# Patient Record
Sex: Female | Born: 1985
Health system: Southern US, Community
[De-identification: ages and names within clinical notes are randomized; demographics above are authoritative.]

## PROBLEM LIST (undated history)

## (undated) DIAGNOSIS — M2550 Pain in unspecified joint: Secondary | ICD-10-CM

## (undated) DIAGNOSIS — E669 Obesity, unspecified: Secondary | ICD-10-CM

## (undated) DIAGNOSIS — F329 Major depressive disorder, single episode, unspecified: Secondary | ICD-10-CM

## (undated) DIAGNOSIS — M7989 Other specified soft tissue disorders: Secondary | ICD-10-CM

## (undated) DIAGNOSIS — J302 Other seasonal allergic rhinitis: Secondary | ICD-10-CM

## (undated) DIAGNOSIS — J45909 Unspecified asthma, uncomplicated: Secondary | ICD-10-CM

## (undated) DIAGNOSIS — G4730 Sleep apnea, unspecified: Secondary | ICD-10-CM

## (undated) DIAGNOSIS — F419 Anxiety disorder, unspecified: Secondary | ICD-10-CM

## (undated) DIAGNOSIS — G43909 Migraine, unspecified, not intractable, without status migrainosus: Secondary | ICD-10-CM

## (undated) DIAGNOSIS — M549 Dorsalgia, unspecified: Secondary | ICD-10-CM

## (undated) DIAGNOSIS — F32A Depression, unspecified: Secondary | ICD-10-CM

## (undated) DIAGNOSIS — Z9289 Personal history of other medical treatment: Secondary | ICD-10-CM

## (undated) DIAGNOSIS — E559 Vitamin D deficiency, unspecified: Secondary | ICD-10-CM

## (undated) DIAGNOSIS — O021 Missed abortion: Secondary | ICD-10-CM

## (undated) DIAGNOSIS — D649 Anemia, unspecified: Secondary | ICD-10-CM

## (undated) HISTORY — DX: Pain in unspecified joint: M25.50

## (undated) HISTORY — DX: Vitamin D deficiency, unspecified: E55.9

## (undated) HISTORY — DX: Anxiety disorder, unspecified: F41.9

## (undated) HISTORY — DX: Other specified soft tissue disorders: M79.89

## (undated) HISTORY — DX: Obesity, unspecified: E66.9

## (undated) HISTORY — PX: WISDOM TOOTH EXTRACTION: SHX21

## (undated) HISTORY — DX: Dorsalgia, unspecified: M54.9

---

## 2011-04-13 ENCOUNTER — Inpatient Hospital Stay (HOSPITAL_COMMUNITY): Admission: AD | Admit: 2011-04-13 | Payer: Self-pay | Admitting: Obstetrics and Gynecology

## 2011-05-14 DIAGNOSIS — Z9289 Personal history of other medical treatment: Secondary | ICD-10-CM

## 2011-05-14 HISTORY — DX: Personal history of other medical treatment: Z92.89

## 2011-05-28 ENCOUNTER — Inpatient Hospital Stay (HOSPITAL_COMMUNITY)
Admission: AD | Admit: 2011-05-28 | Discharge: 2011-06-01 | DRG: 775 | Disposition: A | Discharge status: Home / Self Care | Payer: PRIVATE HEALTH INSURANCE | Source: Ambulatory Visit | Attending: Obstetrics and Gynecology | Admitting: Obstetrics and Gynecology

## 2011-05-28 ENCOUNTER — Encounter (HOSPITAL_COMMUNITY): Payer: Self-pay | Admitting: *Deleted

## 2011-05-28 DIAGNOSIS — O3660X0 Maternal care for excessive fetal growth, unspecified trimester, not applicable or unspecified: Secondary | ICD-10-CM | POA: Diagnosis present

## 2011-05-28 DIAGNOSIS — O9903 Anemia complicating the puerperium: Secondary | ICD-10-CM | POA: Diagnosis not present

## 2011-05-28 DIAGNOSIS — D62 Acute posthemorrhagic anemia: Secondary | ICD-10-CM | POA: Diagnosis not present

## 2011-05-28 DIAGNOSIS — O409XX0 Polyhydramnios, unspecified trimester, not applicable or unspecified: Secondary | ICD-10-CM | POA: Diagnosis present

## 2011-05-28 DIAGNOSIS — O139 Gestational [pregnancy-induced] hypertension without significant proteinuria, unspecified trimester: Principal | ICD-10-CM | POA: Diagnosis present

## 2011-05-28 LAB — CBC
Hemoglobin: 13.7 g/dL (ref 12.0–15.0)
MCH: 30 pg (ref 26.0–34.0)
MCHC: 33.8 g/dL (ref 30.0–36.0)
Platelets: 178 10*3/uL (ref 150–400)

## 2011-05-29 LAB — COMPREHENSIVE METABOLIC PANEL
ALT: 12 U/L (ref 0–35)
Calcium: 9.7 mg/dL (ref 8.4–10.5)
Creatinine, Ser: <0.47 mg/dL — ABNORMAL LOW (ref 0.50–1.10)
Glucose, Bld: 74 mg/dL (ref 70–99)
Sodium: 135 mEq/L (ref 135–145)
Total Protein: 6.1 g/dL (ref 6.0–8.3)

## 2011-05-29 LAB — CBC
MCV: 89.1 fL (ref 78.0–100.0)
Platelets: 169 10*3/uL (ref 150–400)
RBC: 4.41 MIL/uL (ref 3.87–5.11)
WBC: 14.1 10*3/uL — ABNORMAL HIGH (ref 4.0–10.5)

## 2011-05-29 LAB — RPR: RPR Ser Ql: NONREACTIVE

## 2011-05-30 LAB — CBC
HCT: 29.9 % — ABNORMAL LOW (ref 36.0–46.0)
Hemoglobin: 10 g/dL — ABNORMAL LOW (ref 12.0–15.0)
Hemoglobin: 8.7 g/dL — ABNORMAL LOW (ref 12.0–15.0)
MCH: 30.3 pg (ref 26.0–34.0)
MCV: 88.9 fL (ref 78.0–100.0)
Platelets: 149 10*3/uL — ABNORMAL LOW (ref 150–400)
RBC: 3.34 MIL/uL — ABNORMAL LOW (ref 3.87–5.11)
RBC: 2.87 MIL/uL — ABNORMAL LOW (ref 3.87–5.11)
WBC: 18 10*3/uL — ABNORMAL HIGH (ref 4.0–10.5)
WBC: 19 10*3/uL — ABNORMAL HIGH (ref 4.0–10.5)

## 2011-05-31 LAB — CBC
HCT: 19.9 % — ABNORMAL LOW (ref 36.0–46.0)
Hemoglobin: 6.8 g/dL — CL (ref 12.0–15.0)
Hemoglobin: 9 g/dL — ABNORMAL LOW (ref 12.0–15.0)
MCH: 30.7 pg (ref 26.0–34.0)
MCHC: 34.2 g/dL (ref 30.0–36.0)
MCHC: 34.2 g/dL (ref 30.0–36.0)
MCV: 89.8 fL (ref 78.0–100.0)
MCV: 90 fL (ref 78.0–100.0)
RBC: 2.93 MIL/uL — ABNORMAL LOW (ref 3.87–5.11)
RDW: 14 % (ref 11.5–15.5)
WBC: 14.2 10*3/uL — ABNORMAL HIGH (ref 4.0–10.5)

## 2011-06-01 LAB — CBC
HCT: 24.8 % — ABNORMAL LOW (ref 36.0–46.0)
MCHC: 33.5 g/dL (ref 30.0–36.0)
MCV: 90.8 fL (ref 78.0–100.0)
Platelets: 143 10*3/uL — ABNORMAL LOW (ref 150–400)
RDW: 14.4 % (ref 11.5–15.5)
WBC: 10.8 10*3/uL — ABNORMAL HIGH (ref 4.0–10.5)

## 2011-06-01 LAB — TYPE AND SCREEN
ABO/RH(D): O POS
Antibody Screen: NEGATIVE
Unit division: 0
Unit division: 0

## 2011-06-08 ENCOUNTER — Inpatient Hospital Stay (HOSPITAL_COMMUNITY): Admission: AD | Admit: 2011-06-08 | Payer: Self-pay | Source: Ambulatory Visit | Admitting: Obstetrics and Gynecology

## 2012-05-03 ENCOUNTER — Other Ambulatory Visit: Payer: Self-pay | Admitting: Physician Assistant

## 2012-12-13 DIAGNOSIS — O021 Missed abortion: Secondary | ICD-10-CM

## 2012-12-13 HISTORY — DX: Missed abortion: O02.1

## 2014-02-18 ENCOUNTER — Ambulatory Visit: Payer: Self-pay | Admitting: Family Medicine

## 2014-02-27 LAB — OB RESULTS CONSOLE HEPATITIS B SURFACE ANTIGEN
Hepatitis B Surface Ag: NEGATIVE
Hepatitis B Surface Ag: NEGATIVE

## 2014-02-27 LAB — OB RESULTS CONSOLE HIV ANTIBODY (ROUTINE TESTING)
HIV: NONREACTIVE
HIV: NONREACTIVE
HIV: NONREACTIVE

## 2014-02-27 LAB — OB RESULTS CONSOLE ANTIBODY SCREEN: ANTIBODY SCREEN: NEGATIVE

## 2014-02-27 LAB — OB RESULTS CONSOLE GC/CHLAMYDIA
CHLAMYDIA, DNA PROBE: NEGATIVE
GC PROBE AMP, GENITAL: NEGATIVE

## 2014-02-27 LAB — OB RESULTS CONSOLE ABO/RH: RH Type: POSITIVE

## 2014-02-27 LAB — OB RESULTS CONSOLE RPR
RPR: NONREACTIVE
RPR: NONREACTIVE

## 2014-02-27 LAB — OB RESULTS CONSOLE RUBELLA ANTIBODY, IGM: RUBELLA: IMMUNE

## 2014-09-09 ENCOUNTER — Other Ambulatory Visit: Payer: Self-pay | Admitting: Obstetrics and Gynecology

## 2014-09-23 ENCOUNTER — Encounter (HOSPITAL_COMMUNITY): Payer: Self-pay | Admitting: Pharmacist

## 2014-09-24 NOTE — Patient Instructions (Addendum)
   Your procedure is scheduled on:  Friday, Oct 16  [facility] at:  1130 AM Pick up the phone at the desk and dial [phone] and inform us of your arrival.  Please call this number if you have any problems the morning of surgery: [phone]  Remember: Do not eat food after midnight: tonight Do not drink clear liquids after: 9 AM Friday, day of surgery Take these medicines the morning of surgery with a SIP OF WATER:  Wellbutrin.  Bring albuterol inhaler with you on day of surgery.  Do not wear jewelry, make-up, or FINGER nail polish No metal in your hair or on your body. Do not wear lotions, powders, perfumes.  You may wear deodorant.  Do not bring valuables to the hospital. Contacts, dentures or bridgework may not be worn into surgery.  Leave suitcase in the car. After Surgery it may be brought to your room. For patients being admitted to the hospital, checkout time is 11:00am the day of discharge.  Home with husband Mardelle Mattendy cell [phone]

## 2014-09-26 ENCOUNTER — Encounter (HOSPITAL_COMMUNITY)
Admission: RE | Admit: 2014-09-26 | Discharge: 2014-09-26 | Disposition: A | Discharge status: Home / Self Care | Payer: 59 | Source: Ambulatory Visit | Attending: Obstetrics and Gynecology | Admitting: Obstetrics and Gynecology

## 2014-09-26 ENCOUNTER — Encounter (HOSPITAL_COMMUNITY): Payer: Self-pay

## 2014-09-26 HISTORY — DX: Personal history of other medical treatment: Z92.89

## 2014-09-26 HISTORY — DX: Missed abortion: O02.1

## 2014-09-26 HISTORY — DX: Depression, unspecified: F32.A

## 2014-09-26 HISTORY — DX: Anemia, unspecified: D64.9

## 2014-09-26 HISTORY — DX: Major depressive disorder, single episode, unspecified: F32.9

## 2014-09-26 HISTORY — DX: Unspecified asthma, uncomplicated: J45.909

## 2014-09-26 HISTORY — DX: Other seasonal allergic rhinitis: J30.2

## 2014-09-26 LAB — CBC
HCT: 33.9 % — ABNORMAL LOW (ref 36.0–46.0)
Hemoglobin: 10.4 g/dL — ABNORMAL LOW (ref 12.0–15.0)
MCH: 24.7 pg — ABNORMAL LOW (ref 26.0–34.0)
MCHC: 30.7 g/dL (ref 30.0–36.0)
MCV: 80.5 fL (ref 78.0–100.0)
PLATELETS: 221 10*3/uL (ref 150–400)
RBC: 4.21 MIL/uL (ref 3.87–5.11)
RDW: 15.8 % — ABNORMAL HIGH (ref 11.5–15.5)
WBC: 13 10*3/uL — ABNORMAL HIGH (ref 4.0–10.5)

## 2014-09-26 NOTE — Pre-Procedure Instructions (Signed)
SDS BB History Log given to Lab for patient's previous blood transfusion in 2012 per patient.

## 2014-09-27 ENCOUNTER — Encounter (HOSPITAL_COMMUNITY)
Admission: AD | Disposition: A | Discharge status: Home / Self Care | Payer: Self-pay | Source: Ambulatory Visit | Attending: Obstetrics and Gynecology

## 2014-09-27 ENCOUNTER — Encounter (HOSPITAL_COMMUNITY): Payer: 59 | Admitting: Anesthesiology

## 2014-09-27 ENCOUNTER — Inpatient Hospital Stay (HOSPITAL_COMMUNITY)
Admission: AD | Admit: 2014-09-27 | Discharge: 2014-09-29 | DRG: 765 | Disposition: A | Discharge status: Home / Self Care | Payer: 59 | Source: Ambulatory Visit | Attending: Obstetrics and Gynecology | Admitting: Obstetrics and Gynecology

## 2014-09-27 ENCOUNTER — Inpatient Hospital Stay (HOSPITAL_COMMUNITY): Payer: 59 | Admitting: Anesthesiology

## 2014-09-27 ENCOUNTER — Encounter (HOSPITAL_COMMUNITY): Payer: Self-pay | Admitting: Anesthesiology

## 2014-09-27 DIAGNOSIS — Z6841 Body Mass Index (BMI) 40.0 and over, adult: Secondary | ICD-10-CM

## 2014-09-27 DIAGNOSIS — O9952 Diseases of the respiratory system complicating childbirth: Secondary | ICD-10-CM | POA: Diagnosis present

## 2014-09-27 DIAGNOSIS — F329 Major depressive disorder, single episode, unspecified: Secondary | ICD-10-CM | POA: Diagnosis present

## 2014-09-27 DIAGNOSIS — J45909 Unspecified asthma, uncomplicated: Secondary | ICD-10-CM | POA: Diagnosis present

## 2014-09-27 DIAGNOSIS — O99214 Obesity complicating childbirth: Secondary | ICD-10-CM | POA: Diagnosis present

## 2014-09-27 DIAGNOSIS — Z3A39 39 weeks gestation of pregnancy: Secondary | ICD-10-CM | POA: Diagnosis present

## 2014-09-27 DIAGNOSIS — O99344 Other mental disorders complicating childbirth: Secondary | ICD-10-CM | POA: Diagnosis present

## 2014-09-27 DIAGNOSIS — O3663X0 Maternal care for excessive fetal growth, third trimester, not applicable or unspecified: Principal | ICD-10-CM | POA: Diagnosis present

## 2014-09-27 DIAGNOSIS — O9081 Anemia of the puerperium: Secondary | ICD-10-CM | POA: Diagnosis not present

## 2014-09-27 DIAGNOSIS — D62 Acute posthemorrhagic anemia: Secondary | ICD-10-CM | POA: Diagnosis not present

## 2014-09-27 DIAGNOSIS — O09893 Supervision of other high risk pregnancies, third trimester: Secondary | ICD-10-CM | POA: Diagnosis not present

## 2014-09-27 LAB — PREPARE RBC (CROSSMATCH)

## 2014-09-27 LAB — RPR

## 2014-09-27 SURGERY — Surgical Case
Anesthesia: Spinal | Site: Abdomen

## 2014-09-27 MED ORDER — OXYTOCIN 10 UNIT/ML IJ SOLN
INTRAMUSCULAR | Status: AC
  Filled 2014-09-27: qty 4

## 2014-09-27 MED ORDER — NALBUPHINE HCL 10 MG/ML IJ SOLN: 5.0000 mg | Freq: Once | INTRAMUSCULAR | Status: AC | PRN

## 2014-09-27 MED ORDER — LACTATED RINGERS IV SOLN
Freq: Once | INTRAVENOUS | Status: AC
  Administered 2014-09-27: 12:00:00 via INTRAVENOUS

## 2014-09-27 MED ORDER — ONDANSETRON HCL 4 MG/2ML IJ SOLN
INTRAMUSCULAR | Status: DC | PRN
  Administered 2014-09-27: 4 mg via INTRAVENOUS

## 2014-09-27 MED ORDER — KETOROLAC TROMETHAMINE 30 MG/ML IJ SOLN
15.0000 mg | Freq: Once | INTRAMUSCULAR | Status: AC | PRN
  Administered 2014-09-27: 30 mg via INTRAVENOUS

## 2014-09-27 MED ORDER — NALBUPHINE HCL 10 MG/ML IJ SOLN: 5.0000 mg | INTRAMUSCULAR | Status: DC | PRN

## 2014-09-27 MED ORDER — DIPHENHYDRAMINE HCL 25 MG PO CAPS
25.0000 mg | ORAL_CAPSULE | ORAL | Status: DC | PRN
  Administered 2014-09-28: 25 mg via ORAL
  Filled 2014-09-27: qty 1

## 2014-09-27 MED ORDER — NALOXONE HCL 1 MG/ML IJ SOLN
1.0000 ug/kg/h | INTRAVENOUS | Status: DC | PRN
  Filled 2014-09-27: qty 2

## 2014-09-27 MED ORDER — DIBUCAINE 1 % RE OINT: 1.0000 "application " | TOPICAL_OINTMENT | RECTAL | Status: DC | PRN

## 2014-09-27 MED ORDER — SIMETHICONE 80 MG PO CHEW
80.0000 mg | CHEWABLE_TABLET | Freq: Three times a day (TID) | ORAL | Status: DC
  Administered 2014-09-28 – 2014-09-29 (×5): 80 mg via ORAL
  Filled 2014-09-27 (×5): qty 1

## 2014-09-27 MED ORDER — ONDANSETRON HCL 4 MG/2ML IJ SOLN: 4.0000 mg | INTRAMUSCULAR | Status: DC | PRN

## 2014-09-27 MED ORDER — IBUPROFEN 600 MG PO TABS
600.0000 mg | ORAL_TABLET | Freq: Four times a day (QID) | ORAL | Status: DC
  Administered 2014-09-28 – 2014-09-29 (×6): 600 mg via ORAL
  Filled 2014-09-27 (×7): qty 1

## 2014-09-27 MED ORDER — SODIUM CHLORIDE 0.9 % IJ SOLN: 3.0000 mL | Freq: Two times a day (BID) | INTRAMUSCULAR | Status: DC

## 2014-09-27 MED ORDER — SODIUM CHLORIDE 0.9 % IJ SOLN: 3.0000 mL | INTRAMUSCULAR | Status: DC | PRN

## 2014-09-27 MED ORDER — DIPHENHYDRAMINE HCL 25 MG PO CAPS: 25.0000 mg | ORAL_CAPSULE | Freq: Four times a day (QID) | ORAL | Status: DC | PRN

## 2014-09-27 MED ORDER — LANOLIN HYDROUS EX OINT: 1.0000 "application " | TOPICAL_OINTMENT | CUTANEOUS | Status: DC | PRN

## 2014-09-27 MED ORDER — SIMETHICONE 80 MG PO CHEW: 80.0000 mg | CHEWABLE_TABLET | ORAL | Status: DC | PRN

## 2014-09-27 MED ORDER — ONDANSETRON HCL 4 MG/2ML IJ SOLN
INTRAMUSCULAR | Status: AC
  Filled 2014-09-27: qty 2

## 2014-09-27 MED ORDER — ONDANSETRON HCL 4 MG/2ML IJ SOLN: 4.0000 mg | Freq: Three times a day (TID) | INTRAMUSCULAR | Status: DC | PRN

## 2014-09-27 MED ORDER — NALBUPHINE HCL 10 MG/ML IJ SOLN
5.0000 mg | INTRAMUSCULAR | Status: DC | PRN
Start: 2014-09-27 — End: 2014-09-28
  Administered 2014-09-27: 5 mg via INTRAVENOUS
  Filled 2014-09-27: qty 1

## 2014-09-27 MED ORDER — BISACODYL 10 MG RE SUPP: 10.0000 mg | Freq: Every day | RECTAL | Status: DC | PRN

## 2014-09-27 MED ORDER — METHYLERGONOVINE MALEATE 0.2 MG/ML IJ SOLN: 0.2000 mg | INTRAMUSCULAR | Status: DC | PRN

## 2014-09-27 MED ORDER — PHENYLEPHRINE 8 MG IN D5W 100 ML (0.08MG/ML) PREMIX OPTIME
INJECTION | INTRAVENOUS | Status: DC | PRN
  Administered 2014-09-27: 60 ug/min via INTRAVENOUS

## 2014-09-27 MED ORDER — 0.9 % SODIUM CHLORIDE (POUR BTL) OPTIME
TOPICAL | Status: DC | PRN
  Administered 2014-09-27: 1000 mL

## 2014-09-27 MED ORDER — HYDROMORPHONE HCL 1 MG/ML IJ SOLN
INTRAMUSCULAR | Status: AC
  Administered 2014-09-27: 0.5 mg via INTRAVENOUS
  Filled 2014-09-27: qty 1

## 2014-09-27 MED ORDER — HYDROMORPHONE HCL 1 MG/ML IJ SOLN
0.2500 mg | INTRAMUSCULAR | Status: DC | PRN
  Administered 2014-09-27: 0.5 mg via INTRAVENOUS

## 2014-09-27 MED ORDER — KETOROLAC TROMETHAMINE 30 MG/ML IJ SOLN
INTRAMUSCULAR | Status: AC
  Administered 2014-09-27: 30 mg via INTRAVENOUS
  Filled 2014-09-27: qty 1

## 2014-09-27 MED ORDER — OXYCODONE-ACETAMINOPHEN 5-325 MG PO TABS: 2.0000 | ORAL_TABLET | ORAL | Status: DC | PRN

## 2014-09-27 MED ORDER — PRENATAL MULTIVITAMIN CH
1.0000 | ORAL_TABLET | Freq: Every day | ORAL | Status: DC
  Administered 2014-09-28 – 2014-09-29 (×2): 1 via ORAL
  Filled 2014-09-27 (×2): qty 1

## 2014-09-27 MED ORDER — LACTATED RINGERS IV SOLN
INTRAVENOUS | Status: DC | PRN
  Administered 2014-09-27 (×3): via INTRAVENOUS

## 2014-09-27 MED ORDER — DEXTROSE 5 % IV SOLN
3.0000 g | INTRAVENOUS | Status: DC | PRN
  Administered 2014-09-27: 3 g via INTRAVENOUS

## 2014-09-27 MED ORDER — FENTANYL CITRATE 0.05 MG/ML IJ SOLN
INTRAMUSCULAR | Status: AC
  Filled 2014-09-27: qty 2

## 2014-09-27 MED ORDER — SIMETHICONE 80 MG PO CHEW
80.0000 mg | CHEWABLE_TABLET | ORAL | Status: DC
  Administered 2014-09-28 (×2): 80 mg via ORAL
  Filled 2014-09-27 (×2): qty 1

## 2014-09-27 MED ORDER — SCOPOLAMINE 1 MG/3DAYS TD PT72
1.0000 | MEDICATED_PATCH | Freq: Once | TRANSDERMAL | Status: DC
  Administered 2014-09-27: 1.5 mg via TRANSDERMAL

## 2014-09-27 MED ORDER — METHYLERGONOVINE MALEATE 0.2 MG PO TABS: 0.2000 mg | ORAL_TABLET | ORAL | Status: DC | PRN

## 2014-09-27 MED ORDER — OXYTOCIN 40 UNITS IN LACTATED RINGERS INFUSION - SIMPLE MED: 62.5000 mL/h | INTRAVENOUS | Status: AC

## 2014-09-27 MED ORDER — KETOROLAC TROMETHAMINE 30 MG/ML IJ SOLN: 30.0000 mg | Freq: Four times a day (QID) | INTRAMUSCULAR | Status: DC | PRN

## 2014-09-27 MED ORDER — BUPIVACAINE HCL (PF) 0.25 % IJ SOLN
INTRAMUSCULAR | Status: AC
  Filled 2014-09-27: qty 30

## 2014-09-27 MED ORDER — BUPIVACAINE HCL (PF) 0.25 % IJ SOLN
INTRAMUSCULAR | Status: DC | PRN
  Administered 2014-09-27: 10 mL

## 2014-09-27 MED ORDER — SODIUM CHLORIDE 0.9 % IV SOLN: 250.0000 mL | INTRAVENOUS | Status: DC

## 2014-09-27 MED ORDER — NALOXONE HCL 0.4 MG/ML IJ SOLN: 0.4000 mg | INTRAMUSCULAR | Status: DC | PRN

## 2014-09-27 MED ORDER — OXYCODONE-ACETAMINOPHEN 5-325 MG PO TABS
1.0000 | ORAL_TABLET | ORAL | Status: DC | PRN
  Administered 2014-09-28 – 2014-09-29 (×2): 1 via ORAL
  Filled 2014-09-27 (×2): qty 1

## 2014-09-27 MED ORDER — FERROUS SULFATE 325 (65 FE) MG PO TABS
325.0000 mg | ORAL_TABLET | Freq: Two times a day (BID) | ORAL | Status: DC
  Administered 2014-09-28 – 2014-09-29 (×3): 325 mg via ORAL
  Filled 2014-09-27 (×3): qty 1

## 2014-09-27 MED ORDER — LACTATED RINGERS IV SOLN
INTRAVENOUS | Status: DC | PRN
  Administered 2014-09-27: 14:00:00 via INTRAVENOUS

## 2014-09-27 MED ORDER — MEPERIDINE HCL 25 MG/ML IJ SOLN: 6.2500 mg | INTRAMUSCULAR | Status: DC | PRN

## 2014-09-27 MED ORDER — ALBUTEROL SULFATE (2.5 MG/3ML) 0.083% IN NEBU: 3.0000 mL | INHALATION_SOLUTION | Freq: Four times a day (QID) | RESPIRATORY_TRACT | Status: DC | PRN

## 2014-09-27 MED ORDER — FLEET ENEMA 7-19 GM/118ML RE ENEM: 1.0000 | ENEMA | Freq: Every day | RECTAL | Status: DC | PRN

## 2014-09-27 MED ORDER — ZOLPIDEM TARTRATE 5 MG PO TABS: 5.0000 mg | ORAL_TABLET | Freq: Every evening | ORAL | Status: DC | PRN

## 2014-09-27 MED ORDER — WITCH HAZEL-GLYCERIN EX PADS
1.0000 | MEDICATED_PAD | CUTANEOUS | Status: DC | PRN
Start: 2014-09-27 — End: 2014-09-29

## 2014-09-27 MED ORDER — MORPHINE SULFATE (PF) 0.5 MG/ML IJ SOLN
INTRAMUSCULAR | Status: DC | PRN
  Administered 2014-09-27: .15 mg via INTRATHECAL

## 2014-09-27 MED ORDER — PHENYLEPHRINE HCL 10 MG/ML IJ SOLN
INTRAMUSCULAR | Status: AC
  Filled 2014-09-27: qty 1

## 2014-09-27 MED ORDER — PROMETHAZINE HCL 25 MG/ML IJ SOLN
INTRAMUSCULAR | Status: AC
  Filled 2014-09-27: qty 1

## 2014-09-27 MED ORDER — MENTHOL 3 MG MT LOZG: 1.0000 | LOZENGE | OROMUCOSAL | Status: DC | PRN

## 2014-09-27 MED ORDER — IBUPROFEN 600 MG PO TABS: 600.0000 mg | ORAL_TABLET | Freq: Four times a day (QID) | ORAL | Status: DC | PRN

## 2014-09-27 MED ORDER — ONDANSETRON HCL 4 MG PO TABS: 4.0000 mg | ORAL_TABLET | ORAL | Status: DC | PRN

## 2014-09-27 MED ORDER — FENTANYL CITRATE 0.05 MG/ML IJ SOLN
INTRAMUSCULAR | Status: DC | PRN
  Administered 2014-09-27: 25 ug via INTRATHECAL

## 2014-09-27 MED ORDER — DIPHENHYDRAMINE HCL 50 MG/ML IJ SOLN: 12.5000 mg | INTRAMUSCULAR | Status: DC | PRN

## 2014-09-27 MED ORDER — DEXTROSE 5 % IV SOLN
3.0000 g | INTRAVENOUS | Status: DC
  Filled 2014-09-27: qty 3000

## 2014-09-27 MED ORDER — OXYTOCIN 40 UNITS IN LACTATED RINGERS INFUSION - SIMPLE MED
INTRAVENOUS | Status: DC | PRN
  Administered 2014-09-27: 40 [IU] via INTRAVENOUS

## 2014-09-27 MED ORDER — SENNOSIDES-DOCUSATE SODIUM 8.6-50 MG PO TABS
2.0000 | ORAL_TABLET | ORAL | Status: DC
  Administered 2014-09-28: 2 via ORAL
  Filled 2014-09-27 (×2): qty 2

## 2014-09-27 MED ORDER — DEXTROSE IN LACTATED RINGERS 5 % IV SOLN
INTRAVENOUS | Status: DC
  Administered 2014-09-27: 18:00:00 via INTRAVENOUS
  Administered 2014-09-28: 1000 mL via INTRAVENOUS

## 2014-09-27 MED ORDER — BUPROPION HCL ER (XL) 300 MG PO TB24
300.0000 mg | ORAL_TABLET | Freq: Every day | ORAL | Status: DC
  Administered 2014-09-28 – 2014-09-29 (×2): 300 mg via ORAL
  Filled 2014-09-27 (×3): qty 1

## 2014-09-27 MED ORDER — MORPHINE SULFATE 0.5 MG/ML IJ SOLN
INTRAMUSCULAR | Status: AC
  Filled 2014-09-27: qty 10

## 2014-09-27 MED ORDER — SCOPOLAMINE 1 MG/3DAYS TD PT72
MEDICATED_PATCH | TRANSDERMAL | Status: AC
  Administered 2014-09-27: 1.5 mg via TRANSDERMAL
  Filled 2014-09-27: qty 1

## 2014-09-27 MED ORDER — KETOROLAC TROMETHAMINE 30 MG/ML IJ SOLN
30.0000 mg | Freq: Four times a day (QID) | INTRAMUSCULAR | Status: DC | PRN
Start: 2014-09-27 — End: 2014-09-27

## 2014-09-27 MED ORDER — PROMETHAZINE HCL 25 MG/ML IJ SOLN
6.2500 mg | INTRAMUSCULAR | Status: DC | PRN
  Administered 2014-09-27: 12.5 mg via INTRAVENOUS

## 2014-09-27 MED ORDER — BUPIVACAINE IN DEXTROSE 0.75-8.25 % IT SOLN
INTRATHECAL | Status: DC | PRN
  Administered 2014-09-27: 1.5 mL via INTRATHECAL

## 2014-09-27 SURGICAL SUPPLY — 42 items
BARRIER ADHS 3X4 INTERCEED (GAUZE/BANDAGES/DRESSINGS) ×3 IMPLANT
BENZOIN TINCTURE PRP APPL 2/3 (GAUZE/BANDAGES/DRESSINGS) IMPLANT
CLAMP CORD UMBIL (MISCELLANEOUS) ×3 IMPLANT
CLOSURE WOUND 1/2 X4 (GAUZE/BANDAGES/DRESSINGS)
CLOTH BEACON ORANGE TIMEOUT ST (SAFETY) ×3 IMPLANT
CONTAINER PREFILL 10% NBF 15ML (MISCELLANEOUS) IMPLANT
DRAPE SHEET LG 3/4 BI-LAMINATE (DRAPES) ×3 IMPLANT
DRSG OPSITE POSTOP 4X10 (GAUZE/BANDAGES/DRESSINGS) ×3 IMPLANT
DURAPREP 26ML APPLICATOR (WOUND CARE) ×3 IMPLANT
ELECT REM PT RETURN 9FT ADLT (ELECTROSURGICAL) ×3
ELECTRODE REM PT RTRN 9FT ADLT (ELECTROSURGICAL) ×1 IMPLANT
EXTRACTOR VACUUM M CUP 4 TUBE (SUCTIONS) IMPLANT
EXTRACTOR VACUUM M CUP 4' TUBE (SUCTIONS)
GLOVE BIOGEL PI IND STRL 7.0 (GLOVE) ×1 IMPLANT
GLOVE BIOGEL PI INDICATOR 7.0 (GLOVE) ×2
GLOVE ECLIPSE 6.5 STRL STRAW (GLOVE) ×3 IMPLANT
GOWN STRL REUS W/TWL LRG LVL3 (GOWN DISPOSABLE) ×6 IMPLANT
KIT ABG SYR 3ML LUER SLIP (SYRINGE) IMPLANT
NEEDLE HYPO 25X1 1.5 SAFETY (NEEDLE) ×3 IMPLANT
NEEDLE HYPO 25X5/8 SAFETYGLIDE (NEEDLE) IMPLANT
NS IRRIG 1000ML POUR BTL (IV SOLUTION) ×3 IMPLANT
PACK C SECTION WH (CUSTOM PROCEDURE TRAY) ×3 IMPLANT
PAD OB MATERNITY 4.3X12.25 (PERSONAL CARE ITEMS) ×3 IMPLANT
RTRCTR C-SECT PINK 25CM LRG (MISCELLANEOUS) ×3 IMPLANT
SPONGE LAP 4X18 X RAY DECT (DISPOSABLE) ×3 IMPLANT
STAPLER VISISTAT 35W (STAPLE) ×3 IMPLANT
STRIP CLOSURE SKIN 1/2X4 (GAUZE/BANDAGES/DRESSINGS) IMPLANT
SUT CHROMIC GUT AB #0 18 (SUTURE) IMPLANT
SUT MNCRL 0 VIOLET CTX 36 (SUTURE) ×5 IMPLANT
SUT MON AB 4-0 PS1 27 (SUTURE) IMPLANT
SUT MONOCRYL 0 CTX 36 (SUTURE) ×10
SUT PLAIN 2 0 (SUTURE)
SUT PLAIN 2 0 XLH (SUTURE) ×3 IMPLANT
SUT PLAIN ABS 2-0 CT1 27XMFL (SUTURE) IMPLANT
SUT VIC AB 0 CT1 36 (SUTURE) ×6 IMPLANT
SUT VIC AB 2-0 CT1 27 (SUTURE) ×2
SUT VIC AB 2-0 CT1 TAPERPNT 27 (SUTURE) ×1 IMPLANT
SUT VIC AB 4-0 PS2 27 (SUTURE) IMPLANT
SYR CONTROL 10ML LL (SYRINGE) ×3 IMPLANT
TOWEL OR 17X24 6PK STRL BLUE (TOWEL DISPOSABLE) ×3 IMPLANT
TRAY FOLEY CATH 14FR (SET/KITS/TRAYS/PACK) ×3 IMPLANT
WATER STERILE IRR 1000ML POUR (IV SOLUTION) IMPLANT

## 2014-09-27 NOTE — Transfer of Care (Signed)
Immediate Anesthesia Transfer of Care Note  Patient: Kim Garcia  Procedure(s) Performed: Procedure(s) with comments: Primary CESAREAN SECTION (N/A) - EDD: 10/04/14  Patient Location: PACU  Anesthesia Type:Spinal  Level of Consciousness: awake, alert  and oriented  Airway & Oxygen Therapy: Patient Spontanous Breathing  Post-op Assessment: Report given to PACU RN and Post -op Vital signs reviewed and stable  Post vital signs: Reviewed and stable  Complications: No apparent anesthesia complications

## 2014-09-27 NOTE — Anesthesia Procedure Notes (Signed)
Spinal  Patient location during procedure: OR Start time: 09/27/2014 1:25 PM Staffing Anesthesiologist: Brayton CavesJACKSON, Reisa Coppola Performed by: anesthesiologist  Preanesthetic Checklist Completed: patient identified, site marked, surgical consent, pre-op evaluation, timeout performed, IV checked, risks and benefits discussed and monitors and equipment checked Spinal Block Patient position: sitting Prep: DuraPrep Patient monitoring: heart rate, cardiac monitor, continuous pulse ox and blood pressure Approach: midline Location: L3-4 Injection technique: single-shot Needle Needle type: Sprotte  Needle gauge: 24 G Needle length: 9 cm Assessment Sensory level: T4 Additional Notes Patient identified.  Risk benefits discussed including failed block, incomplete pain control, headache, nerve damage, paralysis, blood pressure changes, nausea, vomiting, reactions to medication both toxic or allergic, and postpartum back pain.  Patient expressed understanding and wished to proceed.  All questions were answered.  Sterile technique used throughout procedure.  CSF was clear.  No parasthesia or other complications.  Please see nursing notes for vital signs.

## 2014-09-27 NOTE — Lactation Note (Signed)
This note was copied from the chart of Kim Garcia Schou. Lactation Consultation Note Initial visit at 9 hours of age. Mom reports having difficulty with latching on right breast that is flat.  Hand pump used to evert nipple with colostrum visible.  Baby STS after bath and sleepy.  Baby did not wake for latch attempt.  [facility] given and discussed.  Encouraged to feed with early cues on demand.  Early newborn behavior discussed.  Mom to call for assist as needed.    Patient Name: Kim Garcia Somers ZOXWR'UToday's Date: 09/27/2014 Reason for consult: Initial assessment   Maternal Data Has patient been taught Hand Expression?: Yes Does the patient have breastfeeding experience prior to this delivery?: Yes  Feeding Feeding Type: Breast Fed Length of feed: 20 min  LATCH Score/Interventions Latch: Grasps breast easily, tongue down, lips flanged, rhythmical sucking.  Audible Swallowing: A few with stimulation Intervention(s): Skin to skin  Type of Nipple: Everted at rest and after stimulation  Comfort (Breast/Nipple): Soft / non-tender     Hold (Positioning): No assistance needed to correctly position infant at breast.  LATCH Score: 9  Lactation Tools Discussed/Used     Consult Status Consult Status: Follow-up Date: 09/28/14 Follow-up type: In-patient    Jannifer RodneyShoptaw, Chivon Lepage Lynn 09/27/2014, 11:03 PM

## 2014-09-27 NOTE — Anesthesia Postprocedure Evaluation (Signed)
  Anesthesia Post Note  Patient: Kim Garcia  Procedure(s) Performed: Procedure(s) (LRB): Primary CESAREAN SECTION (N/A)  Anesthesia type: Spinal  Patient location: PACU  Post pain: Pain level controlled  Post assessment: Post-op Vital signs reviewed  Last Vitals:  Filed Vitals:   09/27/14 1515  BP: 105/45  Pulse: 87  Temp:   Resp: 20    Post vital signs: Reviewed  Level of consciousness: awake  Complications: No apparent anesthesia complications

## 2014-09-27 NOTE — Brief Op Note (Signed)
09/27/2014  3:02 PM  PATIENT:  Kim Garcia  28 y.o. female  PRE-OPERATIVE DIAGNOSIS:  Previous 4th degree laceration, Large for Gestational Age  POST-OPERATIVE DIAGNOSIS:  Previous 4th degree laceration, fetal macrosomia, term gestation  PROCEDURE:  Primary Cesarean section, kerr hysterotomy  SURGEON:  Surgeon(s) and Role:    * Whitni Pasquini Cathie BeamsA Miklos Bidinger, MD - Primary  PHYSICIAN ASSISTANT:   ASSISTANTS: Donette LarryMelanie Bhambri, CNM   ANESTHESIA:   spinal FINDINGS: Live female 9lb 2 oz, nl tubes, polycystic ovaries EBL:  Total I/O In: 3200 [I.V.:3200] Out: 1200 [Urine:400; Blood:800]  BLOOD ADMINISTERED:none  DRAINS: none   LOCAL MEDICATIONS USED:  MARCAINE     SPECIMEN:  Source of Specimen:  placenta  DISPOSITION OF SPECIMEN:  N/A  COUNTS:  YES  TOURNIQUET:  * No tourniquets in log *  DICTATION: .Other Dictation: Dictation Number [id number]  PLAN OF CARE: Admit to inpatient   PATIENT DISPOSITION:  PACU - hemodynamically stable.   Delay start of Pharmacological VTE agent (>24hrs) due to surgical blood loss or risk of bleeding: no

## 2014-09-27 NOTE — Anesthesia Preprocedure Evaluation (Signed)
Anesthesia Evaluation  Patient identified by MRN, date of birth, ID band Patient awake    Reviewed: Allergy & Precautions, H&P , NPO status , Patient's Chart, lab work & pertinent test results  Airway Mallampati: III TM Distance: >3 FB Neck ROM: full    Dental no notable dental hx.    Pulmonary    Pulmonary exam normal       Cardiovascular negative cardio ROS      Neuro/Psych Depression negative neurological ROS     GI/Hepatic negative GI ROS, Neg liver ROS,   Endo/Other  negative endocrine ROSMorbid obesity  Renal/GU negative Renal ROS     Musculoskeletal   Abdominal (+) + obese,   Peds  Hematology   Anesthesia Other Findings   Reproductive/Obstetrics (+) Pregnancy                           Anesthesia Physical Anesthesia Plan  ASA: III  Anesthesia Plan: Spinal   Post-op Pain Management:    Induction:   Airway Management Planned:   Additional Equipment:   Intra-op Plan:   Post-operative Plan:   Informed Consent: I have reviewed the patients History and Physical, chart, labs and discussed the procedure including the risks, benefits and alternatives for the proposed anesthesia with the patient or authorized representative who has indicated his/her understanding and acceptance.     Plan Discussed with: CRNA, Anesthesiologist and Surgeon  Anesthesia Plan Comments:         Anesthesia Quick Evaluation

## 2014-09-28 ENCOUNTER — Encounter (HOSPITAL_COMMUNITY): Payer: Self-pay | Admitting: *Deleted

## 2014-09-28 LAB — CBC
HEMATOCRIT: 27.9 % — AB (ref 36.0–46.0)
HEMOGLOBIN: 8.5 g/dL — AB (ref 12.0–15.0)
MCH: 25.4 pg — AB (ref 26.0–34.0)
MCHC: 30.8 g/dL (ref 30.0–36.0)
MCV: 82.3 fL (ref 78.0–100.0)
Platelets: 171 10*3/uL (ref 150–400)
RBC: 3.39 MIL/uL — ABNORMAL LOW (ref 3.87–5.11)
RDW: 16 % — ABNORMAL HIGH (ref 11.5–15.5)
WBC: 11.2 10*3/uL — ABNORMAL HIGH (ref 4.0–10.5)

## 2014-09-28 LAB — BIRTH TISSUE RECOVERY COLLECTION (PLACENTA DONATION)

## 2014-09-28 LAB — CCBB MATERNAL DONOR DRAW

## 2014-09-28 MED ORDER — MAGNESIUM OXIDE 400 (241.3 MG) MG PO TABS
200.0000 mg | ORAL_TABLET | Freq: Every day | ORAL | Status: DC
  Administered 2014-09-28 – 2014-09-29 (×2): 200 mg via ORAL
  Filled 2014-09-28 (×3): qty 0.5

## 2014-09-28 NOTE — Addendum Note (Signed)
Addendum created 09/28/14 0814 by Renford DillsJanet L Austina Constantin, CRNA   Modules edited: Notes Section   Notes Section:  File: [id number]

## 2014-09-28 NOTE — Progress Notes (Signed)
S/p primary C/S POD #1   S: Feeling okay, but tired      Pain controlled with Motrin      No nausea, vomiting      Occasional dizziness when first getting out of bed       Up ad lib/ambulating in room/voiding adequately      Bleeding is light      No flatus yet, but feels gas pains      Breastfeeding  O:   Blood pressure 103/52, pulse 82, temperature 97.9 F (36.6 C), temperature source Oral, resp. rate 18, weight 116.121 kg (256 lb), last menstrual period 12/18/2013, SpO2 95.00%, unknown if currently breastfeeding.  Intake/Output: Net: +390  Rubella - Immune Flu - received Tdap- Received   Physical Exam: General: alert, oriented x 4 Heart: regular rate, rhythm Lungs: clear, equal bilaterally Abdomen: distended, tender to palpation, hypoactive bowel sounds Incision: honeycomb in place, dry, intact, small brown dried blood / nurse noted on dressing Fundus: firm, U-1 difficulty palpated due to habitus Perineum: intact Lochia: scant, bright red Extremities: +2 edema in BLE; no calf pain or tenderness or swelling  A: 1. S/p primary C/S - macrosomia - baby girl  2. ABL Anemia - on Ferrous Sulfate BID 3. Depression - Wellbutrin 300mg  daily 4. Obesity   P: Continue routine postpartum orders Begin Magnesium Oxide 200mg  daily Encouraged ambulating and IS  Discussed breastfeeding, hand massage and hand expression before latching  Milinda CaveMeredith Causey, SNM Marlinda Mikeanya Morry Veiga CNM / Asa LenteFACNM

## 2014-09-28 NOTE — Lactation Note (Signed)
This note was copied from the chart of Girl Jake SharkSara Mano. Lactation Consultation Note  Patient Name: Girl Jake SharkSara Hallett RUEAV'WToday's Date: 09/28/2014 Reason for consult: Follow-up assessment;Difficult latch (difficult latch on (R) breast)  Mom had been having latch difficulty on (R) and was provided with a #24 NS by her nurse, as well as a DEBP to use for additional stimulation and to help evert this nipple more prior to latch.  Mom has obtained about 10 ml's on (R) after breastfeeding and FOB will finger-feed ebm with curved-tip syringe.  Both parents verbalize understanding of plan and use of tools.  Mom will continue cue feedings on both breasts, wear shells between feedings, pre and post-pump as needed until baby latching well on both sides and no longer needing NS or supplement.  Maternal Data    Feeding Length of feed: 5 min  LATCH Score/Interventions Latch: Repeated attempts needed to sustain latch, nipple held in mouth throughout feeding, stimulation needed to elicit sucking reflex.  Audible Swallowing: A few with stimulation  Type of Nipple: Flat Intervention(s): Shells (NS)  Comfort (Breast/Nipple): Soft / non-tender     Hold (Positioning): Assistance needed to correctly position infant at breast and maintain latch.  LATCH Score: 6 (most recent LATCH score on difficult (R) breast, per RN assessment)  Lactation Tools Discussed/Used Tools: Nipple Shields Nipple shield size: 24 DEBP Milk storage (page 25 in Baby and Me) Cue feedings  Consult Status Consult Status: Follow-up Date: 09/29/14 Follow-up type: In-patient    Warrick ParisianBryant, Casie Sturgeon [facility] 09/28/2014, 8:47 PM

## 2014-09-28 NOTE — Op Note (Signed)
NAMJake Shark:  Garcia, Kim Garcia                   ACCOUNT NO.:  [id number]  MEDICAL RECORD NO.:  [id number]  LOCATION:  9141                          FACILITY:  [facility]  PHYSICIAN:  Kim Garcia, M.D.DATE OF BIRTH:  08-29-1986  DATE OF PROCEDURE:  09/27/2014 DATE OF DISCHARGE:                              OPERATIVE REPORT   PREOPERATIVE DIAGNOSIS:  Previous 4th degree laceration.  Large for gestational age baby.  Term gestation.  POSTOPERATIVE DIAGNOSIS:  Previous 4th degree laceration.  Fetal macrosomia.  Term gestation.  PROCEDURES:  Primary cesarean section, Kerr hysterotomy.  ANESTHESIA:  Spinal.  SURGEON:  Kim Garcia, M.D.  ASSISTANT:  Ricki MillerMaloney Bhambri, CNM.  DESCRIPTION OF PROCEDURE:  Under adequate spinal anesthesia, the patient was placed in the supine position with a left lateral tilt.  She was sterilely prepped and draped in usual fashion.  An indwelling Foley catheter was sterilely placed.  A 0.25% Marcaine was injected along the planned Pfannenstiel skin incision site.  Pfannenstiel skin incision was then carried down to the rectus fascia.  Rectus fascia was then bluntly and sharply dissected off the rectus muscle in superior and inferior fashion.  The rectus muscle was split in the midline.  The parietal peritoneum was ultimately entered bluntly.  This incision was extended superiorly and inferiorly.  An Alexis retractor was then subsequently placed.  The vesicouterine peritoneum was then opened transversely.  The bladder was sharply dissected off the lower uterine segment and displaced inferiorly with a bladder retractor.  It was then noted that there was bevy of the vessels were noted in the lower uterine segment. Nonetheless, the low transverse uterine incision was then made and extended with bandage scissors.  Artificial rupture of membranes, clear copious amniotic fluid.  Floating vertex was encountered.  Subsequent, loop of cord and the rupture of membranes came  down.  The cord was pushed back into the uterine cavity and subsequent delivery of a live female was accomplished.  Cord was around the neck as well, which was reduced.  Cord was clamped and cut.  The baby was bulb suctioned in the abdomen.  The  baby was transferred to the awaiting pediatrician with Apgars 8 and 9 at 1 and 5 minutes.  The placenta which was posterior was manually removed.  Uterine cavity was cleaned of debris.  Uterine cavity had no extension, it was closed in 2 layers, the first layer with 0 Monocryl in a running locked stitch, second layer was imbricated using 0 Monocryl suture.  Bleeding from those prior lower uterine segment vessels were noted prior to the hysterotomy required figure-of-eight 0 Vicryl sutures were placed  in order to facilitate hemostasis.  Once hemostasis was achieved, the abdomen was irrigated and suctioned.  Interceed was placed over the lower uterine segment.  The Jon Gillslexis was removed.  The parietal peritoneum was closed with 2-0 Vicryl. The rectus fascia with 0 Vicryl x2.  The subcutaneous area which was deep was irrigated, suction, and small bleeders cauterized.   Interrupted 2-0 plain sutures were then placed. The skin was approximated with Ethicon staples.  SPECIMEN:  Placenta not sent to Pathology.  ESTIMATED BLOOD LOSS:  Was 800.  URINE  OUTPUT:  400 mL clear yellow urine.  INTRAOPERATIVE FLUID:  3200 mL.  COUNTS:  Sponge and instrument counts x2 was correct.  COMPLICATIONS:  None.  The weight of the baby was 9 pounds 2 ounces.  The baby was transferred to the recovery room in stable condition along with mom who was able to do skin to skin.     Kim Garcia, M.D.     [place]/MEDQ  D:  09/27/2014  T:  09/28/2014  Job:  [id number]

## 2014-09-28 NOTE — Anesthesia Postprocedure Evaluation (Signed)
  Anesthesia Post-op Note  Patient: Kim Garcia  Procedure(s) Performed: Procedure(s) with comments: Primary CESAREAN SECTION (N/A) - EDD: 10/04/14  Patient Location: PACU and Mother/Baby  Anesthesia Type:Spinal  Level of Consciousness: awake  Airway and Oxygen Therapy: Patient Spontanous Breathing  Post-op Pain: mild  Post-op Assessment: Patient's Cardiovascular Status Stable and Respiratory Function Stable  Post-op Vital Signs: stable  Last Vitals:  Filed Vitals:   09/28/14 0600  BP: 103/52  Pulse: 82  Temp: 36.6 C  Resp: 18    Complications: No apparent anesthesia complications

## 2014-09-29 MED ORDER — FERROUS SULFATE 325 (65 FE) MG PO TABS: 325.0000 mg | ORAL_TABLET | Freq: Two times a day (BID) | ORAL | Status: DC

## 2014-09-29 MED ORDER — OXYCODONE-ACETAMINOPHEN 5-325 MG PO TABS: 1.0000 | ORAL_TABLET | ORAL | Status: DC | PRN

## 2014-09-29 MED ORDER — HYDROCHLOROTHIAZIDE 12.5 MG PO CAPS: 12.5000 mg | ORAL_CAPSULE | Freq: Every day | ORAL | Status: DC | PRN

## 2014-09-29 MED ORDER — MAGNESIUM OXIDE 400 (241.3 MG) MG PO TABS: 200.0000 mg | ORAL_TABLET | Freq: Every day | ORAL | Status: DC

## 2014-09-29 MED ORDER — BREAST PUMP MISC: Status: DC

## 2014-09-29 MED ORDER — IBUPROFEN 600 MG PO TABS: 600.0000 mg | ORAL_TABLET | Freq: Four times a day (QID) | ORAL | Status: DC | PRN

## 2014-09-29 NOTE — Progress Notes (Signed)
POSTOPERATIVE DAY # 2 S/P C/S - baby girl   S:         Reports feeling good             Tolerating po intake / no nausea / no vomiting / + flatus / no BM             Bleeding is light             Pain controlled withprescription NSAID's including ibuprofen (Motrin) and Percocet             Up ad lib / ambulatory/ voiding QS  Newborn breast feeding- saw lactation yesterday, using nipple shields and is having better latches             Desires discharge today   O:  VS: BP 102/37  Pulse 79  Temp(Src) 97.9 F (36.6 C) (Oral)  Resp 19  Ht 5\' 2"  (1.575 m)  Wt 116.121 kg (256 lb)  BMI 46.81 kg/m2  SpO2 96%  LMP 12/18/2013  Breastfeeding? Unknown   LABS:               Recent Labs  09/26/14 1525 09/28/14 0610  WBC 13.0* 11.2*  HGB 10.4* 8.5*  PLT 221 171               Bloodtype: --/--/O POS (10/15 1525)  Rubella: Immune (03/18 0000)                                Physical Exam:             Alert and Oriented X3  Lungs: Clear and unlabored  Heart: regular rate and rhythm / no mumurs  Abdomen: soft, non-tender, non-distended              Fundus: firm, non-tender, U-2             Dressing: honey comb dressing with small brown blood; marked by nurse              Incision:  approximated with staples / no erythema / no ecchymosis / small brown drainage  Perineum: intact  Lochia: small, bright red bleeding; no clots  Extremities: +2 dependent edema, no calf pain or tenderness  A:        POD # 2 S/P C/S            ABL Anemia            Obesity            Hx. Of Depression - on Wellbutrin            Doing well - stable  P:        Routine postoperative care              Discharge home today - [facility] discharge book given and reviewed               Follow-up in 1 week with Wiliam Ke. Danylle Ouk for staple removal   Milinda CaveMeredith Causey, SNM  Marlinda MikeBAILEY, Sharie Amorin CNM, MSN, [facility] Center[facility]AC[facility] 09/29/2014, 11:18 AM

## 2014-09-29 NOTE — Discharge Summary (Signed)
POSTOPERATIVE DISCHARGE SUMMARY:  Patient ID: Glennon MacSara P Mara MRN: [id number] DOB/AGE: [birth year] 28 y.o.  Admit date: 09/27/2014 Admission Diagnoses: 39.1 weeks / elective primary cesarean section for LGA / hx fourth degree laceration  Discharge date:  09/29/2014 Discharge Diagnoses: POD 2 s/p primary cesarean section / IDA of pregnancy compounded by ABL  Prenatal history: G3P2011   EDC : 10/03/2014, by Other Basis  [facility]  Primary provider : Jasmine Maceachern Prenatal course complicated by hx fourth degree laceration / depression  Prenatal Labs: ABO, Rh: --/--/O POS (10/15 1525)  Antibody: NEG (10/15 1525) Rubella: Immune (03/18 0000)   RPR: NON REAC (10/15 1525)  HBsAg: Negative, Negative (03/18 0000)  HIV: Non-reactive, Non-reactive, Non-reactive (03/18 0000)  GTT : nl  Medical / Surgical History :  Past medical history:  Past Medical History  Diagnosis Date  . SVD (spontaneous vaginal delivery) 05/2011    x 1  . Missed ab 2014    no surgery required  . Depression   . Seasonal allergies   . Asthma     as child, rarely uses inhaler  . Anemia   . History of blood transfusion 05/2011    [facility]    Past surgical history:  Past Surgical History  Procedure Laterality Date  . Wisdom tooth extraction      Family History: History reviewed. No pertinent family history.  Social History:  reports that she has never smoked. She has never used smokeless tobacco. She reports that she does not drink alcohol or use illicit drugs.  Allergies: Review of patient's allergies indicates no known allergies.   Current Medications at time of admission:  Prior to Admission medications   Medication Sig Start Date End Date Taking? Authorizing Provider  albuterol (PROVENTIL HFA;VENTOLIN HFA) 108 (90 BASE) MCG/ACT inhaler Inhale 1 puff into the lungs every 6 (six) hours as needed for wheezing or shortness of breath.   Yes Historical Provider,  MD  buPROPion (WELLBUTRIN XL) 300 MG 24 hr tablet Take 300 mg by mouth daily.   Yes Historical Provider, MD  cetirizine (ZYRTEC) 10 MG tablet Take 10 mg by mouth daily as needed for allergies.   Yes Historical Provider, MD  Prenatal Vit-Fe Fumarate-FA (PRENATAL MULTIVITAMIN) TABS tablet Take 1 tablet by mouth daily at 12 noon.   Yes Historical Provider, MD    Procedures: Cesarean section delivery on 09/27/2014 with delivery of female newborn by Dr Cherly Hensenousins   See operative report for further details APGAR (1 MIN): 8   APGAR (5 MINS): 9    Postoperative / postpartum course:  Uncomplicated with discharge on POD 2  Discharge Instructions:  Discharged Condition: stable  Activity: pelvic rest and postoperative restrictions x 2   Diet: routine  Medications:    Medication List         albuterol 108 (90 BASE) MCG/ACT inhaler  Commonly known as:  PROVENTIL HFA;VENTOLIN HFA  Inhale 1 puff into the lungs every 6 (six) hours as needed for wheezing or shortness of breath.     Breast Pump Misc  as directed     buPROPion 300 MG 24 hr tablet  Commonly known as:  WELLBUTRIN XL  Take 300 mg by mouth daily.     cetirizine 10 MG tablet  Commonly known as:  ZYRTEC  Take 10 mg by mouth daily as needed for allergies.     ferrous sulfate 325 (65 FE) MG tablet  Take 1 tablet (325 mg  total) by mouth 2 (two) times daily with a meal.     hydrochlorothiazide 12.5 MG capsule  Commonly known as:  MICROZIDE  Take 1 capsule (12.5 mg total) by mouth daily as needed. For worsening edema     ibuprofen 600 MG tablet  Commonly known as:  ADVIL,MOTRIN  Take 1 tablet (600 mg total) by mouth every 6 (six) hours as needed for mild pain.     magnesium oxide 400 (241.3 MG) MG tablet  Commonly known as:  MAG-OX  Take 0.5-1 tablets (200-400 mg total) by mouth daily.     oxyCODONE-acetaminophen 5-325 MG per tablet  Commonly known as:  PERCOCET/ROXICET  Take 1 tablet by mouth every 4 (four) hours as  needed (for pain scale less than 7).     prenatal multivitamin Tabs tablet  Take 1 tablet by mouth daily at 12 noon.        Wound Care: keep clean and dry / [facility]ples Postpartum Instructions: [facility] discharge booklet - instructions reviewed  Discharge to: Home  Follow up :  [facility] in 4-5 days for interval visit with CNM or nurse for staple removal [facility] in 6 weeks for routine postpartum visit with Dr Cherly Hensenousins                Signed: Marlinda MikeBAILEY, TANYA CNM, MSN, [facility]NM 09/29/2014, 11:34 AM

## 2014-09-30 ENCOUNTER — Encounter (HOSPITAL_COMMUNITY): Payer: Self-pay | Admitting: Obstetrics and Gynecology

## 2014-09-30 LAB — TYPE AND SCREEN
ABO/RH(D): O POS
Antibody Screen: NEGATIVE
UNIT DIVISION: 0
Unit division: 0

## 2014-10-14 ENCOUNTER — Encounter (HOSPITAL_COMMUNITY): Payer: Self-pay | Admitting: Obstetrics and Gynecology

## 2015-01-29 IMAGING — CR DG ANKLE COMPLETE 3+V*L*
1 series · 3 of 3 positions shown · non-contrast
Comparison: None.

CLINICAL DATA: Pain the lateral malleolus. Injury today. The
patient is 7 weeks pregnant. Patient was double shielded.

EXAM:
LEFT ANKLE COMPLETE - 3+ VIEW

[Series 1: ap · 0.17mm/px · 3 of 3 slices shown]
[im 1/3]
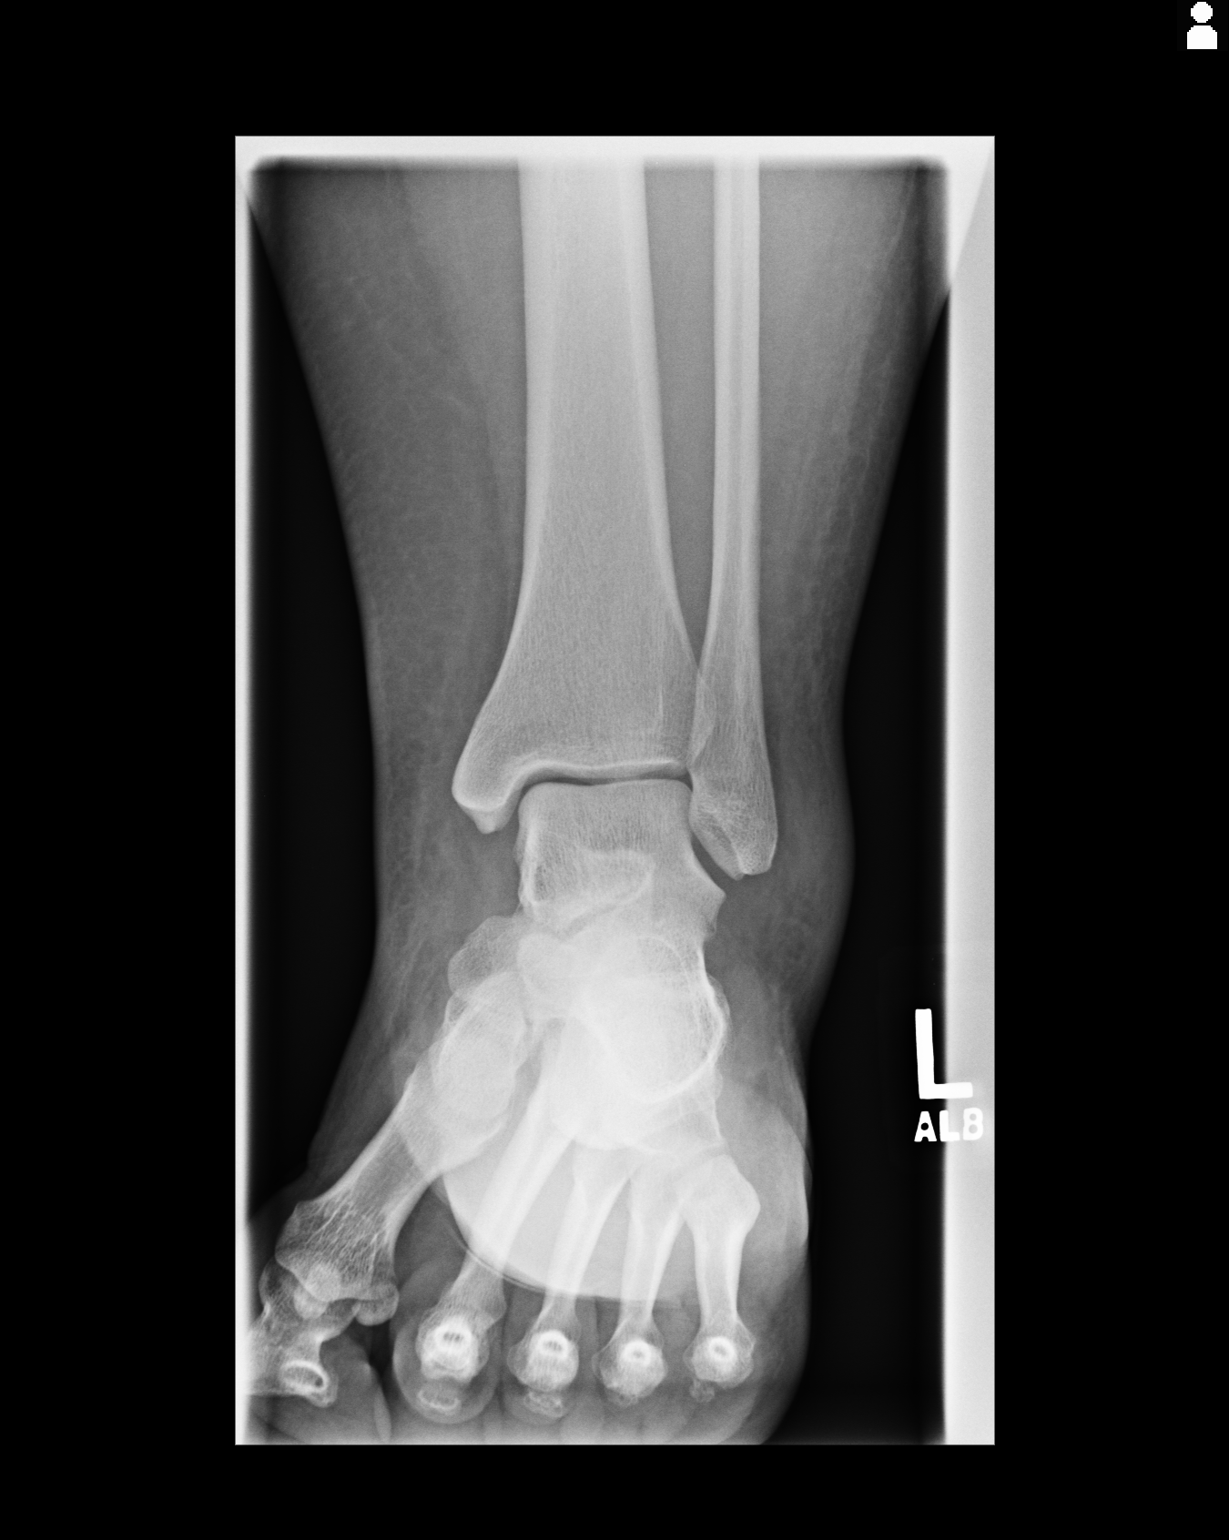
[im 2/3]
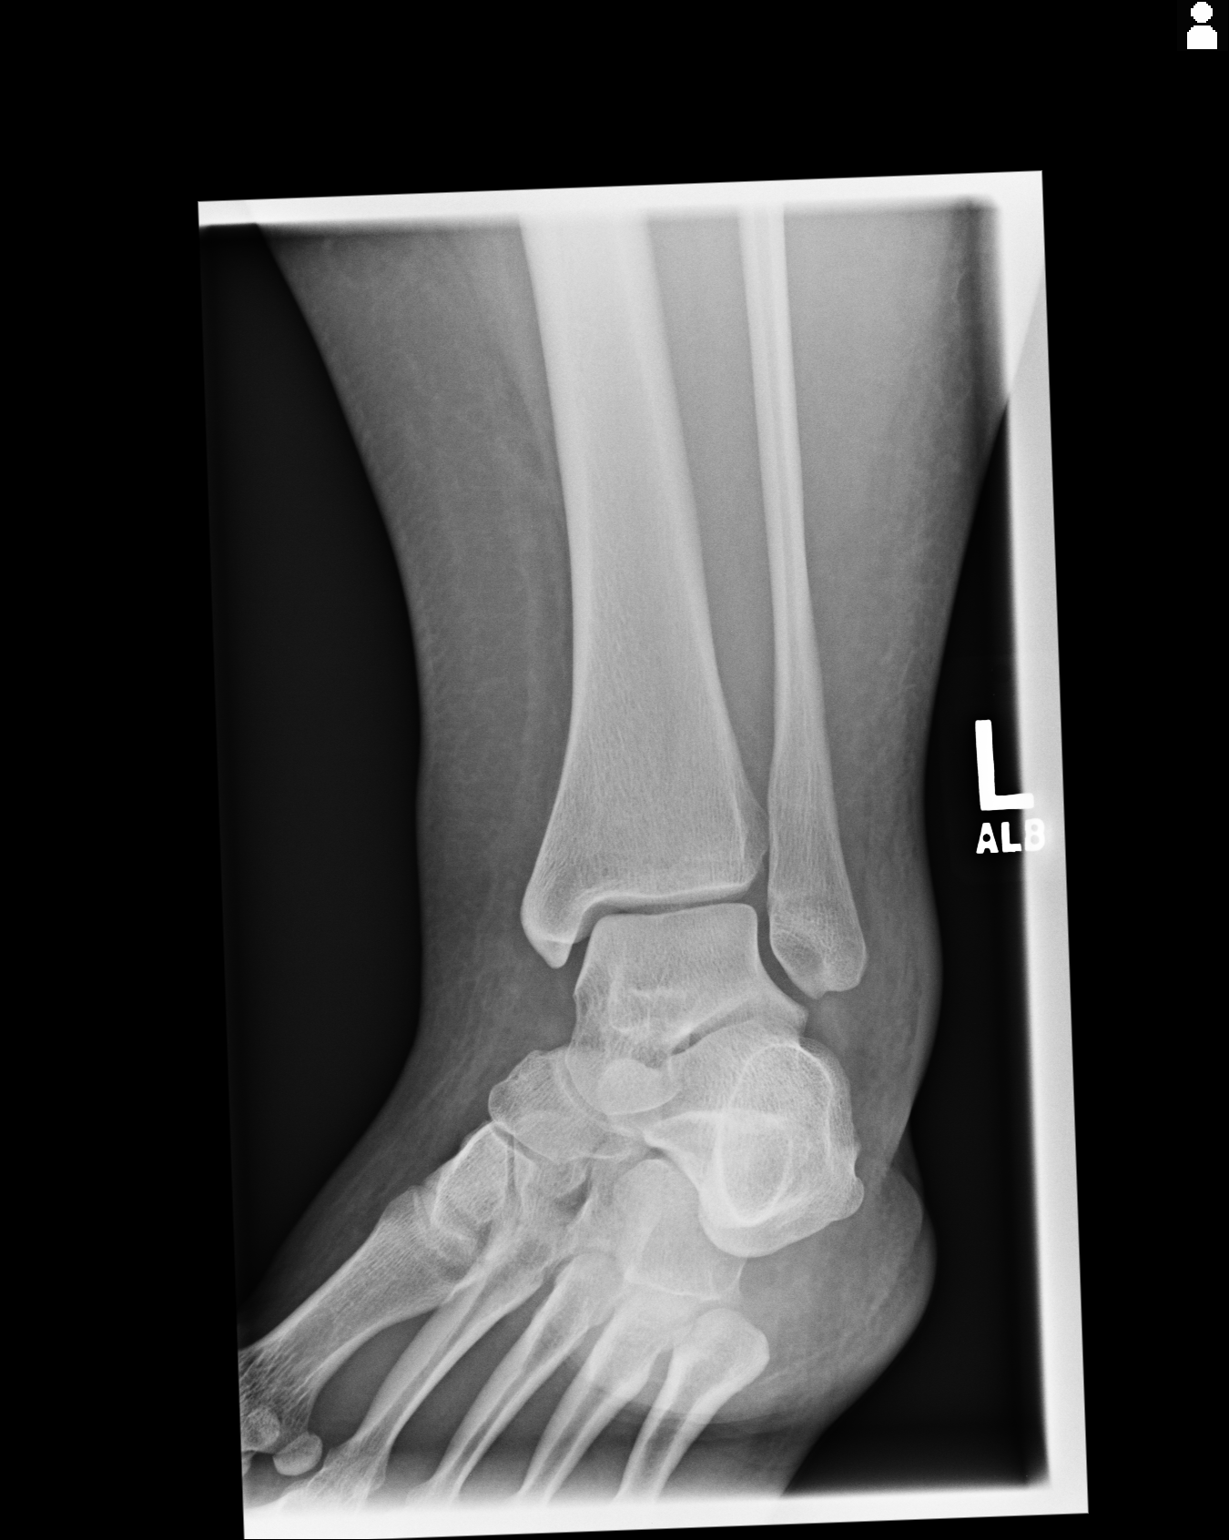
[im 3/3]
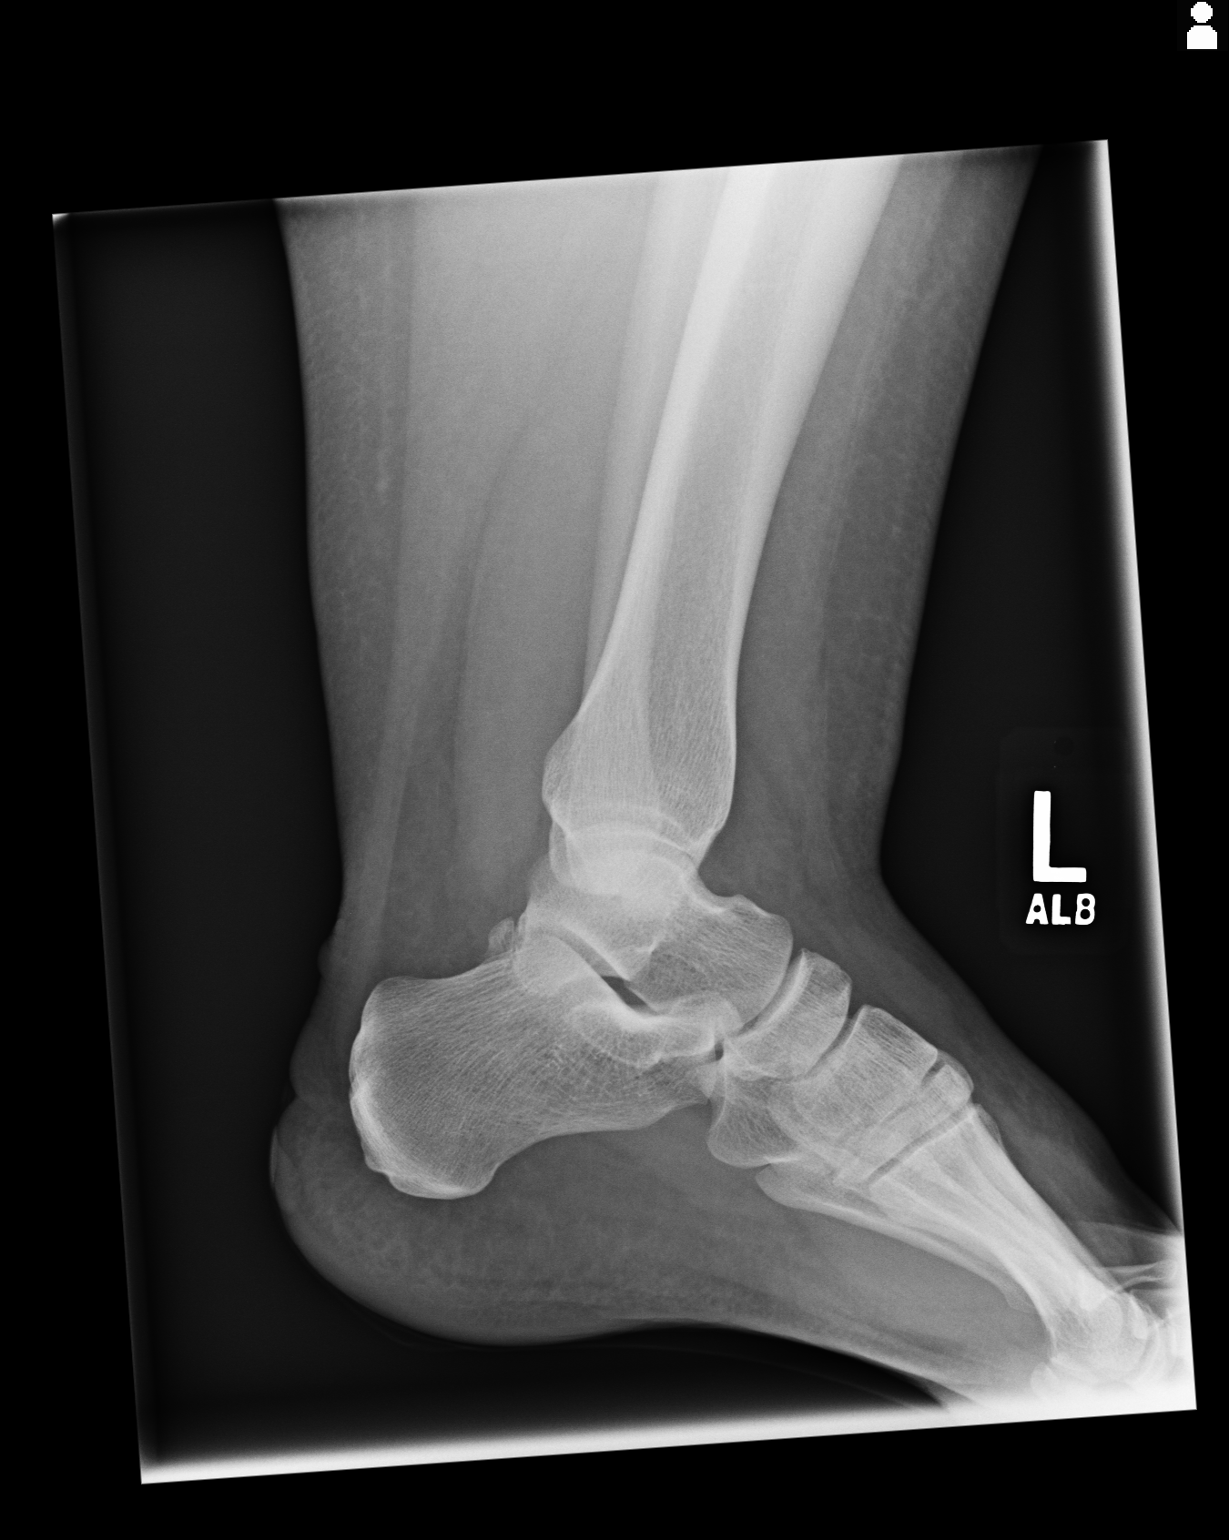

[3 of 3 positions shown; findings below may reference images not displayed]

FINDINGS: There is diffuse soft tissue swelling. No acute fracture or
subluxation. No radiopaque foreign body or soft tissue gas.
IMPRESSION: 1. Significant soft tissue swelling.
2.  No evidence for acute osseous abnormality.

## 2015-07-19 ENCOUNTER — Ambulatory Visit
Admission: EM | Admit: 2015-07-19 | Discharge: 2015-07-19 | Disposition: A | Discharge status: Home / Self Care | Payer: 59 | Attending: Internal Medicine | Admitting: Internal Medicine

## 2015-07-19 ENCOUNTER — Encounter: Payer: Self-pay | Admitting: Gynecology

## 2015-07-19 DIAGNOSIS — Z79899 Other long term (current) drug therapy: Secondary | ICD-10-CM | POA: Diagnosis not present

## 2015-07-19 DIAGNOSIS — F329 Major depressive disorder, single episode, unspecified: Secondary | ICD-10-CM | POA: Diagnosis not present

## 2015-07-19 DIAGNOSIS — R300 Dysuria: Secondary | ICD-10-CM | POA: Diagnosis not present

## 2015-07-19 LAB — URINALYSIS COMPLETE WITH MICROSCOPIC (ARMC ONLY)
Bilirubin Urine: NEGATIVE
Glucose, UA: NEGATIVE mg/dL
KETONES UR: NEGATIVE mg/dL
NITRITE: NEGATIVE
PROTEIN: NEGATIVE mg/dL
Specific Gravity, Urine: 1.015 (ref 1.005–1.030)
pH: 6.5 (ref 5.0–8.0)

## 2015-07-19 MED ORDER — FLUCONAZOLE 150 MG PO TABS: 150.0000 mg | ORAL_TABLET | Freq: Every day | ORAL | Status: DC

## 2015-07-19 MED ORDER — NITROFURANTOIN MONOHYD MACRO 100 MG PO CAPS: 100.0000 mg | ORAL_CAPSULE | Freq: Two times a day (BID) | ORAL | Status: DC

## 2015-07-19 NOTE — ED Notes (Signed)
Patient c/o frequent urination / painful urination x 3 days ago.

## 2015-07-21 LAB — URINE CULTURE

## 2015-07-22 ENCOUNTER — Encounter: Payer: Self-pay | Admitting: Physician Assistant

## 2015-07-22 NOTE — ED Provider Notes (Signed)
CSN: [id number]     Arrival date & time 07/19/15  0829 History   First MD Initiated Contact with Patient 07/19/15 1008     Chief Complaint  Patient presents with  . Urinary Tract Infection   (Consider location/radiation/quality/duration/timing/severity/associated sxs/prior Treatment) HPI  29 yo F reporting 2-3 days of dysuria, no back pain, fever, nausea or vomiting. Has had an occasional UTI in the past and recognizes symptoms   LMP 2 weeks ago. Denies itching or irritation. Denies any possibility of STD exposure  Past Medical History  Diagnosis Date  . SVD (spontaneous vaginal delivery) 05/2011    x 1  . Missed ab 2014    no surgery required  . Depression   . Seasonal allergies   . Asthma     as child, rarely uses inhaler  . Anemia   . History of blood transfusion 05/2011    1 unit transfused at [facility]   Past Surgical History  Procedure Laterality Date  . Wisdom tooth extraction    . Cesarean section N/A 09/27/2014    Procedure: Primary CESAREAN SECTION;  Surgeon: Serita Kyle, MD;  Location: [facility];  Service: Obstetrics;  Laterality: N/A;  EDD: 10/04/14   History reviewed. No pertinent family history. History  Substance Use Topics  . Smoking status: Never Smoker   . Smokeless tobacco: Never Used  . Alcohol Use: No   OB History    Gravida Para Term Preterm AB TAB SAB Ectopic Multiple Living   Review of Systems Review of 10 systems negative for acute change except as referenced in HPI  Allergies  Review of patient's allergies indicates no known allergies.  Home Medications   Prior to Admission medications   Medication Sig Start Date End Date Taking? Authorizing Provider  albuterol (PROVENTIL HFA;VENTOLIN HFA) 108 (90 BASE) MCG/ACT inhaler Inhale 1 puff into the lungs every 6 (six) hours as needed for wheezing or shortness of breath.   Yes Historical Provider, MD  buPROPion (WELLBUTRIN XL) 300 MG 24 hr tablet Take 300 mg by mouth daily.   Yes  Historical Provider, MD  cetirizine (ZYRTEC) 10 MG tablet Take 10 mg by mouth daily as needed for allergies.   Yes Historical Provider, MD  ibuprofen (ADVIL,MOTRIN) 600 MG tablet Take 1 tablet (600 mg total) by mouth every 6 (six) hours as needed for mild pain. 09/29/14  Yes Marlinda Mike, CNM  ferrous sulfate 325 (65 FE) MG tablet Take 1 tablet (325 mg total) by mouth 2 (two) times daily with a meal. 09/29/14   Marlinda Mike, CNM  fluconazole (DIFLUCAN) 150 MG tablet Take 1 tablet (150 mg total) by mouth daily. Take one tablet if symptoms develop- repeat in one week if needed 07/19/15   Rae Halsted, PA-C  hydrochlorothiazide (MICROZIDE) 12.5 MG capsule Take 1 capsule (12.5 mg total) by mouth daily as needed. For worsening edema 09/29/14   Marlinda Mike, CNM  magnesium oxide (MAG-OX) 400 (241.3 MG) MG tablet Take 0.5-1 tablets (200-400 mg total) by mouth daily. 09/29/14   Marlinda Mike, CNM  Misc. Devices (BREAST PUMP) MISC as directed 09/29/14   Marlinda Mike, CNM  nitrofurantoin, macrocrystal-monohydrate, (MACROBID) 100 MG capsule Take 1 capsule (100 mg total) by mouth 2 (two) times daily. 07/19/15   Rae Halsted, PA-C  oxyCODONE-acetaminophen (PERCOCET/ROXICET) 5-325 MG per tablet Take 1 tablet by mouth every 4 (four) hours as needed (for pain scale less  than 7). 09/29/14   Marlinda Mike, CNM  Prenatal Vit-Fe Fumarate-FA (PRENATAL MULTIVITAMIN) TABS tablet Take 1 tablet by mouth daily at 12 noon.    Historical Provider, MD   BP 131/91 mmHg  Pulse 89  Temp(Src) 98.1 F (36.7 C) (Oral)  Resp 18  Ht 5\' 1"  (1.549 m)  Wt 256 lb (116.121 kg)  BMI 48.40 kg/m2  SpO2 98%  LMP 07/05/2015 (LMP Unknown)  Breastfeeding? No Physical Exam Constitutional -alert and oriented,well appearing and in no acute distress Head-atraumatic Eyes-,conjugate gaze Nose- no congestion or rhinorrhea Mouth/throat- mucous membranes moist , Neck- supple CV- regular rate, grossly normal heart sounds, Resp-no distress, normal  respiratory effort,clear to auscultation bilaterally Back - no CVAT GI-no distention GU-  not examined MSK- no lower extremity tenderness nor edema,no joint effusion, ambulatory Neuro- normal speech and language,  Skin-warm,dry ,intact; no rash noted Psych-mood and affect grossly normal; speech and behavior grossly normal  ED Course  Procedures (including critical care time) Labs Review Labs Reviewed  URINALYSIS COMPLETEWITH MICROSCOPIC ([facility] ONLY) - Abnormal; Notable for the following:    Color, Urine STRAW (*)    APPearance HAZY (*)    Hgb urine dipstick 1+ (*)    Leukocytes, UA 3+ (*)    Bacteria, UA FEW (*)    Squamous Epithelial / LPF 0-5 (*)    All other components within normal limits  URINE CULTURE    Imaging Review No results found.   MDM   1. Dysuria     Budding yeast and many WBCs noted in specimen. She defers STD testing.Will submit for culture. Treat with Macrobid x 5 days pending ( refill available). Given Diflucan 150 mg  #2 to be used if symptomatic after atb course. Encourage PO fluids. Call in 3 days for culture results. Diagnosis and treatment discussed. . Questions fielded, expectations and recommendations reviewed. Patient expresses understanding. Will return to [facility] with questions, concern or exacerbation.   Rae Halsted, PA-C 07/22/15 1506

## 2015-11-19 ENCOUNTER — Ambulatory Visit: Payer: Self-pay | Admitting: Physician Assistant

## 2015-11-19 ENCOUNTER — Encounter: Payer: Self-pay | Admitting: Physician Assistant

## 2015-11-19 VITALS — BP 120/80 | HR 76 | Temp 98.4°F

## 2015-11-19 DIAGNOSIS — N390 Urinary tract infection, site not specified: Secondary | ICD-10-CM

## 2015-11-19 DIAGNOSIS — R319 Hematuria, unspecified: Secondary | ICD-10-CM

## 2015-11-19 DIAGNOSIS — R300 Dysuria: Secondary | ICD-10-CM

## 2015-11-19 LAB — POCT URINALYSIS DIPSTICK
BILIRUBIN UA: NEGATIVE
GLUCOSE UA: NEGATIVE
KETONES UA: NEGATIVE
Nitrite, UA: NEGATIVE
PH UA: 6.5
Spec Grav, UA: 1.025
Urobilinogen, UA: 0.2

## 2015-11-19 MED ORDER — CIPROFLOXACIN HCL 250 MG PO TABS: 250.0000 mg | ORAL_TABLET | Freq: Two times a day (BID) | ORAL | Status: DC

## 2015-11-19 NOTE — Progress Notes (Signed)
S:  C/o uti sx for 2 days, burning, urgency, frequency, denies vaginal discharge, abdominal pain or flank pain: Also head congestion with green mucus, no fever/chills, no cp/sob, sx for 3 weeks;  Remainder ros neg; is no longer breast feeding  O:  Vitals wnl, nad, tms clear, nasal mucosa red and swollen on left side, throat injected, neck supple no lymph,  lungs c t a,cv rrr, abd soft nontender, bs normal, n/v intact; ua 1+ leuks, trace blood  A: uti, uri  P: cipro 250mg  bid x 7d, increase water intake, add cranberry juice, return if not improving in 2 -3 days, return earlier if worsening, discussed pyelonephritis sx

## 2015-12-03 ENCOUNTER — Ambulatory Visit: Payer: Self-pay | Admitting: Physician Assistant

## 2015-12-03 ENCOUNTER — Encounter: Payer: Self-pay | Admitting: Physician Assistant

## 2015-12-03 VITALS — BP 130/80 | HR 133 | Temp 98.5°F

## 2015-12-03 DIAGNOSIS — H9202 Otalgia, left ear: Secondary | ICD-10-CM

## 2015-12-03 DIAGNOSIS — N390 Urinary tract infection, site not specified: Secondary | ICD-10-CM

## 2015-12-03 LAB — POCT URINALYSIS DIPSTICK
GLUCOSE UA: NEGATIVE
LEUKOCYTES UA: NEGATIVE
NITRITE UA: NEGATIVE
Spec Grav, UA: 1.025
Urobilinogen, UA: 0.2
pH, UA: 6

## 2015-12-03 NOTE — Progress Notes (Signed)
S: here for recheck of uti, states she feels better, is currently on her period, also states her left ear is feeling full and a little painful, no fever/chills, no cough  O: vitals wnl, nad, tms dull b/l, throat wnl, neck supple no lymph, lungs c t a, cv rrr, ua 3+blood  A: resolved uti, ear pain  P: otc sudafed, afrin, flonase, if worsening can add prednisone, recheck if not better by 12/27

## 2016-01-13 ENCOUNTER — Encounter: Payer: Self-pay | Admitting: Physician Assistant

## 2016-01-13 ENCOUNTER — Ambulatory Visit: Payer: Self-pay | Admitting: Physician Assistant

## 2016-01-13 VITALS — BP 128/92 | HR 80 | Temp 98.3°F

## 2016-01-13 DIAGNOSIS — R300 Dysuria: Secondary | ICD-10-CM

## 2016-01-13 DIAGNOSIS — H6983 Other specified disorders of Eustachian tube, bilateral: Secondary | ICD-10-CM

## 2016-01-13 DIAGNOSIS — J018 Other acute sinusitis: Secondary | ICD-10-CM

## 2016-01-13 LAB — POCT URINALYSIS DIPSTICK
BILIRUBIN UA: NEGATIVE
Blood, UA: NEGATIVE
GLUCOSE UA: NEGATIVE
Ketones, UA: NEGATIVE
Leukocytes, UA: NEGATIVE
NITRITE UA: NEGATIVE
Spec Grav, UA: >=1.03
Urobilinogen, UA: 0.2
pH, UA: 6

## 2016-01-13 MED ORDER — FLUCONAZOLE 150 MG PO TABS: 150.0000 mg | ORAL_TABLET | Freq: Once | ORAL | Status: DC

## 2016-01-13 MED ORDER — METHYLPREDNISOLONE 4 MG PO TBPK: ORAL_TABLET | ORAL | Status: DC

## 2016-01-13 MED ORDER — AMOXICILLIN-POT CLAVULANATE 875-125 MG PO TABS: 1.0000 | ORAL_TABLET | Freq: Two times a day (BID) | ORAL | Status: DC

## 2016-01-13 NOTE — Progress Notes (Signed)
S: 1. C/o urinary pressure and urgency, sensation of uti for 1 week, no fever/chills/v/d; some pelvic pain; 2: ears still hurt, sx since beginning of December, mucus is green, some head congestion, no fever/chills/decreased hearing, has small children in the house, 30 y/o has congestion/allergies a lot  O: vitals wnl, nad, tms dull with increased pink on left , nasal mucosa red and swollen, throat wnl, neck supple no lymph, lungs c t a, cv rrr, ua wnl  A: dysuria, acute sinusitis with eustachean tube dysfunction  P: augmentin  bid x 10d, medrol dose pack, diflucan, if not better in 5 days will refer to ENT or pt can call to make appt herself

## 2016-02-12 DIAGNOSIS — H93299 Other abnormal auditory perceptions, unspecified ear: Secondary | ICD-10-CM | POA: Diagnosis not present

## 2016-02-12 DIAGNOSIS — H9209 Otalgia, unspecified ear: Secondary | ICD-10-CM | POA: Diagnosis not present

## 2016-03-20 DIAGNOSIS — H01001 Unspecified blepharitis right upper eyelid: Secondary | ICD-10-CM | POA: Diagnosis not present

## 2016-04-01 ENCOUNTER — Ambulatory Visit (INDEPENDENT_AMBULATORY_CARE_PROVIDER_SITE_OTHER): Payer: 59 | Admitting: Family Medicine

## 2016-04-01 ENCOUNTER — Encounter: Payer: Self-pay | Admitting: Family Medicine

## 2016-04-01 VITALS — BP 124/86 | HR 101 | Temp 98.4°F | Ht 62.0 in | Wt 270.6 lb

## 2016-04-01 DIAGNOSIS — Z13220 Encounter for screening for lipoid disorders: Secondary | ICD-10-CM

## 2016-04-01 DIAGNOSIS — E669 Obesity, unspecified: Secondary | ICD-10-CM

## 2016-04-01 DIAGNOSIS — F32A Depression, unspecified: Secondary | ICD-10-CM

## 2016-04-01 DIAGNOSIS — M722 Plantar fascial fibromatosis: Secondary | ICD-10-CM | POA: Insufficient documentation

## 2016-04-01 DIAGNOSIS — F419 Anxiety disorder, unspecified: Secondary | ICD-10-CM | POA: Insufficient documentation

## 2016-04-01 DIAGNOSIS — R600 Localized edema: Secondary | ICD-10-CM

## 2016-04-01 DIAGNOSIS — F329 Major depressive disorder, single episode, unspecified: Secondary | ICD-10-CM

## 2016-04-01 DIAGNOSIS — R509 Fever, unspecified: Secondary | ICD-10-CM | POA: Diagnosis not present

## 2016-04-01 DIAGNOSIS — F418 Other specified anxiety disorders: Secondary | ICD-10-CM | POA: Diagnosis not present

## 2016-04-01 MED ORDER — BUPROPION HCL ER (XL) 150 MG PO TB24: 150.0000 mg | ORAL_TABLET | Freq: Every day | ORAL | Status: DC

## 2016-04-01 NOTE — Patient Instructions (Signed)
Nice to meet you. I have refilled your Wellbutrin. Please try to stretches for your feet. You can also freeze a puddle water and a roll is across the bottom of your foot 2-3 times a day for 15-20 minutes. Please also pick up heel cups for your shoes. Please monitor for recurrence of her fever and chills. If you develop any other symptoms with this please let us know. If you develop fever, chills, cough, congestion, diarrhea, vomiting, thoughts of harming herself or others, or any new or changing symptoms please seek medical attention.

## 2016-04-01 NOTE — Assessment & Plan Note (Signed)
Reports anxiety and depression. Previously on Wellbutrin and responded well to this in the past. No SI or HI. We will restart on Wellbutrin. She'll continue to monitor. Follow-up in one month. Given return precautions.

## 2016-04-01 NOTE — Progress Notes (Signed)
Patient ID: Kim Garcia, female   DOB: 1986/11/19, 30 y.o.   MRN: [id number]  Tommi Rumps, MD Phone: [phone]  Kim Garcia is a 30 y.o. female who presents today for new patient visit.  Depression and anxiety: Patient notes lack of motivation and mild depression and anxiety for the last 4-5 years. Was previously on Wellbutrin those been off of this for the last 4 months. Notes stressful job and she has cut back on her work hours which has helped. Little motivation to do things once home. Some anxiety and worries a lot. No SI or HI.  Plantar fasciitis: Patient notes bilateral heel pain for the last 2-3 months. Notes it is worse the first time she hits the floor in the morning. Notes some mild swelling in her ankles as well. This improves with propping her legs up. No orthopnea or PND. No injury. No numbness or weakness. She has used inserts for her shoes though this has not been beneficial.  Patient additionally note she had a fever of 102F yesterday. Had some chills and shivering. No other symptoms with this. Feels better today. Notes her children had GI illnesses and upper respiratory illnesses.  Active Ambulatory Problems    Diagnosis Date Noted  . Postpartum care following cesarean delivery (10/16) 09/27/2014  . Anxiety and depression 04/01/2016  . Plantar fasciitis 04/01/2016  . Fever, unspecified 04/01/2016   Resolved Ambulatory Problems    Diagnosis Date Noted  . No Resolved Ambulatory Problems   Past Medical History  Diagnosis Date  . SVD (spontaneous vaginal delivery) 05/2011  . Missed ab 2014  . Depression   . Seasonal allergies   . Asthma   . Anemia   . History of blood transfusion 05/2011    Family History  Problem Relation Age of Onset  . Alcoholism      Parent  . Colon cancer Maternal Grandmother   . Stroke Paternal Grandmother   . Diabetes Maternal Grandmother   . Diabetes Paternal Grandmother     Social History   Social History  . Marital Status:  Married    Spouse Name: N/A  . Number of Children: N/A  . Years of Education: N/A   Occupational History  . Not on file.   Social History Main Topics  . Smoking status: Never Smoker   . Smokeless tobacco: Never Used  . Alcohol Use: No  . Drug Use: No  . Sexual Activity: Yes    Birth Control/ Protection: None     Comment: pregnant   Other Topics Concern  . Not on file   Social History Narrative    ROS  General:  Negative for nexplained weight loss, fever Skin: Negative for new or changing mole, sore that won't heal HEENT: Negative for trouble hearing, trouble seeing, ringing in ears, mouth sores, hoarseness, change in voice, dysphagia. CV:  Negative for chest pain, dyspnea, edema, palpitations, Orthopnea, PND Resp: Negative for cough, dyspnea, hemoptysis GI: Negative for nausea, vomiting, diarrhea, constipation, abdominal pain, melena, hematochezia. GU: Negative for dysuria, incontinence, urinary hesitance, hematuria, vaginal or penile discharge, polyuria, sexual difficulty, lumps in testicle or breasts MSK: Negative for muscle cramps or aches, joint pain or swelling Neuro: Negative for headaches, weakness, numbness, dizziness, passing out/fainting Psych: Negative for depression, anxiety, memory problems  Objective  Physical Exam Filed Vitals:   04/01/16 1524  BP: 124/86  Pulse: 101  Temp: 98.4 F (36.9 C)    BP Readings from Last 3 Encounters:  04/01/16 124/86  01/13/16 128/92  12/03/15 130/80   Wt Readings from Last 3 Encounters:  04/01/16 270 lb 9.6 oz (122.743 kg)  07/19/15 256 lb (116.121 kg)  09/28/14 256 lb (116.121 kg)    Physical Exam  Constitutional: She is well-developed, well-nourished, and in no distress.  HENT:  Head: Normocephalic and atraumatic.  Right Ear: External ear normal.  Left Ear: External ear normal.  Mouth/Throat: Oropharynx is clear and moist. No oropharyngeal exudate.  Eyes: Conjunctivae are normal. Pupils are equal, round,  and reactive to light.  Neck: Neck supple.  Cardiovascular: Normal rate, regular rhythm and normal heart sounds.   Pulmonary/Chest: Effort normal and breath sounds normal.  Abdominal: Soft. Bowel sounds are normal. She exhibits no distension. There is no tenderness. There is no rebound and no guarding.  Musculoskeletal:  Bilateral ankles with no tenderness or appreciable swelling, bilateral heels and feet with no tenderness palpation or swelling, feet are warm and well perfused with good sensation to light touch  Lymphadenopathy:    She has no cervical adenopathy.  Neurological: She is alert. Gait normal.  Skin: Skin is warm and dry. She is not diaphoretic.  Psychiatric: Affect normal.  Mood depressed     Assessment/Plan:   Anxiety and depression Reports anxiety and depression. Previously on Wellbutrin and responded well to this in the past. No SI or HI. We will restart on Wellbutrin. She'll continue to monitor. Follow-up in one month. Given return precautions.  Plantar fasciitis Symptoms most consistent with plantar fasciitis. No reproducibility on exam at this time. Discussed supportive care including icing, heel cups, and stretches. She will do these for the next month and continue to monitor. She also notes mild swelling in her ankles that is not overly evident today, though given her complaint we will check basic lab work as outlined below to rule out other causes.  Fever, unspecified Suspect fever patient had yesterday was likely related to viral illness given contacts with GI and upper respiratory illnesses. Asymptomatic today. Minimal tachycardia to 101. Suspect this could be related to infection. She'll continue to monitor for recurrence of symptoms. Given return precautions.    Orders Placed This Encounter  Procedures  . Comp Met (CMET)    Standing Status: Future     Number of Occurrences:      Standing Expiration Date: 04/01/2017  . CBC    Standing Status: Future      Number of Occurrences:      Standing Expiration Date: 04/01/2017  . TSH    Standing Status: Future     Number of Occurrences:      Standing Expiration Date: 04/01/2017  . Lipid Profile    Standing Status: Future     Number of Occurrences:      Standing Expiration Date: 04/01/2017  . HgB A1c    Standing Status: Future     Number of Occurrences:      Standing Expiration Date: 04/01/2017    Meds ordered this encounter  Medications  . buPROPion (WELLBUTRIN XL) 150 MG 24 hr tablet    Sig: Take 1 tablet (150 mg total) by mouth daily.    Dispense:  30 tablet    Refill:  [place], MD [place]

## 2016-04-01 NOTE — Assessment & Plan Note (Addendum)
Symptoms most consistent with plantar fasciitis. No reproducibility on exam at this time. Discussed supportive care including icing, heel cups, and stretches. She will do these for the next month and continue to monitor. She also notes mild swelling in her ankles that is not overly evident today, though given her complaint we will check basic lab work as outlined below to rule out other causes.

## 2016-04-01 NOTE — Progress Notes (Signed)
Pre visit review using our clinic review tool, if applicable. No additional management support is needed unless otherwise documented below in the visit note. 

## 2016-04-01 NOTE — Assessment & Plan Note (Signed)
Suspect fever patient had yesterday was likely related to viral illness given contacts with GI and upper respiratory illnesses. Asymptomatic today. Minimal tachycardia to 101. Suspect this could be related to infection. She'll continue to monitor for recurrence of symptoms. Given return precautions.

## 2016-04-17 ENCOUNTER — Encounter: Payer: Self-pay | Admitting: *Deleted

## 2016-04-17 ENCOUNTER — Ambulatory Visit
Admission: EM | Admit: 2016-04-17 | Discharge: 2016-04-17 | Disposition: A | Discharge status: Home / Self Care | Payer: 59 | Attending: Emergency Medicine | Admitting: Emergency Medicine

## 2016-04-17 DIAGNOSIS — H01001 Unspecified blepharitis right upper eyelid: Secondary | ICD-10-CM | POA: Diagnosis not present

## 2016-04-17 MED ORDER — ERYTHROMYCIN 5 MG/GM OP OINT: TOPICAL_OINTMENT | OPHTHALMIC | Status: DC

## 2016-04-17 NOTE — Discharge Instructions (Signed)
Blepharitis Blepharitis is inflammation of the eyelids. Blepharitis may happen with:  Reddish, scaly skin around the scalp and eyebrows.  Burning or itching of the eyelids.  Eye discharge at night that causes the eyelashes to stick together in the morning.  Eyelashes that fall out.  Sensitivity to light. HOME CARE INSTRUCTIONS Pay attention to any changes in how you look or feel. Follow these instructions to help with your condition: Keeping Clean  Wash your hands often.  Wash your eyelids with warm water or with warm water that is mixed with a small amount of baby shampoo. Do this two times per day or as often as needed.  Wash your face and eyebrows at least once a day.  Use a clean towel each time you dry your eyelids. Do not use this towel to clean or dry other areas of your body. Do not share your towel with anyone. General Instructions  Avoid wearing makeup until you get better. Do not share makeup with anyone.  Avoid rubbing your eyes.  Apply warm compresses to your eyes 2 times per day for 10 minutes at a time, or as told by your health care provider.  If you were prescribed an antibiotic ointment or steroid drops, apply or use the medicine as told by your health care provider. Do not stop using the medicine even if you feel better.  Keep all follow-up visits as told by your health care provider. This is important. SEEK MEDICAL CARE IF:  Your eyelids feel hot.  You have blisters or a rash on your eyelids.  The condition does not go away in 2-4 days.  The inflammation gets worse. SEEK IMMEDIATE MEDICAL CARE IF:  You have pain or redness that gets worse or spreads to other parts of your face.  Your vision changes.  You have pain when looking at lights or moving objects.  You have a fever.   This information is not intended to replace advice given to you by your health care provider. Make sure you discuss any questions you have with your health care  provider.   Document Released: 11/26/2000 Document Revised: 08/20/2015 Document Reviewed: 03/24/2015 Elsevier Interactive Patient Education 2016 [employer] Inc[employer]  

## 2016-04-17 NOTE — ED Provider Notes (Signed)
CSN: [id number]     Arrival date & time 04/17/16  2130 History   First MD Initiated Contact with Patient 04/17/16 1045     Chief Complaint  Patient presents with  . Belepharitis   (Consider location/radiation/quality/duration/timing/severity/associated sxs/prior Treatment) HPI  This is a 30 year old female who presents with right upper eyelid swelling and a gritty sensation that started yesterday morning. In a very similar incident on her left upper eyelid about one month ago and treated with bacitracin ophthalmic. She states she did not see a big change when using the medication but eventually the eyelid became normal again. She does not wear contact lenses. He states that she is not have any photophobia. Does not have dandruff or rosacea. Is not having any visual disturbances. She attempted to use the bacitracin again for this incident not noticed any improvement at all. Not had any matting and has no conjunctival redness.     Past Medical History  Diagnosis Date  . SVD (spontaneous vaginal delivery) 05/2011    x 1  . Missed ab 2014    no surgery required  . Depression   . Seasonal allergies   . Asthma     as child, rarely uses inhaler  . Anemia   . History of blood transfusion 05/2011    1 unit [facility] at [facility]   Past Surgical History  Procedure Laterality Date  . Wisdom tooth extraction    . Cesarean section N/A 09/27/2014    Procedure: Primary CESAREAN SECTION;  Surgeon: Serita Kyle, MD;  Location: [facility];  Service: Obstetrics;  Laterality: N/A;  EDD: 10/04/14   Family History  Problem Relation Age of Onset  . Alcoholism      Parent  . Colon cancer Maternal Grandmother   . Stroke Paternal Grandmother   . Diabetes Maternal Grandmother   . Diabetes Paternal Grandmother    Social History  Substance Use Topics  . Smoking status: Never Smoker   . Smokeless tobacco: Never Used  . Alcohol Use: No   OB History    Gravida Para Term Preterm AB TAB SAB Ectopic  Multiple Living   Review of Systems  Constitutional: Negative for fever, chills, activity change and fatigue.  Eyes: Positive for discharge and itching. Negative for photophobia, redness and visual disturbance.  All other systems reviewed and are negative.   Allergies  Review of patient's allergies indicates no known allergies.  Home Medications   Prior to Admission medications   Medication Sig Start Date End Date Taking? Authorizing Provider  buPROPion (WELLBUTRIN XL) 150 MG 24 hr tablet Take 1 tablet (150 mg total) by mouth daily. 04/01/16  Yes Glori Luis, MD  cetirizine (ZYRTEC) 10 MG tablet Take 10 mg by mouth daily as needed for allergies.   Yes Historical Provider, MD  ibuprofen (ADVIL,MOTRIN) 600 MG tablet Take 1 tablet (600 mg total) by mouth every 6 (six) hours as needed for mild pain. 09/29/14  Yes Marlinda Mike, CNM  erythromycin ophthalmic ointment Place a 1/2 inch ribbon of ointment into the lower eyelid 6 times daily for 7 days. Use after applying warm compresses. 04/17/16   Lutricia Feil, PA-C   Meds Ordered and Administered this Visit  Medications - No data to display  BP 139/97 mmHg  Pulse 92  Temp(Src) 98.1 F (36.7 C) (Oral)  Resp 18  Ht  (1.575 m)  Wt 260 lb (117.935  kg)  BMI 47.54 kg/m2  SpO2 98%  LMP 04/17/2016  Breastfeeding? No No data found.   Physical Exam  Constitutional: She is oriented to person, place, and time. She appears well-developed and well-nourished. No distress.  HENT:  Head: Normocephalic and atraumatic.  Right Ear: External ear normal.  Left Ear: External ear normal.  Nose: Nose normal.  Mouth/Throat: Oropharynx is clear and moist. No oropharyngeal exudate.  No scaling or flaking of the scalp is noticed.  Eyes: Conjunctivae and EOM are normal. Pupils are equal, round, and reactive to light. Right eye exhibits discharge. Left eye exhibits no discharge.  Examination of the right eye shows obvious  swelling of the upper lid. There is minimal swelling of the lower lid. The lower lashes there appears to be some dandruff appearing flakes. No foreign body was seen with eversion of the upper lid. Conjunctiva appears normal. She does have some clear fluid type discharge. Visual acuity is normal as recorded.  Neck: Normal range of motion. Neck supple.  Musculoskeletal: Normal range of motion.  Lymphadenopathy:    She has no cervical adenopathy.  Neurological: She is alert and oriented to person, place, and time.  Skin: Skin is warm and dry. She is not diaphoretic.  Psychiatric: She has a normal mood and affect. Her behavior is normal. Judgment and thought content normal.  Nursing note and vitals reviewed.   ED Course  Procedures (including critical care time)  Labs Review Labs Reviewed - No data to display  Imaging Review No results found.   Visual Acuity Review  Right Eye Distance: 20/30 Left Eye Distance: 20/25 Bilateral Distance: 20/20  Right Eye Near:   Left Eye Near:    Bilateral Near:         MDM   1. Blepharitis of right upper eyelid    Discharge Medication List as of 04/17/2016 11:13 AM    START taking these medications   Details  erythromycin ophthalmic ointment Place a 1/2 inch ribbon of ointment into the lower eyelid 6 times daily for 7 days. Use after applying warm compresses., Normal      Plan: 1. Test/x-ray results and diagnosis reviewed with patient 2. rx as per orders; risks, benefits, potential side effects reviewed with patient 3. Recommend supportive treatment with Warm compresses 3-4 times a day for 5-10 minutes at a time. During application of the warm compresses I recommended placing erythromycin ribbon in her lower lid. She will also gently massage the lid. Since this is her second occurrence I recommended she be seen by ophthalmology next week. I have given her the phone number and address of her [facility]. 4. F/u prn if symptoms worsen or  don't improve     Lutricia FeilWilliam P Atanacio Melnyk, PA-C 04/17/16 1122

## 2016-04-17 NOTE — ED Notes (Signed)
Patient's right upper eyelid began swelling yesterday in the AM. The right upper eyelid is visibly swollen and red. Patient had an infection on her left upper eyelid one month ago and was treated with antibiotic ointment.

## 2016-04-27 ENCOUNTER — Ambulatory Visit: Payer: Self-pay | Admitting: Family

## 2016-04-27 ENCOUNTER — Encounter: Payer: Self-pay | Admitting: Physician Assistant

## 2016-04-27 VITALS — BP 130/90 | HR 84 | Temp 98.3°F

## 2016-04-27 DIAGNOSIS — R350 Frequency of micturition: Secondary | ICD-10-CM

## 2016-04-27 LAB — POCT URINALYSIS DIPSTICK
BILIRUBIN UA: NEGATIVE
Blood, UA: NEGATIVE
Glucose, UA: NEGATIVE
Ketones, UA: NEGATIVE
LEUKOCYTES UA: NEGATIVE
NITRITE UA: NEGATIVE
PH UA: 6.5
Spec Grav, UA: 1.02
UROBILINOGEN UA: 0.2

## 2016-04-27 NOTE — Progress Notes (Signed)
S/ some frequency without dysuria , urine cloudy. Admits her po intake of fluids/ water is not good. No fever or chills. She had intermittent lower abd pain x one  while lying on the floor playing with her child.   O/ alert pleasant NAD heart rsr  Lungs clear  abd soft nontender , u/a neg   A/ frequency P increase po water / fluids that are not caffeinated. RTC prn if sxs worsen.

## 2016-06-23 DIAGNOSIS — Z3201 Encounter for pregnancy test, result positive: Secondary | ICD-10-CM | POA: Diagnosis not present

## 2016-06-23 DIAGNOSIS — Z8759 Personal history of other complications of pregnancy, childbirth and the puerperium: Secondary | ICD-10-CM | POA: Diagnosis not present

## 2016-06-29 DIAGNOSIS — Z3A01 Less than 8 weeks gestation of pregnancy: Secondary | ICD-10-CM | POA: Diagnosis not present

## 2016-06-29 DIAGNOSIS — O09291 Supervision of pregnancy with other poor reproductive or obstetric history, first trimester: Secondary | ICD-10-CM | POA: Diagnosis not present

## 2016-07-05 DIAGNOSIS — Z3201 Encounter for pregnancy test, result positive: Secondary | ICD-10-CM | POA: Diagnosis not present

## 2016-07-08 DIAGNOSIS — H60331 Swimmer's ear, right ear: Secondary | ICD-10-CM | POA: Diagnosis not present

## 2016-07-19 DIAGNOSIS — H60541 Acute eczematoid otitis externa, right ear: Secondary | ICD-10-CM | POA: Diagnosis not present

## 2016-07-29 DIAGNOSIS — Z3481 Encounter for supervision of other normal pregnancy, first trimester: Secondary | ICD-10-CM | POA: Diagnosis not present

## 2016-07-29 DIAGNOSIS — Z3483 Encounter for supervision of other normal pregnancy, third trimester: Secondary | ICD-10-CM | POA: Diagnosis not present

## 2016-07-29 DIAGNOSIS — Z36 Encounter for antenatal screening of mother: Secondary | ICD-10-CM | POA: Diagnosis not present

## 2016-07-29 LAB — OB RESULTS CONSOLE GC/CHLAMYDIA
CHLAMYDIA, DNA PROBE: NEGATIVE
Gonorrhea: NEGATIVE

## 2016-07-29 LAB — OB RESULTS CONSOLE HEPATITIS B SURFACE ANTIGEN: Hepatitis B Surface Ag: NEGATIVE

## 2016-07-29 LAB — OB RESULTS CONSOLE ANTIBODY SCREEN: Antibody Screen: NEGATIVE

## 2016-07-29 LAB — OB RESULTS CONSOLE RUBELLA ANTIBODY, IGM: Rubella: IMMUNE

## 2016-07-29 LAB — OB RESULTS CONSOLE ABO/RH: RH TYPE: POSITIVE

## 2016-07-29 LAB — OB RESULTS CONSOLE RPR: RPR: NONREACTIVE

## 2016-07-29 LAB — OB RESULTS CONSOLE HIV ANTIBODY (ROUTINE TESTING): HIV: NONREACTIVE

## 2016-08-05 DIAGNOSIS — Z3481 Encounter for supervision of other normal pregnancy, first trimester: Secondary | ICD-10-CM | POA: Diagnosis not present

## 2016-08-05 DIAGNOSIS — Z36 Encounter for antenatal screening of mother: Secondary | ICD-10-CM | POA: Diagnosis not present

## 2016-08-05 DIAGNOSIS — Z3491 Encounter for supervision of normal pregnancy, unspecified, first trimester: Secondary | ICD-10-CM | POA: Diagnosis not present

## 2016-08-05 LAB — HM PAP SMEAR: HM Pap smear: NEGATIVE

## 2016-08-18 DIAGNOSIS — N390 Urinary tract infection, site not specified: Secondary | ICD-10-CM | POA: Diagnosis not present

## 2016-08-25 DIAGNOSIS — Z3A13 13 weeks gestation of pregnancy: Secondary | ICD-10-CM | POA: Diagnosis not present

## 2016-08-25 DIAGNOSIS — O09291 Supervision of pregnancy with other poor reproductive or obstetric history, first trimester: Secondary | ICD-10-CM | POA: Diagnosis not present

## 2016-08-25 DIAGNOSIS — Z23 Encounter for immunization: Secondary | ICD-10-CM | POA: Diagnosis not present

## 2016-09-16 DIAGNOSIS — Z361 Encounter for antenatal screening for raised alphafetoprotein level: Secondary | ICD-10-CM | POA: Diagnosis not present

## 2016-10-01 DIAGNOSIS — O358XX0 Maternal care for other (suspected) fetal abnormality and damage, not applicable or unspecified: Secondary | ICD-10-CM | POA: Diagnosis not present

## 2016-10-01 DIAGNOSIS — Z3A18 18 weeks gestation of pregnancy: Secondary | ICD-10-CM | POA: Diagnosis not present

## 2016-10-07 DIAGNOSIS — B078 Other viral warts: Secondary | ICD-10-CM | POA: Diagnosis not present

## 2016-10-29 DIAGNOSIS — Z3A22 22 weeks gestation of pregnancy: Secondary | ICD-10-CM | POA: Diagnosis not present

## 2016-10-29 DIAGNOSIS — O358XX0 Maternal care for other (suspected) fetal abnormality and damage, not applicable or unspecified: Secondary | ICD-10-CM | POA: Diagnosis not present

## 2016-11-01 ENCOUNTER — Encounter: Payer: Self-pay | Admitting: Physician Assistant

## 2016-11-01 ENCOUNTER — Ambulatory Visit: Payer: Self-pay | Admitting: Physician Assistant

## 2016-11-01 VITALS — BP 120/80 | HR 80 | Temp 98.7°F

## 2016-11-01 DIAGNOSIS — J0100 Acute maxillary sinusitis, unspecified: Secondary | ICD-10-CM

## 2016-11-01 MED ORDER — AMOXICILLIN 875 MG PO TABS: 875.0000 mg | ORAL_TABLET | Freq: Two times a day (BID) | ORAL | 0 refills | Status: DC

## 2016-11-01 NOTE — Progress Notes (Signed)
S: C/o runny nose and congestion for 3 days, no fever, chills, cp/sob, v/d; mucus is green and thick, cough is sporadic, c/o of facial and dental pain. Pt is 5 months preg, no complications  Using otc meds:   O: PE: vitals wnl, nad, perrl eomi, normocephalic, tms dull, nasal mucosa red and swollen, throat injected, neck supple no lymph, lungs c t a, cv rrr, neuro intact  A:  Acute sinusitis   P: drink fluids, continue regular meds , use otc meds of choice, return if not improving in 5 days, return earlier if worsening , amoxil 875mg  bid

## 2016-11-17 ENCOUNTER — Ambulatory Visit: Payer: Self-pay | Admitting: Physician Assistant

## 2016-11-17 ENCOUNTER — Encounter: Payer: Self-pay | Admitting: Physician Assistant

## 2016-11-17 VITALS — BP 104/80 | HR 84 | Temp 98.2°F

## 2016-11-17 DIAGNOSIS — H01004 Unspecified blepharitis left upper eyelid: Principal | ICD-10-CM

## 2016-11-17 DIAGNOSIS — H01001 Unspecified blepharitis right upper eyelid: Secondary | ICD-10-CM

## 2016-11-17 MED ORDER — ERYTHROMYCIN 5 MG/GM OP OINT: TOPICAL_OINTMENT | OPHTHALMIC | 0 refills | Status: DC

## 2016-11-17 NOTE — Progress Notes (Signed)
S: states her eyelids are swollen, noticed last night, put a heat pack on them, took some benadryl which helped, area is spreading, ?if used a new sponge when applying makeup, no other new exposures  O: Vitals wnl, nad, upper and lower lids are pink and swollen, some pink area in orbital area beneath r eye, no crusting or drainage, conjunctiva wnl, neck supple no lymph, lungs c ta , cv rrr  A: acute blepharitis vs contact dermatitis  P: e-mycin opth ointmnet, continue otc allergy meds

## 2016-12-10 DIAGNOSIS — Z3689 Encounter for other specified antenatal screening: Secondary | ICD-10-CM | POA: Diagnosis not present

## 2016-12-14 ENCOUNTER — Other Ambulatory Visit: Payer: Self-pay | Admitting: Obstetrics and Gynecology

## 2016-12-16 ENCOUNTER — Encounter: Payer: Self-pay | Admitting: *Deleted

## 2016-12-16 ENCOUNTER — Ambulatory Visit (HOSPITAL_COMMUNITY)
Admission: RE | Admit: 2016-12-16 | Discharge: 2016-12-16 | Disposition: A | Discharge status: Home / Self Care | Payer: 59 | Source: Ambulatory Visit | Attending: Family Medicine | Admitting: Family Medicine

## 2016-12-16 DIAGNOSIS — D573 Sickle-cell trait: Secondary | ICD-10-CM | POA: Insufficient documentation

## 2016-12-16 DIAGNOSIS — O99013 Anemia complicating pregnancy, third trimester: Secondary | ICD-10-CM | POA: Insufficient documentation

## 2016-12-16 DIAGNOSIS — Z3A29 29 weeks gestation of pregnancy: Secondary | ICD-10-CM | POA: Diagnosis not present

## 2016-12-16 MED ORDER — SODIUM CHLORIDE 0.9 % IV SOLN
510.0000 mg | Freq: Once | INTRAVENOUS | Status: AC
  Administered 2016-12-16: 510 mg via INTRAVENOUS
  Filled 2016-12-16: qty 17

## 2016-12-16 NOTE — Progress Notes (Signed)
Called to come to [facility] for a G4P2  29.3 weeks who is receiving Iron infusion, for fetal monitoring pre and post infusion. Called Dr Cherly Hensenousins for orders for NST. Pt states baby is moving well, denies any bleeding, leaking or contractions.

## 2016-12-16 NOTE — Progress Notes (Addendum)
Provider : Wynonia MustyE Sonnenberg  Procedure: Patient 29.[redacted] week pregnant received Feraheme IV infusion. Patient tolerated well. Patient was observed for 30 minutes post transfusion.  Went over discharge instructions with patient and she states an understanding. Patient alert, orient, and ambulatory at time of discharge.

## 2016-12-21 DIAGNOSIS — Z3A30 30 weeks gestation of pregnancy: Secondary | ICD-10-CM | POA: Diagnosis not present

## 2016-12-21 DIAGNOSIS — O3663X0 Maternal care for excessive fetal growth, third trimester, not applicable or unspecified: Secondary | ICD-10-CM | POA: Diagnosis not present

## 2016-12-21 DIAGNOSIS — Z23 Encounter for immunization: Secondary | ICD-10-CM | POA: Diagnosis not present

## 2016-12-22 ENCOUNTER — Other Ambulatory Visit: Payer: Self-pay | Admitting: Obstetrics and Gynecology

## 2016-12-23 ENCOUNTER — Ambulatory Visit (HOSPITAL_COMMUNITY)
Admission: RE | Admit: 2016-12-23 | Discharge: 2016-12-23 | Disposition: A | Discharge status: Home / Self Care | Payer: 59 | Source: Ambulatory Visit | Attending: Family Medicine | Admitting: Family Medicine

## 2016-12-23 ENCOUNTER — Other Ambulatory Visit: Payer: Self-pay | Admitting: Obstetrics and Gynecology

## 2016-12-23 DIAGNOSIS — D573 Sickle-cell trait: Secondary | ICD-10-CM | POA: Diagnosis not present

## 2016-12-23 DIAGNOSIS — O99013 Anemia complicating pregnancy, third trimester: Secondary | ICD-10-CM | POA: Diagnosis not present

## 2016-12-23 DIAGNOSIS — Z3A29 29 weeks gestation of pregnancy: Secondary | ICD-10-CM | POA: Diagnosis not present

## 2016-12-23 MED ORDER — SODIUM CHLORIDE 0.9 % IV SOLN
510.0000 mg | Freq: Once | INTRAVENOUS | Status: AC
  Administered 2016-12-23: 510 mg via INTRAVENOUS
  Filled 2016-12-23: qty 17

## 2016-12-23 NOTE — Progress Notes (Signed)
OB rapid response nurse Kristen notified of patient's arrival.  She is with another patient and not able to come over. Baxter [facility] Cherly Hensen[facility]ins to verify if monitoring is required.  Baxter HireKristen will call me back to clarify.

## 2016-12-23 NOTE — Progress Notes (Signed)
Called and spoke with Dr Cherly Hensenousins to inform her that I am unable to [facility] of her pt who is receiving an iron infusion, Per Dr Cherly Hensenousins ok for pt to receive iron infusion without fetal monitoring. Called Tiffany RN at [facility] to inform her of Dr Cherly Hensenousins ok with pt getting infusion.

## 2016-12-23 NOTE — Progress Notes (Signed)
Provider : Dr. Cherly Hensenousins  Procedure: Patient received Feraheme IV infusion.  Patient tolerated well.   Patient was observed for 30 minutes post transfusion.  Vital signs stable Pt voiced understanding of discharge instructions. Patient alert, orient, and ambulatory at  discharge.

## 2017-01-10 ENCOUNTER — Other Ambulatory Visit: Payer: Self-pay | Admitting: Obstetrics and Gynecology

## 2017-01-18 DIAGNOSIS — O3663X0 Maternal care for excessive fetal growth, third trimester, not applicable or unspecified: Secondary | ICD-10-CM | POA: Diagnosis not present

## 2017-01-18 DIAGNOSIS — Z3A34 34 weeks gestation of pregnancy: Secondary | ICD-10-CM | POA: Diagnosis not present

## 2017-01-24 ENCOUNTER — Ambulatory Visit: Payer: Self-pay | Admitting: Physician Assistant

## 2017-01-24 VITALS — BP 108/80 | HR 107 | Temp 98.4°F

## 2017-01-24 DIAGNOSIS — J029 Acute pharyngitis, unspecified: Secondary | ICD-10-CM

## 2017-01-24 LAB — POCT RAPID STREP A (OFFICE): RAPID STREP A SCREEN: NEGATIVE

## 2017-01-24 NOTE — Progress Notes (Signed)
S: c/o sore throat, no fever/chills, having hot flashes but thinks that is from the pregnancy, is going to have a c-section in mid march, daughter had strep last week, just wants to make sure she isn't contagious, no cough, cp/sob, baby is moving well  O: vitals wnl, nad, tms clear, throat a little red, neck supple no lymph, lungs c tj a, cv rrr, q strep neg  A: sore throat  P: reassurance, gargle with warm salt water, return if worsening

## 2017-02-02 DIAGNOSIS — Z369 Encounter for antenatal screening, unspecified: Secondary | ICD-10-CM | POA: Diagnosis not present

## 2017-02-02 DIAGNOSIS — Z3685 Encounter for antenatal screening for Streptococcus B: Secondary | ICD-10-CM | POA: Diagnosis not present

## 2017-02-02 LAB — OB RESULTS CONSOLE GBS: GBS: NEGATIVE

## 2017-02-18 DIAGNOSIS — Z3A38 38 weeks gestation of pregnancy: Secondary | ICD-10-CM | POA: Diagnosis not present

## 2017-02-18 DIAGNOSIS — O3663X0 Maternal care for excessive fetal growth, third trimester, not applicable or unspecified: Secondary | ICD-10-CM | POA: Diagnosis not present

## 2017-02-21 ENCOUNTER — Encounter (HOSPITAL_COMMUNITY): Payer: Self-pay

## 2017-02-24 ENCOUNTER — Encounter (HOSPITAL_COMMUNITY)
Admission: RE | Admit: 2017-02-24 | Discharge: 2017-02-24 | Disposition: A | Discharge status: Home / Self Care | Payer: 59 | Source: Ambulatory Visit | Attending: Obstetrics and Gynecology | Admitting: Obstetrics and Gynecology

## 2017-02-24 DIAGNOSIS — N898 Other specified noninflammatory disorders of vagina: Secondary | ICD-10-CM | POA: Diagnosis not present

## 2017-02-24 DIAGNOSIS — O34211 Maternal care for low transverse scar from previous cesarean delivery: Secondary | ICD-10-CM | POA: Diagnosis not present

## 2017-02-24 DIAGNOSIS — Z6841 Body Mass Index (BMI) 40.0 and over, adult: Secondary | ICD-10-CM | POA: Diagnosis not present

## 2017-02-24 DIAGNOSIS — O3663X0 Maternal care for excessive fetal growth, third trimester, not applicable or unspecified: Secondary | ICD-10-CM | POA: Diagnosis not present

## 2017-02-24 DIAGNOSIS — Z302 Encounter for sterilization: Secondary | ICD-10-CM | POA: Diagnosis not present

## 2017-02-24 DIAGNOSIS — Z3A39 39 weeks gestation of pregnancy: Secondary | ICD-10-CM | POA: Diagnosis not present

## 2017-02-24 DIAGNOSIS — O99214 Obesity complicating childbirth: Secondary | ICD-10-CM | POA: Diagnosis not present

## 2017-02-24 DIAGNOSIS — O1205 Gestational edema, complicating the puerperium: Secondary | ICD-10-CM | POA: Diagnosis not present

## 2017-02-24 LAB — CBC
HEMATOCRIT: 37 % (ref 36.0–46.0)
HEMOGLOBIN: 11.9 g/dL — AB (ref 12.0–15.0)
MCH: 28.3 pg (ref 26.0–34.0)
MCHC: 32.2 g/dL (ref 30.0–36.0)
MCV: 88.1 fL (ref 78.0–100.0)
Platelets: 179 10*3/uL (ref 150–400)
RBC: 4.2 MIL/uL (ref 3.87–5.11)
RDW: 17.1 % — ABNORMAL HIGH (ref 11.5–15.5)
WBC: 9.7 10*3/uL (ref 4.0–10.5)

## 2017-02-24 LAB — TYPE AND SCREEN
ABO/RH(D): O POS
ANTIBODY SCREEN: NEGATIVE

## 2017-02-24 NOTE — Patient Instructions (Signed)
Kim Garcia  02/24/2017   Your procedure is scheduled on:  02/25/2017  [facility].  Pick up the phone at the desk and dial [phone].   Call this number if you have problems the morning of surgery: [phone]   Remember:   Do not eat food:After Midnight.  Do not drink clear liquids: After Midnight.  Take these medicines the morning of surgery with A SIP OF WATER: may take zyrtec and please bring your inhaler   Do not wear jewelry, make-up or nail polish.  Do not wear lotions, powders, or perfumes. Do not wear deodorant.  Do not shave 48 hours prior to surgery.  Do not bring valuables to the hospital.  [facility] not   responsible for any belongings or valuables brought to the hospital.  Contacts, dentures or bridgework may not be worn into surgery.  Leave suitcase in the car. After surgery it may be brought to your room.  For patients admitted to the hospital, checkout time is 11:00 AM the day of              discharge.   Patients discharged the day of surgery will not be allowed to drive             home.  Name and phone number of your driver: na  Special Instructions:   N/A   Please read over the following fact sheets that you were given:   Surgical Site Infection Prevention

## 2017-02-25 ENCOUNTER — Encounter (HOSPITAL_COMMUNITY): Payer: Self-pay | Admitting: *Deleted

## 2017-02-25 ENCOUNTER — Encounter (HOSPITAL_COMMUNITY)
Admission: RE | Disposition: A | Discharge status: Home / Self Care | Payer: Self-pay | Source: Ambulatory Visit | Attending: Obstetrics and Gynecology

## 2017-02-25 ENCOUNTER — Inpatient Hospital Stay (HOSPITAL_COMMUNITY)
Admission: RE | Admit: 2017-02-25 | Discharge: 2017-02-28 | DRG: 765 | Disposition: A | Discharge status: Home / Self Care | Payer: 59 | Source: Ambulatory Visit | Attending: Obstetrics and Gynecology | Admitting: Obstetrics and Gynecology

## 2017-02-25 ENCOUNTER — Inpatient Hospital Stay (HOSPITAL_COMMUNITY): Payer: 59 | Admitting: Anesthesiology

## 2017-02-25 DIAGNOSIS — Z6841 Body Mass Index (BMI) 40.0 and over, adult: Secondary | ICD-10-CM

## 2017-02-25 DIAGNOSIS — O34219 Maternal care for unspecified type scar from previous cesarean delivery: Secondary | ICD-10-CM | POA: Diagnosis not present

## 2017-02-25 DIAGNOSIS — Z3A39 39 weeks gestation of pregnancy: Secondary | ICD-10-CM | POA: Diagnosis not present

## 2017-02-25 DIAGNOSIS — Z302 Encounter for sterilization: Secondary | ICD-10-CM

## 2017-02-25 DIAGNOSIS — O34211 Maternal care for low transverse scar from previous cesarean delivery: Principal | ICD-10-CM | POA: Diagnosis present

## 2017-02-25 DIAGNOSIS — O99214 Obesity complicating childbirth: Secondary | ICD-10-CM | POA: Diagnosis present

## 2017-02-25 DIAGNOSIS — O1205 Gestational edema, complicating the puerperium: Secondary | ICD-10-CM | POA: Diagnosis not present

## 2017-02-25 LAB — RPR: RPR: NONREACTIVE

## 2017-02-25 SURGERY — Surgical Case
Anesthesia: Spinal

## 2017-02-25 MED ORDER — FENTANYL CITRATE (PF) 100 MCG/2ML IJ SOLN
INTRAMUSCULAR | Status: AC
  Filled 2017-02-25: qty 2

## 2017-02-25 MED ORDER — IBUPROFEN 600 MG PO TABS
600.0000 mg | ORAL_TABLET | Freq: Four times a day (QID) | ORAL | Status: DC
  Administered 2017-02-26 – 2017-02-28 (×11): 600 mg via ORAL
  Filled 2017-02-25 (×11): qty 1

## 2017-02-25 MED ORDER — DIBUCAINE 1 % RE OINT: 1.0000 "application " | TOPICAL_OINTMENT | RECTAL | Status: DC | PRN

## 2017-02-25 MED ORDER — NALOXONE HCL 0.4 MG/ML IJ SOLN: 0.4000 mg | INTRAMUSCULAR | Status: DC | PRN

## 2017-02-25 MED ORDER — OXYCODONE-ACETAMINOPHEN 5-325 MG PO TABS
1.0000 | ORAL_TABLET | ORAL | Status: DC | PRN
  Administered 2017-02-26 – 2017-02-28 (×7): 1 via ORAL
  Filled 2017-02-25 (×6): qty 1

## 2017-02-25 MED ORDER — ZOLPIDEM TARTRATE 5 MG PO TABS: 5.0000 mg | ORAL_TABLET | Freq: Every evening | ORAL | Status: DC | PRN

## 2017-02-25 MED ORDER — KETOROLAC TROMETHAMINE 30 MG/ML IJ SOLN: 30.0000 mg | Freq: Four times a day (QID) | INTRAMUSCULAR | Status: AC | PRN

## 2017-02-25 MED ORDER — OXYCODONE-ACETAMINOPHEN 5-325 MG PO TABS
2.0000 | ORAL_TABLET | ORAL | Status: DC | PRN
  Filled 2017-02-25: qty 2

## 2017-02-25 MED ORDER — NALBUPHINE HCL 10 MG/ML IJ SOLN: 5.0000 mg | Freq: Once | INTRAMUSCULAR | Status: DC | PRN

## 2017-02-25 MED ORDER — LACTATED RINGERS IV SOLN
INTRAVENOUS | Status: DC | PRN
  Administered 2017-02-25 (×2): via INTRAVENOUS

## 2017-02-25 MED ORDER — SIMETHICONE 80 MG PO CHEW
80.0000 mg | CHEWABLE_TABLET | Freq: Three times a day (TID) | ORAL | Status: DC
  Administered 2017-02-26 – 2017-02-28 (×7): 80 mg via ORAL
  Filled 2017-02-25 (×8): qty 1

## 2017-02-25 MED ORDER — SIMETHICONE 80 MG PO CHEW
80.0000 mg | CHEWABLE_TABLET | ORAL | Status: DC
  Administered 2017-02-26 – 2017-02-27 (×2): 80 mg via ORAL
  Filled 2017-02-25 (×3): qty 1

## 2017-02-25 MED ORDER — PHENYLEPHRINE 8 MG IN D5W 100 ML (0.08MG/ML) PREMIX OPTIME
INJECTION | INTRAVENOUS | Status: AC
  Filled 2017-02-25: qty 100

## 2017-02-25 MED ORDER — DIPHENHYDRAMINE HCL 25 MG PO CAPS
25.0000 mg | ORAL_CAPSULE | ORAL | Status: DC | PRN
  Administered 2017-02-26: 25 mg via ORAL

## 2017-02-25 MED ORDER — PHENYLEPHRINE 8 MG IN D5W 100 ML (0.08MG/ML) PREMIX OPTIME
INJECTION | INTRAVENOUS | Status: DC | PRN
  Administered 2017-02-25: 60 ug/min via INTRAVENOUS

## 2017-02-25 MED ORDER — SENNOSIDES-DOCUSATE SODIUM 8.6-50 MG PO TABS
2.0000 | ORAL_TABLET | ORAL | Status: DC
  Administered 2017-02-26 – 2017-02-27 (×3): 2 via ORAL
  Filled 2017-02-25 (×3): qty 2

## 2017-02-25 MED ORDER — WITCH HAZEL-GLYCERIN EX PADS: 1.0000 "application " | MEDICATED_PAD | CUTANEOUS | Status: DC | PRN

## 2017-02-25 MED ORDER — ONDANSETRON HCL 4 MG/2ML IJ SOLN
INTRAMUSCULAR | Status: AC
  Filled 2017-02-25: qty 2

## 2017-02-25 MED ORDER — SODIUM CHLORIDE 0.9% FLUSH: 3.0000 mL | INTRAVENOUS | Status: DC | PRN

## 2017-02-25 MED ORDER — DIPHENHYDRAMINE HCL 25 MG PO CAPS
25.0000 mg | ORAL_CAPSULE | Freq: Four times a day (QID) | ORAL | Status: DC | PRN
  Filled 2017-02-25: qty 1

## 2017-02-25 MED ORDER — OXYTOCIN 10 UNIT/ML IJ SOLN
INTRAVENOUS | Status: DC | PRN
  Administered 2017-02-25: 40 [IU] via INTRAVENOUS

## 2017-02-25 MED ORDER — MORPHINE SULFATE (PF) 0.5 MG/ML IJ SOLN
INTRAMUSCULAR | Status: AC
  Filled 2017-02-25: qty 10

## 2017-02-25 MED ORDER — FENTANYL CITRATE (PF) 100 MCG/2ML IJ SOLN
INTRAMUSCULAR | Status: AC
  Administered 2017-02-25: 50 ug via INTRAVENOUS
  Filled 2017-02-25: qty 2

## 2017-02-25 MED ORDER — DEXTROSE 5 % IV SOLN
3.0000 g | INTRAVENOUS | Status: AC
  Administered 2017-02-25: 3 g via INTRAVENOUS
  Filled 2017-02-25: qty 3000

## 2017-02-25 MED ORDER — KETOROLAC TROMETHAMINE 30 MG/ML IJ SOLN
30.0000 mg | Freq: Four times a day (QID) | INTRAMUSCULAR | Status: AC | PRN
  Administered 2017-02-25: 30 mg via INTRAVENOUS
  Filled 2017-02-25: qty 1

## 2017-02-25 MED ORDER — LACTATED RINGERS IV SOLN
INTRAVENOUS | Status: DC
  Administered 2017-02-25 – 2017-02-26 (×2): via INTRAVENOUS

## 2017-02-25 MED ORDER — OXYTOCIN 10 UNIT/ML IJ SOLN
INTRAMUSCULAR | Status: AC
  Filled 2017-02-25: qty 4

## 2017-02-25 MED ORDER — SCOPOLAMINE 1 MG/3DAYS TD PT72
MEDICATED_PATCH | TRANSDERMAL | Status: DC | PRN
  Administered 2017-02-25: 1 via TRANSDERMAL

## 2017-02-25 MED ORDER — COCONUT OIL OIL
1.0000 "application " | TOPICAL_OIL | Status: DC | PRN
  Administered 2017-02-27: 1 via TOPICAL
  Filled 2017-02-25: qty 120

## 2017-02-25 MED ORDER — BUPIVACAINE HCL (PF) 0.25 % IJ SOLN
INTRAMUSCULAR | Status: AC
  Filled 2017-02-25: qty 30

## 2017-02-25 MED ORDER — SCOPOLAMINE 1 MG/3DAYS TD PT72: 1.0000 | MEDICATED_PATCH | Freq: Once | TRANSDERMAL | Status: DC

## 2017-02-25 MED ORDER — NALBUPHINE HCL 10 MG/ML IJ SOLN: 5.0000 mg | INTRAMUSCULAR | Status: DC | PRN

## 2017-02-25 MED ORDER — ONDANSETRON HCL 4 MG/2ML IJ SOLN: 4.0000 mg | Freq: Three times a day (TID) | INTRAMUSCULAR | Status: DC | PRN

## 2017-02-25 MED ORDER — FENTANYL CITRATE (PF) 100 MCG/2ML IJ SOLN
INTRAMUSCULAR | Status: DC | PRN
  Administered 2017-02-25: 10 ug via INTRATHECAL

## 2017-02-25 MED ORDER — MORPHINE SULFATE-NACL 0.5-0.9 MG/ML-% IV SOSY
PREFILLED_SYRINGE | INTRAVENOUS | Status: DC | PRN
  Administered 2017-02-25: .2 mg via INTRATHECAL

## 2017-02-25 MED ORDER — FENTANYL CITRATE (PF) 100 MCG/2ML IJ SOLN
50.0000 ug | INTRAMUSCULAR | Status: DC | PRN
  Administered 2017-02-25: 50 ug via INTRAVENOUS

## 2017-02-25 MED ORDER — DEXTROSE 5 % IV SOLN
INTRAVENOUS | Status: AC
  Filled 2017-02-25: qty 3000

## 2017-02-25 MED ORDER — SODIUM CHLORIDE 0.9 % IR SOLN
Status: DC | PRN
  Administered 2017-02-25: 1

## 2017-02-25 MED ORDER — BUPIVACAINE HCL (PF) 0.25 % IJ SOLN
INTRAMUSCULAR | Status: DC | PRN
  Administered 2017-02-25: 10 mL

## 2017-02-25 MED ORDER — LACTATED RINGERS IV SOLN
INTRAVENOUS | Status: DC | PRN
  Administered 2017-02-25: 14:00:00 via INTRAVENOUS

## 2017-02-25 MED ORDER — SCOPOLAMINE 1 MG/3DAYS TD PT72
MEDICATED_PATCH | TRANSDERMAL | Status: AC
  Filled 2017-02-25: qty 1

## 2017-02-25 MED ORDER — SIMETHICONE 80 MG PO CHEW
80.0000 mg | CHEWABLE_TABLET | ORAL | Status: DC | PRN
  Administered 2017-02-27 (×2): 80 mg via ORAL
  Filled 2017-02-25: qty 1

## 2017-02-25 MED ORDER — TETANUS-DIPHTH-ACELL PERTUSSIS 5-2.5-18.5 LF-MCG/0.5 IM SUSP: 0.5000 mL | Freq: Once | INTRAMUSCULAR | Status: DC

## 2017-02-25 MED ORDER — MENTHOL 3 MG MT LOZG
1.0000 | LOZENGE | OROMUCOSAL | Status: DC | PRN
Start: 2017-02-25 — End: 2017-02-28

## 2017-02-25 MED ORDER — PRENATAL MULTIVITAMIN CH
1.0000 | ORAL_TABLET | Freq: Every day | ORAL | Status: DC
  Administered 2017-02-26 – 2017-02-28 (×3): 1 via ORAL
  Filled 2017-02-25 (×3): qty 1

## 2017-02-25 MED ORDER — LACTATED RINGERS IV SOLN
INTRAVENOUS | Status: DC
  Administered 2017-02-25: 12:00:00 via INTRAVENOUS

## 2017-02-25 MED ORDER — BUPIVACAINE IN DEXTROSE 0.75-8.25 % IT SOLN
INTRATHECAL | Status: DC | PRN
  Administered 2017-02-25: 12 mg via INTRATHECAL

## 2017-02-25 MED ORDER — NALOXONE HCL 2 MG/2ML IJ SOSY: 1.0000 ug/kg/h | PREFILLED_SYRINGE | INTRAVENOUS | Status: DC | PRN

## 2017-02-25 MED ORDER — DIPHENHYDRAMINE HCL 50 MG/ML IJ SOLN: 12.5000 mg | INTRAMUSCULAR | Status: DC | PRN

## 2017-02-25 MED ORDER — MEPERIDINE HCL 25 MG/ML IJ SOLN: 6.2500 mg | INTRAMUSCULAR | Status: DC | PRN

## 2017-02-25 MED ORDER — ONDANSETRON HCL 4 MG/2ML IJ SOLN
INTRAMUSCULAR | Status: DC | PRN
  Administered 2017-02-25: 4 mg via INTRAVENOUS

## 2017-02-25 MED ORDER — OXYTOCIN 40 UNITS IN LACTATED RINGERS INFUSION - SIMPLE MED: 2.5000 [IU]/h | INTRAVENOUS | Status: AC

## 2017-02-25 SURGICAL SUPPLY — 46 items
BARRIER ADHS 3X4 INTERCEED (GAUZE/BANDAGES/DRESSINGS) ×3 IMPLANT
BENZOIN TINCTURE PRP APPL 2/3 (GAUZE/BANDAGES/DRESSINGS) IMPLANT
CLAMP CORD UMBIL (MISCELLANEOUS) IMPLANT
CLOSURE WOUND 1/2 X4 (GAUZE/BANDAGES/DRESSINGS)
CLOTH BEACON ORANGE TIMEOUT ST (SAFETY) ×3 IMPLANT
CONTAINER PREFILL 10% NBF 15ML (MISCELLANEOUS) IMPLANT
DRAPE C SECTION CLR SCREEN (DRAPES) ×3 IMPLANT
DRSG OPSITE 11X17.75 LRG (GAUZE/BANDAGES/DRESSINGS) ×3 IMPLANT
DRSG OPSITE POSTOP 4X10 (GAUZE/BANDAGES/DRESSINGS) ×3 IMPLANT
DURAPREP 26ML APPLICATOR (WOUND CARE) ×3 IMPLANT
ELECT REM PT RETURN 9FT ADLT (ELECTROSURGICAL) ×3
ELECTRODE REM PT RTRN 9FT ADLT (ELECTROSURGICAL) ×1 IMPLANT
EXTRACTOR VACUUM M CUP 4 TUBE (SUCTIONS) IMPLANT
EXTRACTOR VACUUM M CUP 4' TUBE (SUCTIONS)
GLOVE BIOGEL PI IND STRL 7.0 (GLOVE) ×2 IMPLANT
GLOVE BIOGEL PI INDICATOR 7.0 (GLOVE) ×4
GLOVE ECLIPSE 6.5 STRL STRAW (GLOVE) ×3 IMPLANT
GOWN STRL REUS W/TWL LRG LVL3 (GOWN DISPOSABLE) ×6 IMPLANT
KIT ABG SYR 3ML LUER SLIP (SYRINGE) IMPLANT
NEEDLE HYPO 22GX1.5 SAFETY (NEEDLE) ×3 IMPLANT
NEEDLE HYPO 25X5/8 SAFETYGLIDE (NEEDLE) IMPLANT
NS IRRIG 1000ML POUR BTL (IV SOLUTION) ×3 IMPLANT
PACK C SECTION WH (CUSTOM PROCEDURE TRAY) ×3 IMPLANT
PAD OB MATERNITY 4.3X12.25 (PERSONAL CARE ITEMS) ×3 IMPLANT
RTRCTR C-SECT PINK 25CM LRG (MISCELLANEOUS) IMPLANT
STAPLER VISISTAT 35W (STAPLE) ×3 IMPLANT
STRIP CLOSURE SKIN 1/2X4 (GAUZE/BANDAGES/DRESSINGS) IMPLANT
SUT CHROMIC GUT AB #0 18 (SUTURE) IMPLANT
SUT MNCRL 0 VIOLET CTX 36 (SUTURE) ×3 IMPLANT
SUT MON AB 2-0 SH 27 (SUTURE)
SUT MON AB 2-0 SH27 (SUTURE) IMPLANT
SUT MON AB 3-0 SH 27 (SUTURE)
SUT MON AB 3-0 SH27 (SUTURE) IMPLANT
SUT MON AB 4-0 PS1 27 (SUTURE) IMPLANT
SUT MONOCRYL 0 CTX 36 (SUTURE) ×6
SUT PLAIN 2 0 (SUTURE)
SUT PLAIN 2 0 XLH (SUTURE) IMPLANT
SUT PLAIN ABS 2-0 CT1 27XMFL (SUTURE) IMPLANT
SUT VIC AB 0 CT1 36 (SUTURE) ×6 IMPLANT
SUT VIC AB 2-0 CT1 27 (SUTURE) ×2
SUT VIC AB 2-0 CT1 TAPERPNT 27 (SUTURE) ×1 IMPLANT
SUT VIC AB 4-0 PS2 27 (SUTURE) IMPLANT
SYR CONTROL 10ML LL (SYRINGE) ×3 IMPLANT
TOWEL OR 17X24 6PK STRL BLUE (TOWEL DISPOSABLE) ×3 IMPLANT
TRAY FOLEY CATH SILVER 14FR (SET/KITS/TRAYS/PACK) IMPLANT
YANKAUER SUCT BULB TIP NO VENT (SUCTIONS) ×3 IMPLANT

## 2017-02-25 NOTE — Brief Op Note (Signed)
02/25/2017  2:28 PM  PATIENT:  Kim Garcia  31 y.o. female  PRE-OPERATIVE DIAGNOSIS:  Previous Cesarean Section, Desires Sterilization, term gestation, morbid obesity  POST-OPERATIVE DIAGNOSIS:  Previous Cesarean Section, Desires Sterilization, term gestation, morbid obesity  PROCEDURE:  Repeat cesarean section, kerr hysterotomy. Modified pomeroy tubal ligation  SURGEON:  Surgeon(s) and Role:    * Maxie BetterSheronette Kaijah Abts, MD - Primary  PHYSICIAN ASSISTANT:   ASSISTANTS: Arlan Organaniela Paul, CNM   ANESTHESIA:   spinal Findings: copious clear AF, floating vtx ROT, nl tubes and ovaries, left paratubal cyst, live female, ant placenta  EBL:  Total I/O In: 2000 [I.V.:2000] Out: 850 [Urine:150; Blood:700]  BLOOD ADMINISTERED:none  DRAINS: none   LOCAL MEDICATIONS USED:  MARCAINE     SPECIMEN:  Source of Specimen:  portion of right and left fallopian tube  DISPOSITION OF SPECIMEN:  PATHOLOGY  COUNTS:  YES  TOURNIQUET:  * No tourniquets in log *  DICTATION: .Other Dictation: Dictation Number [id number]  PLAN OF CARE: Admit to inpatient   PATIENT DISPOSITION:  PACU - hemodynamically stable.   Delay start of Pharmacological VTE agent (>24hrs) due to surgical blood loss or risk of bleeding: not applicable

## 2017-02-25 NOTE — Anesthesia Postprocedure Evaluation (Signed)
Anesthesia Post Note  Patient: Kim Garcia  Procedure(s) Performed: Procedure(s) (LRB): REPEAT CESAREAN SECTION WITH BILATERAL TUBAL LIGATION (N/A)  Patient location during evaluation: Mother Baby Anesthesia Type: Spinal Level of consciousness: awake, awake and alert, oriented and patient cooperative Pain management: pain level controlled Vital Signs Assessment: post-procedure vital signs reviewed and stable Respiratory status: spontaneous breathing, nonlabored ventilation and respiratory function stable Cardiovascular status: stable Postop Assessment: no headache, no backache, spinal receding, patient able to bend at knees and no signs of nausea or vomiting Anesthetic complications: no        Last Vitals:  Vitals:   02/25/17 1545 02/25/17 1620  BP: 107/62 120/75  Pulse:  96  Resp: 14 18  Temp: 36.7 C 36.9 C    Last Pain:  Vitals:   02/25/17 1620  TempSrc: Oral  PainSc: 2    Pain Goal:                 [name] L

## 2017-02-25 NOTE — Transfer of Care (Signed)
Immediate Anesthesia Transfer of Care Note  Patient: Kim Garcia  Procedure(s) Performed: Procedure(s) with comments: REPEAT CESAREAN SECTION WITH BILATERAL TUBAL LIGATION (N/A) - EDD: 02/28/17  Patient Location: PACU  Anesthesia Type:Spinal  Level of Consciousness: awake  Airway & Oxygen Therapy: Patient Spontanous Breathing  Post-op Assessment: Report given to RN and Post -op Vital signs reviewed and stable  Post vital signs: stable  Last Vitals:  Vitals:   02/25/17 1105  BP: (!) 122/94  Pulse: (!) 107  Resp: 20  Temp: 36.9 C    Last Pain:  Vitals:   02/25/17 1105  TempSrc: Oral         Complications: No apparent anesthesia complications

## 2017-02-25 NOTE — Progress Notes (Signed)
MOB was referred for history of depression/anxiety. * Referral screened out by Clinical Social Worker because none of the following criteria appear to apply: ~ History of anxiety/depression during this pregnancy, or of post-partum depression. ~ Diagnosis of anxiety and/or depression within last 3 years OR * MOB's symptoms currently being treated with medication and/or therapy. Please contact the Clinical Social Worker if needs arise, or if MOB requests.   

## 2017-02-25 NOTE — Anesthesia Preprocedure Evaluation (Addendum)
Anesthesia Evaluation  Patient identified by MRN, date of birth, ID band Patient awake    Reviewed: Allergy & Precautions, H&P , NPO status , Patient's Chart, lab work & pertinent test results  Airway Mallampati: III  TM Distance: >3 FB Neck ROM: full    Dental no notable dental hx.    Pulmonary asthma ,    Pulmonary exam normal        Cardiovascular negative cardio ROS Normal cardiovascular exam     Neuro/Psych Depression negative neurological ROS     GI/Hepatic negative GI ROS, Neg liver ROS,   Endo/Other  negative endocrine ROSMorbid obesity  Renal/GU negative Renal ROS     Musculoskeletal   Abdominal (+) + obese,   Peds  Hematology   Anesthesia Other Findings   Reproductive/Obstetrics (+) Pregnancy                             Anesthesia Physical  Anesthesia Plan  ASA: III  Anesthesia Plan: Spinal   Post-op Pain Management:    Induction:   Airway Management Planned:   Additional Equipment:   Intra-op Plan:   Post-operative Plan:   Informed Consent: I have reviewed the patients History and Physical, chart, labs and discussed the procedure including the risks, benefits and alternatives for the proposed anesthesia with the patient or authorized representative who has indicated his/her understanding and acceptance.     Plan Discussed with: CRNA, Anesthesiologist and Surgeon  Anesthesia Plan Comments:         Anesthesia Quick Evaluation

## 2017-02-25 NOTE — Lactation Note (Signed)
This note was copied from a baby's chart. Lactation Consultation Note  Patient Name: Girl Jake SharkSara Smullen ZOXWR'UToday's Date: 02/25/2017 Reason for consult: Initial assessment  Assisted with first breastfeeding in PACU at 1 hr. 3rd baby girl born at 762w4d, weighing 9 lbs 9 oz.  Baby latched in laid back position with little assistance.   Basic breastfeeding reviewed.  Importance of STS and cue based feedings, goal of 8-12 feedings per 24 hrs.   Mom aware of IP lactation assistance available to her, encouraged her to call for assistance as needed. Lactation to follow up tomorrow 3/17  Maternal Data Has patient been taught Hand Expression?: Yes Does the patient have breastfeeding experience prior to this delivery?: Yes  Feeding Feeding Type: Breast Fed  LATCH Score/Interventions Latch: Grasps breast easily, tongue down, lips flanged, rhythmical sucking.  Audible Swallowing: Spontaneous and intermittent  Type of Nipple: Everted at rest and after stimulation  Comfort (Breast/Nipple): Soft / non-tender     Hold (Positioning): Assistance needed to correctly position infant at breast and maintain latch.  LATCH Score: 9  Lactation Tools Discussed/Used     Consult Status Consult Status: Follow-up Date: 02/26/17 Follow-up type: In-patient    Judee ClaraSmith, Deztinee Lohmeyer E 02/25/2017, 3:24 PM

## 2017-02-25 NOTE — Addendum Note (Signed)
Addendum  created 02/25/17 1648 by Yolonda KidaAlison L Abdelrahman Nair, CRNA   Sign clinical note

## 2017-02-25 NOTE — Anesthesia Procedure Notes (Signed)
Spinal  Patient location during procedure: OB Start time: 02/25/2017 1:04 PM End time: 02/25/2017 1:09 PM Staffing Anesthesiologist: Anitra LauthMILLER, WARREN RAY Performed: anesthesiologist  Preanesthetic Checklist Completed: patient identified, surgical consent, pre-op evaluation, timeout performed, IV checked, risks and benefits discussed and monitors and equipment checked Spinal Block Patient position: sitting Prep: Betadine and site prepped and draped Patient monitoring: heart rate, cardiac monitor, continuous pulse ox and blood pressure Approach: midline Location: L3-4 Injection technique: single-shot Needle Needle type: Sprotte  Needle gauge: 24 G Needle length: 12.7 cm Assessment Sensory level: T4

## 2017-02-25 NOTE — Anesthesia Postprocedure Evaluation (Signed)
Anesthesia Post Note  Patient: Kim Garcia  Procedure(s) Performed: Procedure(s) (LRB): REPEAT CESAREAN SECTION WITH BILATERAL TUBAL LIGATION (N/A)  Patient location during evaluation: PACU Anesthesia Type: Spinal Level of consciousness: oriented and awake and alert Pain management: pain level controlled Vital Signs Assessment: post-procedure vital signs reviewed and stable Respiratory status: spontaneous breathing and respiratory function stable Cardiovascular status: blood pressure returned to baseline and stable Postop Assessment: no headache and no backache Anesthetic complications: no        Last Vitals:  Vitals:   02/25/17 1530 02/25/17 1545  BP: 118/62 107/62  Pulse: 97   Resp: 17 14  Temp:  36.7 C    Last Pain:  Vitals:   02/25/17 1515  TempSrc:   PainSc: 3    Pain Goal:                 Lowella CurbWarren Ray Iokepa Geffre

## 2017-02-26 ENCOUNTER — Encounter (HOSPITAL_COMMUNITY): Payer: Self-pay | Admitting: *Deleted

## 2017-02-26 LAB — CBC
HCT: 34.6 % — ABNORMAL LOW (ref 36.0–46.0)
HEMOGLOBIN: 11 g/dL — AB (ref 12.0–15.0)
MCH: 28.4 pg (ref 26.0–34.0)
MCHC: 31.8 g/dL (ref 30.0–36.0)
MCV: 89.4 fL (ref 78.0–100.0)
PLATELETS: 155 10*3/uL (ref 150–400)
RBC: 3.87 MIL/uL (ref 3.87–5.11)
RDW: 17.3 % — ABNORMAL HIGH (ref 11.5–15.5)
WBC: 9.6 10*3/uL (ref 4.0–10.5)

## 2017-02-26 LAB — BIRTH TISSUE RECOVERY COLLECTION (PLACENTA DONATION)

## 2017-02-26 NOTE — Progress Notes (Signed)
Pt's O2 sat  Drops into the high 80 when  trying to sleep placed on 2L O2, increased to 4L, pt desaturating while alseep. Called Dr. Nonah MattesKylie Jackson and notified about pt's O2 sat. MD stated not to increase O2. When asked how much liters to keep pt on, MD did not answer but stated not to increase O2.  Will continue to assess.

## 2017-02-26 NOTE — Progress Notes (Signed)
SVD: repeat c/s, with TL  S:  Pt reports feeling well/ Tolerating po/ Voiding without problems/ No n/v/ Bleeding is moderate/ Pain controlled withnarcotic analgesics including Percocet    O:  A & O x 3 well/ VS: Blood pressure 117/63, pulse 80, temperature 97.8 F (36.6 C), temperature source Oral, resp. rate 16, height 5\' 2"  (1.575 m), weight 127 kg (280 lb), last menstrual period 04/17/2016, SpO2 91 %, unknown if currently breastfeeding.  LABS:  Results for orders placed or performed during the hospital encounter of 02/25/17 (from the past 24 hour(s))  CBC     Status: Abnormal   Collection Time: 02/26/17  5:45 AM  Result Value Ref Range   WBC 9.6 4.0 - 10.5 K/uL   RBC 3.87 3.87 - 5.11 MIL/uL   Hemoglobin 11.0 (L) 12.0 - 15.0 g/dL   HCT 16.134.6 (L) 09.636.0 - 04.546.0 %   MCV 89.4 78.0 - 100.0 fL   MCH 28.4 26.0 - 34.0 pg   MCHC 31.8 30.0 - 36.0 g/dL   RDW 40.917.3 (H) 81.111.5 - 91.415.5 %   Platelets 155 150 - 400 K/uL  Collect bld for placenta donatation     Status: None   Collection Time: 02/26/17  5:45 AM  Result Value Ref Range   Placenta donation bld collect COLLECTED BY LABORATORY     I&O: I/O last 3 completed shifts: In: 5423.8 [I.V.:3973.8; Other:1450] Out: 1910 [Urine:910; Emesis/NG output:500; Blood:500]   No intake/output data recorded.  Lungs: chest clear, no wheezing, rales, normal symmetric air entry, Heart exam - S1, S2 normal, no murmur, no gallop, rate regular  Heart: regular rate and rhythm, S1, S2 normal, no murmur, click, rub or gallop  Abdomen: obese, soft, uterus at umb firm  Perineum: is normal  Lochia: mod  Extremities:no redness or tenderness in the calves or thighs, edema 2+    A/P: POD # 1/PPD # 1/ [id number]  Doing well  Continue routine post partum orders

## 2017-02-26 NOTE — Progress Notes (Signed)
Pt O2 levels dropping to 80s while sleeping and on 4L of oxygen.  Anesthesia notified, recommended trying a face mask, and discussing with OB if continued problem.  Will continue to monitor.

## 2017-02-26 NOTE — Plan of Care (Signed)
Problem: Activity: Goal: Risk for activity intolerance will decrease Outcome: Completed/Met Date Met: 02/26/17 Patient tolerating ambulation, walking well in room.  Patient encouraged to walk in the halls today.  Problem: Nutritional: Goal: Dietary intake will improve Outcome: Completed/Met Date Met: 02/26/17 Pt tolerating food well over night and this morning.

## 2017-02-27 NOTE — Progress Notes (Signed)
Subjective: POD# 2 Information for the patient's newborn:  Kim Garcia, Kim Garcia [[id number]][[id number]]  female  Baby name: Kim Garcia levels and phototherapy overnight.  Reports feeling well, some swelling in legs and panus.  Feeding: breast Patient reports tolerating PO.  Breast symptoms: + colostrum Pain controlled with PO meds Denies HA/SOB/C/P/N/V/dizziness. Flatus present, + BM. She reports vaginal bleeding as normal, without clots.  She is ambulating, urinating without difficulty.     Objective:   VS:    Vitals:   02/26/17 1130 02/26/17 1430 02/26/17 1819 02/27/17 0500  BP: (!) 129/54 102/62 116/70 132/77  Pulse: 88 86 100 87  Resp: 16 16 20 18   Temp: 98 F (36.7 C) 98.6 F (37 C) 98.4 F (36.9 C) 98.4 F (36.9 C)  TempSrc: Oral Oral Axillary Oral  SpO2: 91% 95% 100%   Weight:      Height:         Intake/Output Summary (Last 24 hours) at 02/27/17 1254 Last data filed at 02/26/17 1430  Gross per 24 hour  Intake                0 ml  Output              600 ml  Net             -600 ml        Recent Labs  02/26/17 0545  WBC 9.6  HGB 11.0*  HCT 34.6*  PLT 155     Blood type: --/--/O POS (03/15 1052)  Rubella: Immune (08/17 0000)     Physical Exam:  General: alert, cooperative and no distress Abdomen: soft, NT, + BS, + edema in panus Incision: clean, intact and serous drainage present Uterine Fundus: firm, below umbilicus, nontender Lochia: minimal Ext: edema + 2 LE's bilat      Assessment/Plan: 31 y.o.   POD# 2. [id number]                  Active Problems:   Postpartum care following cesarean delivery (10/16) Indication: repeat and BTL   Doing well, stable.               Advance diet as tolerated Encourage rest when baby rests Breastfeeding support Encourage to ambulate Routine post-op care Consider HCTZ course x 1 week once milk is in for dependant edema Anticipate DC tomorrow.   Kim Garcia, CNM, MSN 02/27/2017, 12:54 PM

## 2017-02-28 MED ORDER — HYDROCHLOROTHIAZIDE 12.5 MG PO CAPS: 12.5000 mg | ORAL_CAPSULE | Freq: Every day | ORAL | 0 refills | Status: DC

## 2017-02-28 MED ORDER — IBUPROFEN 600 MG PO TABS: 600.0000 mg | ORAL_TABLET | Freq: Four times a day (QID) | ORAL | 0 refills | Status: DC

## 2017-02-28 MED ORDER — OXYCODONE-ACETAMINOPHEN 5-325 MG PO TABS: 1.0000 | ORAL_TABLET | ORAL | 0 refills | Status: DC | PRN

## 2017-02-28 NOTE — Progress Notes (Signed)
POSTOPERATIVE DAY # 3 S/P CS with BTL  S:         Reports feeling well             Tolerating po intake / no nausea / no vomiting / + flatus / + BM             Bleeding is light             Pain controlled with motrin             Up ad lib / ambulatory/ voiding QS  Newborn breast feeding   O:  VS: BP 109/60   Pulse 88   Temp 98.1 F (36.7 C) (Oral)   Resp 18   Ht 5\' 2"  (1.575 m)   Wt 127 kg (280 lb)   LMP 04/17/2016   SpO2 96%   Breastfeeding? Unknown   BMI 51.21 kg/m    LABS:               Recent Labs  02/26/17 0545  WBC 9.6  HGB 11.0*  PLT 155               Bloodtype: --/--/O POS (03/15 1052)  Rubella: Immune (08/17 0000)                                Physical Exam:             Alert and Oriented X3  Abdomen: soft, non-tender, non-distended              Fundus: firm, non-tender, Ueven             Dressing intact              Incision:  approximated with suture / no erythema / no ecchymosis / no drainage  Perineum: intact  Lochia: light  Extremities: 3+edema, no calf pain or tenderness  A:        POD # 3 S/P CS            Dependent edema  P:        Routine postoperative care              HCTZ x 7 day course             DC home    Marlinda MikeBAILEY, TANYA CNM, MSN, [facility] 02/28/2017, 8:58 AM

## 2017-02-28 NOTE — Lactation Note (Addendum)
This note was copied from a baby's chart. Lactation Consultation Note Parents awake, mom holding baby. Mom stated baby had just settled down from crying. States BF well. Mom has sore cracked and bruised nipples. Offered comfort gels. Mom stated she has coconut oil and is helpful. Will let us know when needs them.  Mom's 3rd baby to BF. Mom plans on staying home and have long term BF.  Encouraged mom to assess breast before and after BF to monitor for transfer of colostrum.  Mom didn't want to bother or move from holding baby which LC stated understood. Baby had been on DPT, is now off.  Baby had 7% weight loss at 57 hrs of age. Had 10 stools and 5 voids.  Patient Name: Girl Jake SharkSara Tom ZOXWR'UToday's Date: 02/28/2017 Reason for consult: Follow-up assessment   Maternal Data    Feeding Feeding Type: Breast Fed Length of feed: 20 min  LATCH Score/Interventions          Comfort (Breast/Nipple): Filling, red/small blisters or bruises, mild/mod discomfort  Problem noted: Cracked, bleeding, blisters, bruises;Mild/Moderate discomfort  Intervention(s): Support Pillows;Position options;Skin to skin;Breastfeeding basics reviewed     Lactation Tools Discussed/Used     Consult Status Consult Status: Follow-up Date: 02/28/17 Follow-up type: In-patient    Thelton Graca, Diamond NickelLAURA G 02/28/2017, 4:06 AM

## 2017-02-28 NOTE — Discharge Summary (Signed)
OB Discharge Summary  Patient Name: Kim Garcia DOB: June 17, 1986 MRN: [id number]  Date of admission: 02/25/2017  Admitting diagnosis: Previous Cesarean Section, desires sterilization Intrauterine pregnancy: 6232w4d       Date of discharge: 02/28/2017    Discharge diagnosis: Term Pregnancy Delivered , desires sterilization   Prenatal history: [id number]   EDC : 02/28/2017, by Other Basis  [facility]  Primary provider : Zaynab Chipman Prenatal course complicated by previous CS / edema/morbid obesity  Prenatal Labs: ABO, Rh: --/--/O POS (03/15 1052) Antibody: NEG (03/15 1052) Rubella: Immune (08/17 0000)  RPR: Non Reactive (03/15 1052)  HBsAg: Negative (08/17 0000)  HIV: Non-reactive (08/17 0000)  GBS: Negative (02/21 0000)                                    Hospital course:  Sceduled C/S   31 y.o. yo [id number] at 6132w4d was admitted to the hospital 02/25/2017 for scheduled cesarean section with the following indication:Elective Repeat. Desires sterilization  Membrane Rupture Time/Date: 1:42 PM ,02/25/2017   Patient delivered a Viable infant.02/25/2017  Details of operation can be found in separate operative note.  Pateint had an uncomplicated postpartum course.  She is ambulating, tolerating a regular diet, passing flatus, and urinating well. Patient is discharged home in stable condition on  02/28/17         Delivering PROVIDER: Moira Umholtz                                                            Complications: None  Newborn Data: Live born female  Birth Weight: 9 lb 9 oz (4338 g) APGAR: 8, 8  Baby Feeding: Breast Disposition:home with mother  Post partum procedures:none  Postpartum contraception: Not Discussed    Labs: Lab Results  Component Value Date   WBC 9.6 02/26/2017   HGB 11.0 (L) 02/26/2017   HCT 34.6 (L) 02/26/2017   MCV 89.4 02/26/2017   PLT 155 02/26/2017   CMP Latest Ref Rng & Units 05/28/2011  Glucose 70 - 99  mg/dL 74  BUN 6 - 23 mg/dL 4(L)  Creatinine 6.210.50 - 1.10 mg/dL <3.08(M<0.47(L)  Sodium 578135 - 145 mEq/L 135  Potassium 3.5 - 5.1 mEq/L 3.4(L)  Chloride 96 - 112 mEq/L 102  CO2 19 - 32 mEq/L 21  Calcium 8.4 - 10.5 mg/dL 9.7  Total Protein 6.0 - 8.3 g/dL 6.1  Total Bilirubin 0.3 - 1.2 mg/dL 0.4  Alkaline Phos 39 - 117 U/L 170(H)  AST 0 - 37 U/L 20  ALT 0 - 35 U/L 12    Physical Exam @ time of discharge:  Vitals:   02/26/17 1819 02/27/17 0500 02/27/17 1900 02/28/17 0554  BP: 116/70 132/77 117/86 109/60  Pulse: 100 87 88   Resp: 20 18 18 18   Temp: 98.4 F (36.9 C) 98.4 F (36.9 C) 98.2 F (36.8 C) 98.1 F (36.7 C)  TempSrc: Axillary Oral Oral Oral  SpO2: 100%  96%   Weight:      Height:        General: alert, cooperative and no distress Lochia: appropriate Uterine Fundus: firm Perineum: intact Incision: Healing well with no significant  drainage Extremities: DVT Evaluation: No evidence of DVT seen on physical exam.   Discharge instructions:  "Baby and Me Booklet" and [facility] Booklet  Discharge Medications:  Allergies as of 02/28/2017      Reactions   Tape Dermatitis   Caused skin irritation, and tore skin when removing      Medication List    TAKE these medications   albuterol 108 (90 Base) MCG/ACT inhaler Commonly known as:  PROVENTIL HFA;VENTOLIN HFA Inhale 1 puff into the lungs every 4 (four) hours as needed for wheezing or shortness of breath.   cetirizine 10 MG tablet Commonly known as:  ZYRTEC Take 10 mg by mouth daily as needed for allergies.   ferrous sulfate 325 (65 FE) MG EC tablet Take 325 mg by mouth daily with breakfast.   hydrochlorothiazide 12.5 MG capsule Commonly known as:  MICROZIDE Take 1 capsule (12.5 mg total) by mouth daily.   ibuprofen 600 MG tablet Commonly known as:  ADVIL,MOTRIN Take 1 tablet (600 mg total) by mouth every 6 (six) hours.   oxyCODONE-acetaminophen 5-325 MG tablet Commonly known as:  PERCOCET/ROXICET Take 1 tablet by  mouth every 4 (four) hours as needed (pain scale 4-7).   prenatal multivitamin Tabs tablet Take 1 tablet by mouth daily at 12 noon.       Diet: routine diet  Activity: Advance as tolerated. Pelvic rest x 6 weeks.   Follow up: 6 weeks    Signed: Marlinda Mike CNM, MSN, [facility] 02/28/2017, 1:53 PM

## 2017-02-28 NOTE — Op Note (Signed)
NAME:  Kim Garcia, Kim Garcia                       ACCOUNT NO.:  MEDICAL RECORD NO.:  [id number]  LOCATION:                                 FACILITY:  PHYSICIAN:  Maxie Better, M.D.    DATE OF BIRTH:  DATE OF PROCEDURE:  02/25/2017 DATE OF DISCHARGE:                              OPERATIVE REPORT   PREOPERATIVE DIAGNOSES:  Previous cesarean section, term gestation, desire sterilization, morbid obesity.  PROCEDURES:  Repeat cesarean section, Sharl Ma hysterotomy, modified Pomeroy tubal ligation.  POSTOPERATIVE DIAGNOSES:  Previous cesarean section, desire sterilization, term gestation, morbid obesity.  ANESTHESIA:  Spinal.  SURGEON:  Maxie Better, M.D.  ASSISTANT:  Arlan Organ, CNM  DESCRIPTION OF PROCEDURE:  Under adequate spinal anesthesia, the patient was placed in the supine position with a left lateral tilt.  Due to her large pannus, Traxit retractor was placed.  The patient was then sterilely prepped and draped in usual fashion.  Indwelling Foley catheter was sterilely placed.  A 0.25% Marcaine was injected along the previous Pfannenstiel skin incision site.  Pfannenstiel skin incision was then made, carried down to the rectus fascia.  The rectus fascia was opened transversely.  The rectus fascia was then bluntly and sharply dissected off the rectus muscle, superior and inferior fashion.  The rectus muscle was split in midline.  The parietal peritoneum was entered bluntly and extended.  Some lower uterine segment adhesions to bladder adherence were noted.  The lower uterine segment adhesions were lysed with the vesicouterine peritoneum and opened transversely and the bladder was carefully displaced inferiorly.  A curvilinear low- transverse uterine incision was then made and extended with bandage scissors.  Artificial rupture of membranes was then accomplished, large amount of clear amniotic fluid was noted.  Subsequent delivery of a live female from the right occiput  transverse position, who was bulb suctioned in the abdomen.  Cord was clamped, cut.  The baby was transferred to the awaiting pediatricians, who assigned Apgars of 8 and 8 at 1 and 5 minutes.  The placenta which was anterior was manually removed.  Uterine cavity was cleaned of debris.  Uterine incision had no extension.  It was closed in 2 layers, the first layer of 0 Monocryl running lock stitch.  Second layer was imbricated using 0 Monocryl suture, figure-of-eight 0 Monocryl suture was placed in the left angle for hemostasis.  Attention was then turned to the tubes and ovaries. The left tube had a paratubal cyst.  The left ovary was normal.  The left fallopian tube midportion was grasped with a Babcock.  The underlying mesosalpinx was opened with cautery.  Proximal-distal portion of the segment of tube to be removed, was tied with 0 chromic suture proximally, distally x2 and the intervening segment of fallopian tube was then removed.  Similar procedure was performed on the contralateral side removing a portion of the right fallopian tube.  The abdomen was irrigated and suctioned.  Interceed was placed in the lower uterine segment.  The Alexis retractor was removed.  The parietal peritoneum was then closed with 2-0 Vicryl.  The rectus fascia was then closed with 0 Vicryl x2.  The subcutaneous  area was irrigated.  Small bleeder was cauterized, interrupted 2-0 plain suture was placed and the skin approximated with Ethicon staples.  SPECIMEN:  Portion of right and left fallopian tubes sent to Pathology.  ESTIMATED BLOOD LOSS:  700 mL.  INTRAOPERATIVE FLUIDS:  2 L.  URINE OUTPUT:  150 mL.  COUNTS:  Sponge and instrument counts x2 were correct.  COMPLICATION:  None.  The patient tolerated the procedure well, was transferred to the recovery room in stable condition.  The baby was transferred to the regular nursery.     Maxie BetterSheronette Falana Clagg, M.D.     /MEDQ  D:  02/25/2017  T:   02/25/2017  Job:  [id number]

## 2017-02-28 NOTE — Progress Notes (Signed)
Patient called out c/o of a "sudden sharp pain from my tailbone up my spine", when she leaned forward in her bed.  This nurse helped her reposition; pain decreased.

## 2017-02-28 NOTE — Lactation Note (Addendum)
This note was copied from a baby's chart. Lactation Consultation Note  Patient Name: Kim Garcia WGNFA'OToday's Date: 02/28/2017 Reason for consult: Follow-up assessment;Infant weight loss (7% weight loss, )  Baby is 9168 hour old - Serum Bili - @2257  - 11.7  LC updated doc flow sheets per mom - Baby is feeding consistently - see doc flow sheets , voids and stools QS for age.  Per mom reports increased swallows. LC reassured mom that is a good sign.  Sore nipple and engorgement prevention and tx reviewed.  Mom already had coconut oil and comfort gels from previous LC.  Per mom will have a DEBP at home and plans to pick up her insurance benefit pump today After D/C.  Mother informed of post-discharge support and given phone number to the lactation department, including services for phone call assistance; out-patient appointments; and breastfeeding support group. List of other breastfeeding resources in the community given in the handout. Encouraged mother to call for problems or concerns related to breastfeeding.    Maternal Data    Feeding Feeding Type:  (per mom last fed at 0900 ) Length of feed: 20 min (per mom )  LATCH Score/Interventions                Intervention(s): Breastfeeding basics reviewed     Lactation Tools Discussed/Used WIC Program: No   Consult Status Consult Status: Complete Date: 02/28/17    Kim Garcia 02/28/2017, 10:04 AM

## 2017-03-01 ENCOUNTER — Encounter (HOSPITAL_COMMUNITY): Payer: Self-pay | Admitting: *Deleted

## 2017-04-08 DIAGNOSIS — Z130 Encounter for screening for diseases of the blood and blood-forming organs and certain disorders involving the immune mechanism: Secondary | ICD-10-CM | POA: Diagnosis not present

## 2017-06-01 ENCOUNTER — Encounter: Payer: Self-pay | Admitting: Physician Assistant

## 2017-06-01 ENCOUNTER — Ambulatory Visit: Payer: Self-pay | Admitting: Physician Assistant

## 2017-06-01 VITALS — BP 140/98 | HR 90 | Temp 98.6°F

## 2017-06-01 DIAGNOSIS — R300 Dysuria: Secondary | ICD-10-CM

## 2017-06-01 DIAGNOSIS — N390 Urinary tract infection, site not specified: Secondary | ICD-10-CM

## 2017-06-01 LAB — POCT URINALYSIS DIPSTICK
BILIRUBIN UA: NEGATIVE
Blood, UA: NEGATIVE
GLUCOSE UA: NEGATIVE
Ketones, UA: NEGATIVE
Nitrite, UA: POSITIVE
Spec Grav, UA: 1.025 (ref 1.010–1.025)
Urobilinogen, UA: 1 E.U./dL
pH, UA: 6 (ref 5.0–8.0)

## 2017-06-01 MED ORDER — CEPHALEXIN 500 MG PO CAPS: 500.0000 mg | ORAL_CAPSULE | Freq: Three times a day (TID) | ORAL | 0 refills | Status: DC

## 2017-06-01 NOTE — Progress Notes (Signed)
S:  C/o uti sx for 2 days, lower abd feels achey, some freq, no burning, no fever/chills, denies vaginal discharge, abdominal pain or flank pain:  Remainder ros neg, is breastfeeding  O:  Vitals wnl, nad, no cva tenderness, back nontender, lungs c t a,cv rrr, abd soft nontender, bs normal, n/v intact  A: uti  P: keflex 500mg  tid;  increase water intake, add cranberry juice, return if not improving in 2 -3 days, return earlier if worsening, discussed pyelonephritis sx

## 2017-07-05 ENCOUNTER — Other Ambulatory Visit
Admission: RE | Admit: 2017-07-05 | Discharge: 2017-07-05 | Disposition: A | Discharge status: Home / Self Care | Payer: 59 | Source: Ambulatory Visit | Attending: Physician Assistant | Admitting: Physician Assistant

## 2017-07-05 ENCOUNTER — Encounter: Payer: Self-pay | Admitting: Physician Assistant

## 2017-07-05 ENCOUNTER — Ambulatory Visit: Payer: Self-pay | Admitting: Physician Assistant

## 2017-07-05 VITALS — BP 128/92 | HR 96 | Temp 98.5°F

## 2017-07-05 DIAGNOSIS — N390 Urinary tract infection, site not specified: Secondary | ICD-10-CM | POA: Diagnosis not present

## 2017-07-05 DIAGNOSIS — R300 Dysuria: Secondary | ICD-10-CM

## 2017-07-05 DIAGNOSIS — R319 Hematuria, unspecified: Secondary | ICD-10-CM

## 2017-07-05 LAB — POCT URINALYSIS DIPSTICK
Bilirubin, UA: NEGATIVE
GLUCOSE UA: NEGATIVE
KETONES UA: NEGATIVE
Nitrite, UA: POSITIVE
SPEC GRAV UA: 1.025 (ref 1.010–1.025)
Urobilinogen, UA: 0.2 E.U./dL
pH, UA: 6 (ref 5.0–8.0)

## 2017-07-05 MED ORDER — CIPROFLOXACIN HCL 250 MG PO TABS: 250.0000 mg | ORAL_TABLET | Freq: Two times a day (BID) | ORAL | 0 refills | Status: DC

## 2017-07-05 NOTE — Progress Notes (Signed)
S:  C/o uti sx for 2 days, burning, urgency, frequency, denies vaginal discharge, abdominal pain or flank pain:  Remainder ros neg, pt is still breast feeding but can pump and dump, also had uti in June and didn't feel like it ever went away, hx of multiple utis  O:  Vitals wnl, nad, no cva tenderness, back nontender, lungs c t a,cv rrr, abd soft nontender, bs normal, n/v intact  A: uti  P: cipro 250mg  bid x 7d, increase water intake, add cranberry juice, return if not improving in 2 -3 days, return earlier if worsening, discussed pyelonephritis sx, urine culture ordered, pt to go to [facility] lab, pump and dump while on antibiotic and day after finishing

## 2017-07-07 LAB — URINE CULTURE: Culture: >=100000 — AB

## 2017-08-19 ENCOUNTER — Ambulatory Visit (INDEPENDENT_AMBULATORY_CARE_PROVIDER_SITE_OTHER): Payer: 59 | Admitting: Family Medicine

## 2017-08-19 ENCOUNTER — Encounter: Payer: Self-pay | Admitting: Family Medicine

## 2017-08-19 DIAGNOSIS — G43709 Chronic migraine without aura, not intractable, without status migrainosus: Secondary | ICD-10-CM

## 2017-08-19 DIAGNOSIS — G43909 Migraine, unspecified, not intractable, without status migrainosus: Secondary | ICD-10-CM | POA: Insufficient documentation

## 2017-08-19 MED ORDER — SUMATRIPTAN SUCCINATE 50 MG PO TABS: 50.0000 mg | ORAL_TABLET | ORAL | 1 refills | Status: DC | PRN

## 2017-08-19 NOTE — Assessment & Plan Note (Signed)
New problem. History consistent with migraine headache. Treating with PRN Imitrex.

## 2017-08-19 NOTE — Progress Notes (Signed)
Subjective:  Patient ID: Kim Garcia, female    DOB: 1986-06-23  Age: 31 y.o. MRN: [id number]  CC: Migraine  HPI:  31 year old female who is currently breast-feeding presents with the above complaint.  She states that since having her child 6 months ago, she has been experiencing migraine headaches. She states that the pain is located behind her eyes. Bilateral at times. Associated nausea, photophobia, phonophobia. Also experiences blurry vision. Are severe. She has taken Tylenol and Excedrin Migraine as well as Sudafed without improvement. She states that they occur 1-2 times a month. Are debilitating and interfering with her ability to provide care for her children. She denies any prior history of migraine. She has no other complaints or concerns at this time.  Social Hx   Social History   Social History  . Marital status: Married    Spouse name: N/A  . Number of children: N/A  . Years of education: N/A   Social History Main Topics  . Smoking status: Never Smoker  . Smokeless tobacco: Never Used  . Alcohol use No  . Drug use: No  . Sexual activity: Yes    Birth control/ protection: None     Comment: pregnant   Other Topics Concern  . None   Social History Narrative  . None    Review of Systems  Eyes: Positive for photophobia and visual disturbance.  Neurological: Positive for headaches.   Objective:  BP 120/86 (BP Location: Right Arm, Patient Position: Sitting, Cuff Size: Large)   Pulse 97   Temp 98.2 F (36.8 C) (Oral)   Resp 12   Wt 245 lb 4 oz (111.2 kg)   SpO2 97%   BMI 44.86 kg/m   BP/Weight 08/19/2017 07/05/2017 06/01/2017  Systolic BP 120 128 140  Diastolic BP 86 92 98  Wt. (Lbs) 245.25 - -  BMI 44.86 - -   Physical Exam  Constitutional: She is oriented to person, place, and time. She appears well-developed. No distress.  Eyes: Pupils are equal, round, and reactive to light. Conjunctivae and EOM are normal.  Cardiovascular: Normal rate and regular  rhythm.   No murmur heard. Pulmonary/Chest: Effort normal. She has no wheezes. She has no rales.  Neurological: She is alert and oriented to person, place, and time.  Psychiatric: She has a normal mood and affect.  Vitals reviewed.   Lab Results  Component Value Date   WBC 9.6 02/26/2017   HGB 11.0 (L) 02/26/2017   HCT 34.6 (L) 02/26/2017   PLT 155 02/26/2017   GLUCOSE 74 05/28/2011   ALT 12 05/28/2011   AST 20 05/28/2011   NA 135 05/28/2011   K 3.4 (L) 05/28/2011   CL 102 05/28/2011   CREATININE <0.47 (L) 05/28/2011   BUN 4 (L) 05/28/2011   CO2 21 05/28/2011    Assessment & Plan:   Problem List Items Addressed This Visit    Migraine    New problem. History consistent with migraine headache. Treating with PRN Imitrex.      Relevant Medications   SUMAtriptan (IMITREX) 50 MG tablet     Meds ordered this encounter  Medications  . SUMAtriptan (IMITREX) 50 MG tablet    Sig: Take 1 tablet (50 mg total) by mouth every 2 (two) hours as needed for migraine. May repeat in 2 hours if headache persists or recurs.    Dispense:  10 tablet    Refill:  1   Follow-up: PRN  Everlene OtherJayce Sarajean Dessert DO [facility]  [employer]

## 2017-08-19 NOTE — Patient Instructions (Signed)
Imitrex as directed.  Hold breastfeeding for 12 hours.  Take care  Dr. Adriana Simasook

## 2017-09-05 ENCOUNTER — Encounter: Payer: Self-pay | Admitting: Physician Assistant

## 2017-09-05 ENCOUNTER — Ambulatory Visit: Payer: Self-pay | Admitting: Physician Assistant

## 2017-09-05 VITALS — BP 138/90 | HR 96 | Temp 98.3°F

## 2017-09-05 DIAGNOSIS — H6000 Abscess of external ear, unspecified ear: Secondary | ICD-10-CM

## 2017-09-05 NOTE — Progress Notes (Signed)
S: c/o r ear pain and redness, states has been hurting for 3 days, has ear drops from ENT but they aren't helping, no fever/chills or drainage, no cp/sob, still breastfeeding  O: vitals wnl, nad, r ear canal has furuncle with whitehead, no drainage, no vesicles or ulcers, left ear canal is wnl, nasal mucosa wnl, throat wnl neck supple no lymph, lungs  t a, cv rrr  A: furuncle  P: warm compress, if worsening call clinic and will call in oral antibiotic

## 2017-11-01 ENCOUNTER — Ambulatory Visit: Payer: Self-pay | Admitting: Physician Assistant

## 2017-11-01 ENCOUNTER — Encounter: Payer: Self-pay | Admitting: Physician Assistant

## 2017-11-01 VITALS — BP 130/82 | HR 86 | Temp 98.4°F

## 2017-11-01 DIAGNOSIS — R1030 Lower abdominal pain, unspecified: Secondary | ICD-10-CM

## 2017-11-01 DIAGNOSIS — R300 Dysuria: Secondary | ICD-10-CM

## 2017-11-01 LAB — POCT URINALYSIS DIPSTICK
BILIRUBIN UA: NEGATIVE
GLUCOSE UA: NEGATIVE
KETONES UA: NEGATIVE
Leukocytes, UA: NEGATIVE
Nitrite, UA: NEGATIVE
RBC UA: NEGATIVE
SPEC GRAV UA: 1.02 (ref 1.010–1.025)
UROBILINOGEN UA: 0.2 U/dL
pH, UA: 6 (ref 5.0–8.0)

## 2017-11-01 LAB — POCT URINE PREGNANCY: PREG TEST UR: NEGATIVE

## 2017-11-01 NOTE — Progress Notes (Signed)
S: Complains of lower abdominal pain more when she bends over or stoops, is questioning whether she has a UTI, denies fever or chills, no known injury  O: Vitals are normal, patient is in no distress neuro appears intact UA is normal urine pregnant is negative  A: lower abdominal pain,   P: Reassurance, appears to be musculoskeletal, return if worsening

## 2018-01-03 ENCOUNTER — Telehealth: Payer: 59 | Admitting: Family

## 2018-01-03 DIAGNOSIS — N390 Urinary tract infection, site not specified: Secondary | ICD-10-CM

## 2018-01-03 DIAGNOSIS — A499 Bacterial infection, unspecified: Secondary | ICD-10-CM | POA: Diagnosis not present

## 2018-01-03 MED ORDER — CEPHALEXIN 500 MG PO CAPS: 500.0000 mg | ORAL_CAPSULE | Freq: Two times a day (BID) | ORAL | 0 refills | Status: DC

## 2018-01-03 NOTE — Progress Notes (Signed)

## 2018-01-16 ENCOUNTER — Telehealth: Payer: No Typology Code available for payment source | Admitting: Family

## 2018-01-16 DIAGNOSIS — B9689 Other specified bacterial agents as the cause of diseases classified elsewhere: Secondary | ICD-10-CM | POA: Diagnosis not present

## 2018-01-16 DIAGNOSIS — J329 Chronic sinusitis, unspecified: Secondary | ICD-10-CM | POA: Diagnosis not present

## 2018-01-16 MED ORDER — PREDNISONE 5 MG PO TABS: 5.0000 mg | ORAL_TABLET | ORAL | 0 refills | Status: DC

## 2018-01-16 MED ORDER — AMOXICILLIN-POT CLAVULANATE 875-125 MG PO TABS: 1.0000 | ORAL_TABLET | Freq: Two times a day (BID) | ORAL | 0 refills | Status: AC

## 2018-01-16 NOTE — Progress Notes (Signed)
Thank you for the details you included in the comment boxes. Those details are very helpful in determining the best course of treatment for you and help us to provide the best care. A prednisone pack will also help with ear pain and drainage. I have prescribed this also.   We are sorry that you are not feeling well.  Here is how we plan to help!  Based on what you have shared with me it looks like you have sinusitis.  Sinusitis is inflammation and infection in the sinus cavities of the head.  Based on your presentation I believe you most likely have Acute Bacterial Sinusitis.  This is an infection caused by bacteria and is treated with antibiotics. I have prescribed Augmentin 875mg /125mg  one tablet twice daily with food, for 7 days. You may use an oral decongestant such as Mucinex D or if you have glaucoma or high blood pressure use plain Mucinex. Saline nasal spray help and can safely be used as often as needed for congestion.  If you develop worsening sinus pain, fever or notice severe headache and vision changes, or if symptoms are not better after completion of antibiotic, please schedule an appointment with a health care provider.    Sinus infections are not as easily transmitted as other respiratory infection, however we still recommend that you avoid close contact with loved ones, especially the very young and elderly.  Remember to wash your hands thoroughly throughout the day as this is the number one way to prevent the spread of infection!  Home Care:  Only take medications as instructed by your medical team.  Complete the entire course of an antibiotic.  Do not take these medications with alcohol.  A steam or ultrasonic humidifier can help congestion.  You can place a towel over your head and breathe in the steam from hot water coming from a faucet.  Avoid close contacts especially the very young and the elderly.  Cover your mouth when you cough or sneeze.  Always remember to wash  your hands.  Get Help Right Away If:  You develop worsening fever or sinus pain.  You develop a severe head ache or visual changes.  Your symptoms persist after you have completed your treatment plan.  Make sure you  Understand these instructions.  Will watch your condition.  Will get help right away if you are not doing well or get worse.  Your e-visit answers were reviewed by a board certified advanced clinical practitioner to complete your personal care plan.  Depending on the condition, your plan could have included both over the counter or prescription medications.  If there is a problem please reply  once you have received a response from your provider.  Your safety is important to us.  If you have drug allergies check your prescription carefully.    You can use MyChart to ask questions about today's visit, request a non-urgent call back, or ask for a work or school excuse for 24 hours related to this e-Visit. If it has been greater than 24 hours you will need to follow up with your provider, or enter a new e-Visit to address those concerns.  You will get an e-mail in the next two days asking about your experience.  I hope that your e-visit has been valuable and will speed your recovery. Thank you for using e-visits.

## 2018-01-26 ENCOUNTER — Ambulatory Visit (INDEPENDENT_AMBULATORY_CARE_PROVIDER_SITE_OTHER): Payer: Self-pay | Admitting: Nurse Practitioner

## 2018-01-26 VITALS — BP 136/88 | HR 67 | Temp 97.8°F | Wt 245.0 lb

## 2018-01-26 DIAGNOSIS — R030 Elevated blood-pressure reading, without diagnosis of hypertension: Secondary | ICD-10-CM

## 2018-01-26 NOTE — Progress Notes (Signed)
Subjective:   Kim Garcia is a 32 y.o. female who presents for evaluation of elevated blood pressures.  Patient was seen at an employee wellcheck screening on 2/12 and was told her bp was 176 systolically, did not know bottom number.  This morning patient states she was at home and felt palpations and nausea, stating "I just didn't feel right".  Patient further states she never had any problems with HTN, even when she was pregnant. . Patient denies: chest pain, dyspnea, fatigue, lower extremity edema, syncope and tachypnea. Cardiovascular risk factors: obesity (BMI >= 30 kg/m2).   The following portions of the patient's history were reviewed and updated as appropriate: allergies, current medications and past medical history.  Review of Systems Constitutional: negative Eyes: negative Ears, nose, mouth, throat, and face: negative Respiratory: negative Cardiovascular: positive for palpitations, negative for chest pain, chest pressure/discomfort, exertional chest pressure/discomfort, irregular heart beat and lower extremity edema Gastrointestinal: positive for nausea, negative for abdominal pain, constipation and vomiting Neurological: positive for headaches   Objective:    BP 136/88   Pulse 67   Temp 97.8 F (36.6 C)   Wt 245 lb (111.1 kg)   SpO2 98%   BMI 44.81 kg/m  General appearance: alert, cooperative and no distress Head: Normocephalic, without obvious abnormality, atraumatic Eyes: conjunctivae/corneas clear. PERRL, EOM's intact. Fundi benign. Ears: normal TM's and external ear canals both ears Nose: Nares normal. Septum midline. Mucosa normal. No drainage or sinus tenderness. Throat: lips, mucosa, and tongue normal; teeth and gums normal Lungs: clear to auscultation bilaterally Heart: regular rate and rhythm, S1, S2 normal, no murmur, click, rub or gallop, no click and no rub Abdomen: soft, non-tender; bowel sounds normal; no masses,  no organomegaly Pulses: 2+ and  symmetric Skin: Skin color, texture, turgor normal. No rashes or lesions Lymph nodes: cervical and submandibular nodes normal Neurologic: Grossly normal   Assessment:   Hypertension Prevention  Plan:    Dietary sodium restriction. Check blood pressures 2 times daily and record. Patient is scheduled to go see PCP later this month for a CPE.  Patient instructed to keep BP log so PCP will be able to identify trends.  Patient verbalizes understanding.  Patient education provided for hypertension and prevention of hypertension.

## 2018-01-26 NOTE — Patient Instructions (Addendum)
Preventing Hypertension Hypertension, commonly called high blood pressure, is when the force of blood pumping through the arteries is too strong. Arteries are blood vessels that carry blood from the heart throughout the body. Over time, hypertension can damage the arteries and decrease blood flow to important parts of the body, including the brain, heart, and kidneys. Often, hypertension does not cause symptoms until blood pressure is very high. For this reason, it is important to have your blood pressure checked on a regular basis. Hypertension can often be prevented with diet and lifestyle changes. If you already have hypertension, you can control it with diet and lifestyle changes, as well as medicine. What nutrition changes can be made? Maintain a healthy diet. This includes:  Eating less salt (sodium). Ask your health care provider how much sodium is safe for you to have. The general recommendation is to consume less than 1 tsp (2,300 mg) of sodium a day. ? Do not add salt to your food. ? Choose low-sodium options when grocery shopping and eating out.  Limiting fats in your diet. You can do this by eating low-fat or fat-free dairy products and by eating less red meat.  Eating more fruits, vegetables, and whole grains. Make a goal to eat: ? 1-2 cups of fresh fruits and vegetables each day. ? 3-4 servings of whole grains each day.  Avoiding foods and beverages that have added sugars.  Eating fish that contain healthy fats (omega-3 fatty acids), such as mackerel or salmon.  If you need help putting together a healthy eating plan, try the DASH diet. This diet is high in fruits, vegetables, and whole grains. It is low in sodium, red meat, and added sugars. DASH stands for Dietary Approaches to Stop Hypertension. What lifestyle changes can be made?  Lose weight if you are overweight. Losing just 3?5% of your body weight can help prevent or control hypertension. ? For example, if your present  weight is 200 lb (91 kg), a loss of 3-5% of your weight means losing 6-10 lb (2.7-4.5 kg). ? Ask your health care provider to help you with a diet and exercise plan to safely lose weight.  Get enough exercise. Do at least 150 minutes of moderate-intensity exercise each week. ? You could do this in short exercise sessions several times a day, or you could do longer exercise sessions a few times a week. For example, you could take a brisk 10-minute walk or bike ride, 3 times a day, for 5 days a week.  Find ways to reduce stress, such as exercising, meditating, listening to music, or taking a yoga class. If you need help reducing stress, ask your health care provider.  Do not smoke. This includes e-cigarettes. Chemicals in tobacco and nicotine products raise your blood pressure each time you smoke. If you need help quitting, ask your health care provider.  Avoid alcohol. If you drink alcohol, limit alcohol intake to no more than 1 drink a day for nonpregnant women and 2 drinks a day for men. One drink equals 12 oz of beer, 5 oz of wine, or 1 oz of hard liquor. Why are these changes important? Diet and lifestyle changes can help you prevent hypertension, and they may make you feel better overall and improve your quality of life. If you have hypertension, making these changes will help you control it and help prevent major complications, such as:  Hardening and narrowing of arteries that supply blood to: ? Your heart. This can cause a heart  attack. ? Your brain. This can cause a stroke. ? Your kidneys. This can cause kidney failure.  Stress on your heart muscle, which can cause heart failure.  What can I do to lower my risk?  Work with your health care provider to make a hypertension prevention plan that works for you. Follow your plan and keep all follow-up visits as told by your health care provider.  Learn how to check your blood pressure at home. Make sure that you know your personal target  blood pressure, as told by your health care provider. How is this treated? In addition to diet and lifestyle changes, your health care provider may recommend medicines to help lower your blood pressure. You may need to try a few different medicines to find what works best for you. You also may need to take more than one medicine. Take over-the-counter and prescription medicines only as told by your health care provider. Where to find support: Your health care provider can help you prevent hypertension and help you keep your blood pressure at a healthy level. Your local hospital or your community may also provide support services and prevention programs. The American Heart Association offers an online support network at: https://www.lee.net/ Where to find more information: Learn more about hypertension from:  National Heart, Lung, and Blood Institute: https://www.peterson.org/  Centers for Disease Control and Prevention: AboutHD.co.nz  American Academy of Family Physicians: http://familydoctor.org/familydoctor/en/diseases-conditions/high-blood-pressure.printerview.all.html  Learn more about the DASH diet from:  National Heart, Lung, and Blood Institute: WedMap.it  Contact a health care provider if:  You think you are having a reaction to medicines you have taken.  You have recurrent headaches or feel dizzy.  You have swelling in your ankles.  You have trouble with your vision. Summary  Hypertension often does not cause any symptoms until blood pressure is very high. It is important to get your blood pressure checked regularly.  Diet and lifestyle changes are the most important steps in preventing hypertension.  By keeping your blood pressure in a healthy range, you can prevent complications like heart attack, heart failure, stroke, and kidney failure.  Work with your health  care provider to make a hypertension prevention plan that works for you. This information is not intended to replace advice given to you by your health care provider. Make sure you discuss any questions you have with your health care provider. Document Released: 12/14/2015 Document Revised: 08/09/2016 Document Reviewed: 08/09/2016 Elsevier Interactive Patient Education  2018 [employer].  Hypertension Hypertension, commonly called high blood pressure, is when the force of blood pumping through the arteries is too strong. The arteries are the blood vessels that carry blood from the heart throughout the body. Hypertension forces the heart to work harder to pump blood and may cause arteries to become narrow or stiff. Having untreated or uncontrolled hypertension can cause heart attacks, strokes, kidney disease, and other problems. A blood pressure reading consists of a higher number over a lower number. Ideally, your blood pressure should be below 120/80. The first ("top") number is called the systolic pressure. It is a measure of the pressure in your arteries as your heart beats. The second ("bottom") number is called the diastolic pressure. It is a measure of the pressure in your arteries as the heart relaxes. What are the causes? The cause of this condition is not known. What increases the risk? Some risk factors for high blood pressure are under your control. Others are not. Factors you can change  Smoking.  Having  type 2 diabetes mellitus, high cholesterol, or both.  Not getting enough exercise or physical activity.  Being overweight.  Having too much fat, sugar, calories, or salt (sodium) in your diet.  Drinking too much alcohol. Factors that are difficult or impossible to change  Having chronic kidney disease.  Having a family history of high blood pressure.  Age. Risk increases with age.  Race. You may be at higher risk if you are African-American.  Gender. Men are at higher  risk than women before age 72. After age 15, women are at higher risk than men.  Having obstructive sleep apnea.  Stress. What are the signs or symptoms? Extremely high blood pressure (hypertensive crisis) may cause:  Headache.  Anxiety.  Shortness of breath.  Nosebleed.  Nausea and vomiting.  Severe chest pain.  Jerky movements you cannot control (seizures).  How is this diagnosed? This condition is diagnosed by measuring your blood pressure while you are seated, with your arm resting on a surface. The cuff of the blood pressure monitor will be placed directly against the skin of your upper arm at the level of your heart. It should be measured at least twice using the same arm. Certain conditions can cause a difference in blood pressure between your right and left arms. Certain factors can cause blood pressure readings to be lower or higher than normal (elevated) for a short period of time:  When your blood pressure is higher when you are in a health care provider's office than when you are at home, this is called white coat hypertension. Most people with this condition do not need medicines.  When your blood pressure is higher at home than when you are in a health care provider's office, this is called masked hypertension. Most people with this condition may need medicines to control blood pressure.  If you have a high blood pressure reading during one visit or you have normal blood pressure with other risk factors:  You may be asked to return on a different day to have your blood pressure checked again.  You may be asked to monitor your blood pressure at home for 1 week or longer.  If you are diagnosed with hypertension, you may have other blood or imaging tests to help your health care provider understand your overall risk for other conditions. How is this treated? This condition is treated by making healthy lifestyle changes, such as eating healthy foods, exercising more,  and reducing your alcohol intake. Your health care provider may prescribe medicine if lifestyle changes are not enough to get your blood pressure under control, and if:  Your systolic blood pressure is above 130.  Your diastolic blood pressure is above 80.  Your personal target blood pressure may vary depending on your medical conditions, your age, and other factors. Follow these instructions at home: Eating and drinking  Eat a diet that is high in fiber and potassium, and low in sodium, added sugar, and fat. An example eating plan is called the DASH (Dietary Approaches to Stop Hypertension) diet. To eat this way: ? Eat plenty of fresh fruits and vegetables. Try to fill half of your plate at each meal with fruits and vegetables. ? Eat whole grains, such as whole wheat pasta, brown rice, or whole grain bread. Fill about one quarter of your plate with whole grains. ? Eat or drink low-fat dairy products, such as skim milk or low-fat yogurt. ? Avoid fatty cuts of meat, processed or cured meats, and poultry  with skin. Fill about one quarter of your plate with lean proteins, such as fish, chicken without skin, beans, eggs, and tofu. ? Avoid premade and processed foods. These tend to be higher in sodium, added sugar, and fat.  Reduce your daily sodium intake. Most people with hypertension should eat less than 1,500 mg of sodium a day.  Limit alcohol intake to no more than 1 drink a day for nonpregnant women and 2 drinks a day for men. One drink equals 12 oz of beer, 5 oz of wine, or 1 oz of hard liquor. Lifestyle  Work with your health care provider to maintain a healthy body weight or to lose weight. Ask what an ideal weight is for you.  Get at least 30 minutes of exercise that causes your heart to beat faster (aerobic exercise) most days of the week. Activities may include walking, swimming, or biking.  Include exercise to strengthen your muscles (resistance exercise), such as pilates or  lifting weights, as part of your weekly exercise routine. Try to do these types of exercises for 30 minutes at least 3 days a week.  Do not use any products that contain nicotine or tobacco, such as cigarettes and e-cigarettes. If you need help quitting, ask your health care provider.  Monitor your blood pressure at home as told by your health care provider.  Keep all follow-up visits as told by your health care provider. This is important. Medicines  Take over-the-counter and prescription medicines only as told by your health care provider. Follow directions carefully. Blood pressure medicines must be taken as prescribed.  Do not skip doses of blood pressure medicine. Doing this puts you at risk for problems and can make the medicine less effective.  Ask your health care provider about side effects or reactions to medicines that you should watch for. Contact a health care provider if:  You think you are having a reaction to a medicine you are taking.  You have headaches that keep coming back (recurring).  You feel dizzy.  You have swelling in your ankles.  You have trouble with your vision. Get help right away if:  You develop a severe headache or confusion.  You have unusual weakness or numbness.  You feel faint.  You have severe pain in your chest or abdomen.  You vomit repeatedly.  You have trouble breathing. Summary  Hypertension is when the force of blood pumping through your arteries is too strong. If this condition is not controlled, it may put you at risk for serious complications.  Your personal target blood pressure may vary depending on your medical conditions, your age, and other factors. For most people, a normal blood pressure is less than 120/80.  Hypertension is treated with lifestyle changes, medicines, or a combination of both. Lifestyle changes include weight loss, eating a healthy, low-sodium diet, exercising more, and limiting alcohol. This  information is not intended to replace advice given to you by your health care provider. Make sure you discuss any questions you have with your health care provider. Document Released: 11/29/2005 Document Revised: 10/27/2016 Document Reviewed: 10/27/2016 Elsevier Interactive Patient Education  [employer]2018 Elsevier Inc.

## 2018-02-02 ENCOUNTER — Ambulatory Visit (INDEPENDENT_AMBULATORY_CARE_PROVIDER_SITE_OTHER): Payer: No Typology Code available for payment source | Admitting: Family Medicine

## 2018-02-02 ENCOUNTER — Other Ambulatory Visit: Payer: Self-pay

## 2018-02-02 ENCOUNTER — Encounter: Payer: Self-pay | Admitting: Family Medicine

## 2018-02-02 VITALS — BP 132/86 | HR 98 | Temp 97.7°F | Ht 62.0 in | Wt 252.2 lb

## 2018-02-02 DIAGNOSIS — Z0001 Encounter for general adult medical examination with abnormal findings: Secondary | ICD-10-CM | POA: Insufficient documentation

## 2018-02-02 DIAGNOSIS — G4710 Hypersomnia, unspecified: Secondary | ICD-10-CM | POA: Insufficient documentation

## 2018-02-02 DIAGNOSIS — Z13220 Encounter for screening for lipoid disorders: Secondary | ICD-10-CM | POA: Diagnosis not present

## 2018-02-02 DIAGNOSIS — R030 Elevated blood-pressure reading, without diagnosis of hypertension: Secondary | ICD-10-CM | POA: Diagnosis not present

## 2018-02-02 DIAGNOSIS — Z0000 Encounter for general adult medical examination without abnormal findings: Secondary | ICD-10-CM | POA: Insufficient documentation

## 2018-02-02 DIAGNOSIS — R5383 Other fatigue: Secondary | ICD-10-CM

## 2018-02-02 DIAGNOSIS — Z6841 Body Mass Index (BMI) 40.0 and over, adult: Secondary | ICD-10-CM | POA: Insufficient documentation

## 2018-02-02 NOTE — Assessment & Plan Note (Signed)
Elevated on one occasion with symptoms of headache and slight dizziness.  Has not had any recurrence of this.  Question whether or not this is related to stress given that she notes quite a bit of stress with her job.  We will have her start checking blood pressures at home.  If it starts to go up again she will let us know.  I did discuss that the possible sleep apnea could contribute to blood pressure issues.

## 2018-02-02 NOTE — Patient Instructions (Signed)
Nice to see you. We will request your Pap smear results. Please work on diet and exercise to help lose weight and stay healthy.  I have included some dietary instructions below. Please start monitoring your blood pressure periodically.  If it goes back up please let us know. We will get you set up for a sleep study to evaluate for sleep apnea.  If you do not hear about this in the next week please let us know.  Diet Recommendations  Starchy (carb) foods: Bread, rice, pasta, potatoes, corn, cereal, grits, crackers, bagels, muffins, all baked goods.  (Fruits, milk, and yogurt also have carbohydrate, but most of these foods will not spike your blood sugar as the starchy foods will.)  A few fruits do cause high blood sugars; use small portions of bananas (limit to 1/2 at a time), grapes, watermelon, oranges, and most tropical fruits.    Protein foods: Meat, fish, poultry, eggs, dairy foods, and beans such as pinto and kidney beans (beans also provide carbohydrate).   1. Eat at least 3 meals and 1-2 snacks per day. Never go more than 4-5 hours while awake without eating. Eat breakfast within the first hour of getting up.   2. Limit starchy foods to TWO per meal and ONE per snack. ONE portion of a starchy  food is equal to the following:   - ONE slice of bread (or its equivalent, such as half of a hamburger bun).   - 1/2 cup of a "scoopable" starchy food such as potatoes or rice.   - 15 grams of carbohydrate as shown on food label.  3. Include at every meal: a protein food, a carb food, and vegetables and/or fruit.   - Obtain twice the volume of veg's as protein or carbohydrate foods for both lunch and dinner.   - Fresh or frozen veg's are best.   - Keep frozen veg's on hand for a quick vegetable serving.

## 2018-02-02 NOTE — Assessment & Plan Note (Signed)
Concerning for sleep apnea.  We will get a sleep study.  Epworth Sleepiness Scale score of 15.

## 2018-02-02 NOTE — Assessment & Plan Note (Signed)
Physical exam completed.  Encouraged diet and exercise.  We will request records from her gynecologist. She will return for fasting lab work.

## 2018-02-02 NOTE — Progress Notes (Signed)
Tommi Rumps, MD Phone: [phone]  Kim Garcia is a 32 y.o. female who presents today for physical exam.  Not exercising very much. She has cut her portion sizes down.  She has lost about 30 pounds with doing this. Pap smear about a year ago with gynecology.  We will request records. No family history of breast cancer, ovarian cancer, or cervical cancer.  She has a maternal grandmother with colon cancer. She believes her tetanus vaccination is up-to-date. She received her flu shot. HIV testing up-to-date. No tobacco use, alcohol use, or illicit drug use.  She reports she went to a health fair and her blood pressure was 170/90 on one occasion.  She felt like she had a little bit of a headache and felt a little dizzy with this.  She ended up seeing employee health and her blood pressure was normal.  Her blood pressure has not gone back up.  She has not had any recurrence of those symptoms.  She reports some sleep difficulty and not waking up well rested.  She does feel like she cannot get enough sleep.  She does snore.  She does report observed apneic episodes.  She is up multiple times at night to care for her children.  Active Ambulatory Problems    Diagnosis Date Noted  . Anxiety and depression 04/01/2016  . Plantar fasciitis 04/01/2016  . Migraine 08/19/2017  . Encounter for general adult medical examination with abnormal findings 02/02/2018  . Hypersomnia 02/02/2018  . Morbid obesity ([place]) 02/02/2018  . Elevated blood pressure reading 02/02/2018   Resolved Ambulatory Problems    Diagnosis Date Noted  . Postpartum care following cesarean delivery (10/16) Indication: repeat and BTL 09/27/2014  . Fever, unspecified 04/01/2016   Past Medical History:  Diagnosis Date  . Anemia   . Asthma   . Depression   . History of blood transfusion 05/2011  . Missed ab 2014  . Seasonal allergies   . SVD (spontaneous vaginal delivery) 05/2011    Family History  Problem Relation Age  of Onset  . Alcoholism Unknown        Parent  . Colon cancer Maternal Grandmother   . Diabetes Maternal Grandmother   . Stroke Paternal Grandmother   . Diabetes Paternal Grandmother     Social History   Socioeconomic History  . Marital status: Married    Spouse name: Not on file  . Number of children: Not on file  . Years of education: Not on file  . Highest education level: Not on file  Social Needs  . Financial resource strain: Not on file  . Food insecurity - worry: Not on file  . Food insecurity - inability: Not on file  . Transportation needs - medical: Not on file  . Transportation needs - non-medical: Not on file  Occupational History  . Not on file  Tobacco Use  . Smoking status: Never Smoker  . Smokeless tobacco: Never Used  Substance and Sexual Activity  . Alcohol use: No  . Drug use: No  . Sexual activity: Yes    Birth control/protection: None    Comment: pregnant  Other Topics Concern  . Not on file  Social History Narrative  . Not on file    ROS  General:  Negative for nexplained weight loss, fever Skin: Negative for new or changing mole, sore that won't heal HEENT: Negative for trouble hearing, trouble seeing, ringing in ears, mouth sores, hoarseness, change in voice, dysphagia. CV:  Negative for  chest pain, dyspnea, edema, palpitations Resp: Negative for cough, dyspnea, hemoptysis GI: Negative for nausea, vomiting, diarrhea, constipation, abdominal pain, melena, hematochezia. GU: Negative for dysuria, incontinence, urinary hesitance, hematuria, vaginal or penile discharge, polyuria, sexual difficulty, lumps in testicle or breasts MSK: Negative for muscle cramps or aches, joint pain or swelling Neuro: Negative for headaches, weakness, numbness, dizziness, passing out/fainting Psych: Negative for depression, anxiety, memory problems  Objective  Physical Exam Vitals:   02/02/18 0945  BP: 132/86  Pulse: 98  Temp: 97.7 F (36.5 C)  SpO2: 98%     BP Readings from Last 3 Encounters:  02/02/18 132/86  01/26/18 136/88  11/01/17 130/82   Wt Readings from Last 3 Encounters:  02/02/18 252 lb 3.2 oz (114.4 kg)  01/26/18 245 lb (111.1 kg)  08/19/17 245 lb 4 oz (111.2 kg)    Physical Exam  Constitutional: No distress.  HENT:  Head: Normocephalic and atraumatic.  Mouth/Throat: Oropharynx is clear and moist. No oropharyngeal exudate.  Eyes: Conjunctivae are normal. Pupils are equal, round, and reactive to light.  Neck: Neck supple.  Cardiovascular: Normal rate, regular rhythm and normal heart sounds.  Pulmonary/Chest: Effort normal and breath sounds normal.  Abdominal: Soft. Bowel sounds are normal. She exhibits no distension. There is no tenderness. There is no rebound and no guarding.  Musculoskeletal: She exhibits no edema.  Lymphadenopathy:    She has no cervical adenopathy.  Neurological: She is alert. Gait normal.  Skin: Skin is warm and dry. She is not diaphoretic.  Psychiatric: Mood and affect normal.     Assessment/Plan:   Encounter for general adult medical examination with abnormal findings Physical exam completed.  Encouraged diet and exercise.  We will request records from her gynecologist. She will return for fasting lab work.  Hypersomnia Concerning for sleep apnea.  We will get a sleep study.  Epworth Sleepiness Scale score of 15.  Morbid obesity ([place]) Discussed diet and exercise.  Given dietary guidelines.  She will work on exercise.  Elevated blood pressure reading Elevated on one occasion with symptoms of headache and slight dizziness.  Has not had any recurrence of this.  Question whether or not this is related to stress given that she notes quite a bit of stress with her job.  We will have her start checking blood pressures at home.  If it starts to go up again she will let us know.  I did discuss that the possible sleep apnea could contribute to blood pressure issues.   Orders Placed This  Encounter  Procedures  . Lipid panel    Standing Status:   Future    Standing Expiration Date:   02/02/2019  . Comp Met (CMET)    Standing Status:   Future    Standing Expiration Date:   02/02/2019  . Hemoglobin A1c    Standing Status:   Future    Standing Expiration Date:   02/02/2019  . TSH    Standing Status:   Future    Standing Expiration Date:   02/02/2019  . CBC w/Diff    Standing Status:   Future    Standing Expiration Date:   02/02/2019  . Split night study    Standing Status:   Future    Standing Expiration Date:   02/02/2019    Order Specific Question:   Where should this test be performed:    Answer:   [place]    No orders of the defined types were placed in this encounter.  Tommi Rumps, MD [place]

## 2018-02-02 NOTE — Assessment & Plan Note (Signed)
Discussed diet and exercise.  Given dietary guidelines.  She will work on exercise.

## 2018-02-07 ENCOUNTER — Encounter: Payer: Self-pay | Admitting: Internal Medicine

## 2018-02-07 ENCOUNTER — Ambulatory Visit (INDEPENDENT_AMBULATORY_CARE_PROVIDER_SITE_OTHER): Payer: No Typology Code available for payment source | Admitting: Internal Medicine

## 2018-02-07 VITALS — BP 134/92 | HR 86 | Temp 99.5°F | Wt 248.0 lb

## 2018-02-07 DIAGNOSIS — R509 Fever, unspecified: Secondary | ICD-10-CM | POA: Diagnosis not present

## 2018-02-07 DIAGNOSIS — J029 Acute pharyngitis, unspecified: Secondary | ICD-10-CM | POA: Diagnosis not present

## 2018-02-07 DIAGNOSIS — J020 Streptococcal pharyngitis: Secondary | ICD-10-CM

## 2018-02-07 LAB — POC INFLUENZA A&B (BINAX/QUICKVUE)
INFLUENZA B, POC: NEGATIVE
Influenza A, POC: NEGATIVE

## 2018-02-07 LAB — POCT RAPID STREP A (OFFICE): RAPID STREP A SCREEN: POSITIVE — AB

## 2018-02-07 MED ORDER — AMOXICILLIN 875 MG PO TABS: 875.0000 mg | ORAL_TABLET | Freq: Two times a day (BID) | ORAL | 0 refills | Status: DC

## 2018-02-07 MED ORDER — METHYLPREDNISOLONE ACETATE 80 MG/ML IJ SUSP
80.0000 mg | Freq: Once | INTRAMUSCULAR | Status: AC
  Administered 2018-02-07: 80 mg via INTRAMUSCULAR

## 2018-02-07 NOTE — Progress Notes (Signed)
HPI  Pt presents to the clinic today with c/o nasal congestion, facial pain and pressure, and sore throat. This started 2 days ago. She is blowing blood tinged yellow/green mucous out of her nose. She is having difficulty swallowing. She denies ear pain, cough or chest congestion. She has run a fever up to 103, had chills and body aches. She has tried Mucinex, Ibuprfeln and Zyrtec with minimal relief. She has a history of allergies and asthma. She has had sick contacts.  Review of Systems     Past Medical History:  Diagnosis Date  . Anemia   . Asthma    as child, rarely uses inhaler  . Depression   . History of blood transfusion 05/2011   1 unit transfused at [facility]  . Missed ab 2014   no surgery required  . Seasonal allergies   . SVD (spontaneous vaginal delivery) 05/2011   x 1    Family History  Problem Relation Age of Onset  . Alcoholism Unknown        Parent  . Colon cancer Maternal Grandmother   . Diabetes Maternal Grandmother   . Stroke Paternal Grandmother   . Diabetes Paternal Grandmother     Social History   Socioeconomic History  . Marital status: Married    Spouse name: Not on file  . Number of children: Not on file  . Years of education: Not on file  . Highest education level: Not on file  Social Needs  . Financial resource strain: Not on file  . Food insecurity - worry: Not on file  . Food insecurity - inability: Not on file  . Transportation needs - medical: Not on file  . Transportation needs - non-medical: Not on file  Occupational History  . Not on file  Tobacco Use  . Smoking status: Never Smoker  . Smokeless tobacco: Never Used  Substance and Sexual Activity  . Alcohol use: No  . Drug use: No  . Sexual activity: Yes    Birth control/protection: None    Comment: pregnant  Other Topics Concern  . Not on file  Social History Narrative  . Not on file    Allergies  Allergen Reactions  . Tape Dermatitis    Caused skin irritation, and tore skin  when removing     Constitutional: Positive fatigue and fever. Denies headache, abrupt weight changes.  HEENT:  Positive facial pain, nasal congestion and sore throat. Denies eye redness, ear pain, ringing in the ears, wax buildup, runny nose or bloody nose. Respiratory:  Denies cough, difficulty breathing or shortness of breath.  Cardiovascular: Denies chest pain, chest tightness, palpitations or swelling in the hands or feet.   No other specific complaints in a complete review of systems (except as listed in HPI above).  Objective:   BP (!) 134/92   Pulse 86   Temp 99.5 F (37.5 C) (Oral)   Wt 248 lb (112.5 kg)   LMP 01/19/2018 (Approximate)   SpO2 99%   Breastfeeding? Yes   BMI 45.36 kg/m    General: Appears herstated age, ill appearing in NAD. HEENT: Head: normal shape and size, no sinus tenderness noted;  Ears: Tm's gray and intact, normal light reflex; Nose: mucosa boggy and moist, septum midline; Throat/Mouth: + PND. Teeth present, mucosa erythematous and moist, tonsils 2+, white exudate noted, no lesions or ulcerations noted.  Neck:  Bilateral anterior cervical adenopathy noted.  Cardiovascular: Normal rate and rhythm. S1,S2 noted.  No murmur, rubs or gallops noted.  Pulmonary/Chest: Normal effort and positive vesicular breath sounds. No respiratory distress. No wheezes, rales or ronchi noted.       Assessment & Plan:   Strep Throat:  RST: positive Rapid Flu: negative Ibuprofen for fever and sore throat Salt water gargles may be helpful 80 mg Depo IM today eRx for Amoxil 875 mg  BID for 10 days Work note provided  RTC as needed or if symptoms persist. Nicki ReaperBAITY, Gildo Crisco, NP

## 2018-02-07 NOTE — Patient Instructions (Signed)

## 2018-02-13 ENCOUNTER — Telehealth: Payer: Self-pay | Admitting: Family Medicine

## 2018-02-13 NOTE — Telephone Encounter (Unsigned)
Copied from CRM [phone]. Topic: Referral - Status >> Feb 13, 2018  1:58 PM Waymon AmatoBurton, Donna F wrote: Reason for CRM: pt is checking on the status of her sleep study referral   Best number [phone]

## 2018-02-16 ENCOUNTER — Other Ambulatory Visit: Payer: Self-pay

## 2018-02-22 ENCOUNTER — Encounter: Payer: Self-pay | Admitting: Family Medicine

## 2018-02-28 ENCOUNTER — Telehealth: Payer: Self-pay

## 2018-02-28 NOTE — Telephone Encounter (Signed)
Copied from CRM [phone]. Topic: Referral - Status >> Feb 13, 2018  1:58 PM Waymon AmatoBurton, Donna F wrote: Reason for CRM: pt is checking on the status of her sleep study referral   Best number [phone]  >> Feb 28, 2018 10:15 AM Jolayne Hainesaylor, Brittany L wrote: Patient said she is following up on the referral status. She said she has not heard from anyone. Please forward to Dr Birdie SonsSonnenberg.

## 2018-03-02 ENCOUNTER — Encounter: Payer: Self-pay | Admitting: Family Medicine

## 2018-03-02 NOTE — Telephone Encounter (Signed)
Please advise. I did not see a referral for a sleep study in her chart. Thank you

## 2018-03-02 NOTE — Telephone Encounter (Signed)
Pt states she has been waiting for a month w/ no response, pt would like to know what she can do to get the process going, call pt to advise

## 2018-03-03 NOTE — Telephone Encounter (Signed)
Please advise, she has also sent you a mychart message

## 2018-03-03 NOTE — Telephone Encounter (Signed)
I am not sure why this has not been scheduled. I will forward to Melissa to get set up.

## 2018-03-03 NOTE — Telephone Encounter (Signed)
I had faxed this over to [facility] on 3/19. She will be contacted as soon as they verify her insurance coverage.

## 2018-03-08 ENCOUNTER — Telehealth: Payer: No Typology Code available for payment source | Admitting: Family

## 2018-03-08 DIAGNOSIS — R05 Cough: Secondary | ICD-10-CM

## 2018-03-08 DIAGNOSIS — R059 Cough, unspecified: Secondary | ICD-10-CM

## 2018-03-08 DIAGNOSIS — R0789 Other chest pain: Secondary | ICD-10-CM

## 2018-03-08 DIAGNOSIS — R399 Unspecified symptoms and signs involving the genitourinary system: Secondary | ICD-10-CM

## 2018-03-08 NOTE — Progress Notes (Signed)
Based on what you shared with me it looks like you have a serious condition that should be evaluated in a face to face office visit.  NOTE: If you entered your credit card information for this eVisit, you will not be charged. You may see a "hold" on your card for the $30 but that hold will drop off and you will not have a charge processed.  If you are having a true medical emergency please call 911.  If you need an urgent face to face visit, [place] has four urgent care centers for your convenience.  If you need care fast and have a high deductible or no insurance consider:   [email or web address] to reserve your spot online an avoid wait times  [facility] [address], [address] [place], [place] [address] 8 am to 8 pm Monday-Friday 10 am to 4 pm Saturday-Sunday *Across the street from [employer]  [facility]  [address]  [place], [address] 8 am to 5 pm Monday-Friday * In the [facility]   The following sites will take your  insurance:  . [facility]  [phone] Get Driving Directions Find a Provider at this Location  [address] [place], [place] [address] . 10 am to 8 pm Monday-Friday . 12 pm to 8 pm Saturday-Sunday   .  [facility]  [phone] Get Driving Directions Find a Provider at this Location  [address], [address] [place], [place] [address] . 8 am to 8 pm Monday-Friday . 9 am to 6 pm Saturday . 11 am to 6 pm Sunday   .  [facility] at [facility] [place]  [phone] Get Driving Directions  [address] [place], [place] [address] . 8 am to 8 pm Monday-Friday . 8 am to 4 pm Saturday-Sunday   Your e-visit answers were reviewed by a board certified advanced clinical practitioner to complete your personal care plan.  Thank you for using e-Visits.  

## 2018-03-09 ENCOUNTER — Ambulatory Visit: Payer: Self-pay | Admitting: Internal Medicine

## 2018-03-09 ENCOUNTER — Other Ambulatory Visit: Payer: Self-pay

## 2018-03-09 ENCOUNTER — Ambulatory Visit (INDEPENDENT_AMBULATORY_CARE_PROVIDER_SITE_OTHER): Payer: No Typology Code available for payment source | Admitting: Family Medicine

## 2018-03-09 ENCOUNTER — Encounter: Payer: Self-pay | Admitting: Family Medicine

## 2018-03-09 VITALS — BP 120/80 | HR 90 | Temp 98.8°F | Wt 253.2 lb

## 2018-03-09 DIAGNOSIS — Z13220 Encounter for screening for lipoid disorders: Secondary | ICD-10-CM

## 2018-03-09 DIAGNOSIS — R5383 Other fatigue: Secondary | ICD-10-CM

## 2018-03-09 DIAGNOSIS — N926 Irregular menstruation, unspecified: Secondary | ICD-10-CM | POA: Diagnosis not present

## 2018-03-09 DIAGNOSIS — R350 Frequency of micturition: Secondary | ICD-10-CM | POA: Insufficient documentation

## 2018-03-09 DIAGNOSIS — J988 Other specified respiratory disorders: Secondary | ICD-10-CM | POA: Diagnosis not present

## 2018-03-09 LAB — POCT URINALYSIS DIPSTICK
Bilirubin, UA: NEGATIVE
Blood, UA: NEGATIVE
Glucose, UA: NEGATIVE
KETONES UA: NEGATIVE
Leukocytes, UA: NEGATIVE
NITRITE UA: NEGATIVE
PROTEIN UA: POSITIVE
SPEC GRAV UA: 1.02 (ref 1.010–1.025)
UROBILINOGEN UA: 0.2 U/dL
pH, UA: 6 (ref 5.0–8.0)

## 2018-03-09 LAB — URINALYSIS, MICROSCOPIC ONLY

## 2018-03-09 LAB — POCT URINE PREGNANCY: Preg Test, Ur: NEGATIVE

## 2018-03-09 MED ORDER — PREDNISONE 20 MG PO TABS: 40.0000 mg | ORAL_TABLET | Freq: Every day | ORAL | 0 refills | Status: DC

## 2018-03-09 MED ORDER — AMOXICILLIN-POT CLAVULANATE 875-125 MG PO TABS: 1.0000 | ORAL_TABLET | Freq: Two times a day (BID) | ORAL | 0 refills | Status: DC

## 2018-03-09 MED ORDER — ALBUTEROL SULFATE HFA 108 (90 BASE) MCG/ACT IN AERS: 1.0000 | INHALATION_SPRAY | RESPIRATORY_TRACT | 0 refills | Status: DC | PRN

## 2018-03-09 NOTE — Assessment & Plan Note (Signed)
Urinalysis is relatively unremarkable.  Will send for culture and microscopy to confirm whether or not there is a UTI.

## 2018-03-09 NOTE — Progress Notes (Signed)
p 

## 2018-03-09 NOTE — Progress Notes (Signed)
Tommi Rumps, MD Phone: [phone]  Kim Garcia is a 32 y.o. female who presents today for same-day visit.  Patient notes a week of cough and congestion.  Congested in her face though more so in her chest.  Not coughing anything up anymore.  She has not been wheezing significantly though does feel her airways are tight.  She had a fever of 102 last night.  Has been up and down.  Some postnasal drip and rhinorrhea.  Her inhaler does help a little bit though not as much as typical.  She reports some urinary frequency and urgency as well as some stress incontinence.  No dysuria.  No hematuria.  No stomach pain.  She reports some irregular menses that been going on for some time.  She has not had a menstrual cycle since her visit with [name] in February though notes it typically comes at random times and can be every month or month and a half.  No headaches or vision changes.  Social History   Tobacco Use  Smoking Status Never Smoker  Smokeless Tobacco Never Used     ROS see history of present illness  Objective  Physical Exam Vitals:   03/09/18 1049  BP: 120/80  Pulse: 90  Temp: 98.8 F (37.1 C)  SpO2: 94%    BP Readings from Last 3 Encounters:  03/09/18 120/80  02/07/18 (!) 134/92  02/02/18 132/86   Wt Readings from Last 3 Encounters:  03/09/18 253 lb 3.2 oz (114.9 kg)  02/07/18 248 lb (112.5 kg)  02/02/18 252 lb 3.2 oz (114.4 kg)    Physical Exam  Constitutional: No distress.  HENT:  Head: Normocephalic and atraumatic.  Mouth/Throat: Oropharynx is clear and moist. No oropharyngeal exudate.  Eyes: Pupils are equal, round, and reactive to light. Conjunctivae are normal.  Neck: Neck supple.  Cardiovascular: Normal rate, regular rhythm and normal heart sounds.  Pulmonary/Chest: Effort normal and breath sounds normal.  Abdominal: Soft. Bowel sounds are normal. She exhibits no distension. There is no tenderness. There is no rebound and no guarding.  Musculoskeletal:  She exhibits no edema.  Lymphadenopathy:    She has no cervical adenopathy.  Neurological: She is alert. Gait normal.  Skin: Skin is warm and dry. She is not diaphoretic.     Assessment/Plan: Please see individual problem list.  Respiratory infection Patient symptoms may be related to bronchitis versus pneumonia versus sinusitis given her constellation of symptoms.  She has a relatively benign exam.  Given her fever I discussed obtaining a chest x-ray though she opted to defer this and proceed with treatment first.  Patient reports she is still breast-feeding though does not necessarily have to as she has milk stored for her child who is 55 year old and can bottle feed as well as feed on regular food.  We discussed trying to not breast-feed while taking the antibiotics or prednisone.  Given the potential that she might breast-feed while on antibiotics I would like to avoid azithromycin and doxycycline.  Will cover her with Augmentin as this appears to be an adequate option in breast-feeding though I did discuss the potential risk for GI issues in her child if she were to breast-feed.  Will also cover with prednisone given likely asthmatic component.  Did discuss that this could get into her breast milk and that she should avoid breast-feeding while on this medication as there could be growth issues related to her child being exposed to prednisone through breastmilk.  She will  monitor and if not improving by early next week she will follow-up.  She is given return precautions.  Frequent urination Urinalysis is relatively unremarkable.  Will send for culture and microscopy to confirm whether or not there is a UTI.  Irregular menstrual cycle Negative urine pregnancy test.  She has had her tubes tied.  Discussed that she needs to see her gynecologist for further discussion and evaluation.   Orders Placed This Encounter  Procedures  . Urine Culture  . CBC w/Diff    Standing Status:   Future     Standing Expiration Date:   03/10/2019  . Comp Met (CMET)    Standing Status:   Future    Standing Expiration Date:   03/10/2019  . Hemoglobin A1C    Standing Status:   Future    Standing Expiration Date:   03/10/2019  . Lipid Profile    Standing Status:   Future    Standing Expiration Date:   03/10/2019  . TSH    Standing Status:   Future    Standing Expiration Date:   03/10/2019  . Urine Microscopic Only  . POCT Urinalysis Dipstick  . POCT urine pregnancy    Meds ordered this encounter  Medications  . albuterol (PROVENTIL HFA;VENTOLIN HFA) 108 (90 Base) MCG/ACT inhaler    Sig: Inhale 1 puff into the lungs every 4 (four) hours as needed for wheezing or shortness of breath.    Dispense:  2 Inhaler    Refill:  0  . amoxicillin-clavulanate (AUGMENTIN) 875-125 MG tablet    Sig: Take 1 tablet by mouth 2 (two) times daily.    Dispense:  14 tablet    Refill:  0  . predniSONE (DELTASONE) 20 MG tablet    Sig: Take 2 tablets (40 mg total) by mouth daily with breakfast.    Dispense:  10 tablet    Refill:  0     Tommi Rumps, MD [place]

## 2018-03-09 NOTE — Patient Instructions (Signed)
Nice to see you. We will treat you with Augmentin as well as prednisone for your congestion and likely asthma exacerbation. We will send your urine for culture and microscopy and contact you if it appears you have a UTI. Please try to avoid breast-feeding while on these medications. Please see your gynecologist regarding her irregular menstrual cycle.

## 2018-03-09 NOTE — Assessment & Plan Note (Signed)
Patient symptoms may be related to bronchitis versus pneumonia versus sinusitis given her constellation of symptoms.  She has a relatively benign exam.  Given her fever I discussed obtaining a chest x-ray though she opted to defer this and proceed with treatment first.  Patient reports she is still breast-feeding though does not necessarily have to as she has milk stored for her child who is 32 year old and can bottle feed as well as feed on regular food.  We discussed trying to not breast-feed while taking the antibiotics or prednisone.  Given the potential that she might breast-feed while on antibiotics I would like to avoid azithromycin and doxycycline.  Will cover her with Augmentin as this appears to be an adequate option in breast-feeding though I did discuss the potential risk for GI issues in her child if she were to breast-feed.  Will also cover with prednisone given likely asthmatic component.  Did discuss that this could get into her breast milk and that she should avoid breast-feeding while on this medication as there could be growth issues related to her child being exposed to prednisone through breastmilk.  She will monitor and if not improving by early next week she will follow-up.  She is given return precautions.

## 2018-03-09 NOTE — Assessment & Plan Note (Signed)
Negative urine pregnancy test.  She has had her tubes tied.  Discussed that she needs to see her gynecologist for further discussion and evaluation.

## 2018-03-11 LAB — URINE CULTURE
MICRO NUMBER:: 90388410
SPECIMEN QUALITY:: ADEQUATE

## 2018-03-16 ENCOUNTER — Ambulatory Visit: Payer: No Typology Code available for payment source | Attending: Neurology

## 2018-03-16 DIAGNOSIS — G4733 Obstructive sleep apnea (adult) (pediatric): Secondary | ICD-10-CM | POA: Diagnosis present

## 2018-03-20 NOTE — Telephone Encounter (Signed)
Copied from CRM [phone]. Topic: Inquiry >> Mar 20, 2018 12:51 PM Kim Garcia, Cheri B wrote: Reason for CRM:  PT did sleep study on Thursday and is wanting to know what is next,  she has not heard from anyone-  does she need a cpap?   Pt states it is ok to lvm

## 2018-03-23 ENCOUNTER — Telehealth: Payer: Self-pay | Admitting: Family Medicine

## 2018-03-23 NOTE — Telephone Encounter (Signed)
Order is in your white folder and in your box for additional information that needs to be filled out and your signature.

## 2018-03-23 NOTE — Telephone Encounter (Signed)
Copied from CRM [phone]. Topic: Inquiry >> Mar 20, 2018 12:51 PM Eston Mouldavis, Cheri B wrote: Reason for CRM:  PT did sleep study on Thursday and is wanting to know what is next,  she has not heard from anyone-  does she need a cpap?   Pt states it is ok to lvm >> Mar 23, 2018  9:01 AM Gloriann LoanPayne, Angela L wrote: Pt calling back about sleep study

## 2018-03-23 NOTE — Telephone Encounter (Signed)
Thanks. I will get this filled out for the patient.

## 2018-03-23 NOTE — Telephone Encounter (Signed)
I had not received the results though they are in the computer now.  She does need a CPAP.  I will forward to Melissa to try to get a prescription created for her and we will fax to get this set up for her.  Thanks.

## 2018-03-23 NOTE — Telephone Encounter (Signed)
Please advise 

## 2018-03-23 NOTE — Telephone Encounter (Signed)
Patient notified and will await call about cpap

## 2018-03-29 ENCOUNTER — Telehealth: Payer: Self-pay | Admitting: Family Medicine

## 2018-03-29 NOTE — Telephone Encounter (Signed)
Copied from CRM [phone]. Topic: Quick Communication - See Telephone Encounter >> Mar 29, 2018 12:45 PM Oneal GroutSebastian, Jennifer S wrote: CRM for notification. See Telephone encounter for: 03/29/18. Patient had sleep study over 3 weeks again, checking status with CPAP. Please advise

## 2018-04-03 NOTE — Telephone Encounter (Signed)
Please advise 

## 2018-04-03 NOTE — Telephone Encounter (Signed)
Pt is aware that [facility] and that she will receive a call from them to go over her insurance benefits prior to them delivering the equipment.

## 2018-04-03 NOTE — Telephone Encounter (Signed)
See other message

## 2018-04-03 NOTE — Telephone Encounter (Signed)
Please see the other phone note. I apologize for the delay in getting this completed. I received the form to sign today and have completed the form. Melissa should be faxing this today.

## 2018-04-03 NOTE — Telephone Encounter (Signed)
It appears this was just placed in my box and I have completed this. I apologize as I am not sure why this did not get completed sooner.

## 2018-04-03 NOTE — Telephone Encounter (Signed)
Please see previous note from 03/23/2018 addended

## 2018-04-03 NOTE — Telephone Encounter (Addendum)
Pt following up on this request/rx for a CPAP.  Pt wants to know if this has been done, if so, where did it go? Pt states this has been going on a long time and she is anxious to get her CPAP.

## 2018-05-04 ENCOUNTER — Ambulatory Visit: Payer: Self-pay | Admitting: Family Medicine

## 2018-05-16 ENCOUNTER — Encounter: Payer: Self-pay | Admitting: Family Medicine

## 2018-05-19 ENCOUNTER — Ambulatory Visit (INDEPENDENT_AMBULATORY_CARE_PROVIDER_SITE_OTHER): Payer: No Typology Code available for payment source | Admitting: Internal Medicine

## 2018-05-19 ENCOUNTER — Encounter: Payer: Self-pay | Admitting: Internal Medicine

## 2018-05-19 ENCOUNTER — Ambulatory Visit
Admission: RE | Admit: 2018-05-19 | Discharge: 2018-05-19 | Disposition: A | Discharge status: Home / Self Care | Payer: No Typology Code available for payment source | Source: Ambulatory Visit | Attending: Internal Medicine | Admitting: Internal Medicine

## 2018-05-19 ENCOUNTER — Telehealth: Payer: Self-pay

## 2018-05-19 ENCOUNTER — Other Ambulatory Visit
Admission: RE | Admit: 2018-05-19 | Discharge: 2018-05-19 | Disposition: A | Discharge status: Home / Self Care | Payer: No Typology Code available for payment source | Source: Ambulatory Visit | Attending: Internal Medicine | Admitting: Internal Medicine

## 2018-05-19 ENCOUNTER — Ambulatory Visit: Payer: Self-pay | Admitting: *Deleted

## 2018-05-19 VITALS — BP 136/88 | HR 87 | Temp 98.1°F | Ht 62.0 in | Wt 268.2 lb

## 2018-05-19 DIAGNOSIS — R600 Localized edema: Secondary | ICD-10-CM | POA: Diagnosis present

## 2018-05-19 DIAGNOSIS — D649 Anemia, unspecified: Secondary | ICD-10-CM

## 2018-05-19 DIAGNOSIS — R609 Edema, unspecified: Secondary | ICD-10-CM

## 2018-05-19 DIAGNOSIS — Z1329 Encounter for screening for other suspected endocrine disorder: Secondary | ICD-10-CM

## 2018-05-19 DIAGNOSIS — E559 Vitamin D deficiency, unspecified: Secondary | ICD-10-CM

## 2018-05-19 DIAGNOSIS — R739 Hyperglycemia, unspecified: Secondary | ICD-10-CM | POA: Diagnosis not present

## 2018-05-19 MED ORDER — FUROSEMIDE 20 MG PO TABS: 20.0000 mg | ORAL_TABLET | Freq: Every day | ORAL | 0 refills | Status: DC | PRN

## 2018-05-19 NOTE — Patient Instructions (Addendum)
Take lasix 20 mg in am until follow up  Elevated your legs  [facility]   Lymphedema Lymphedema is swelling that is caused by the abnormal collection of lymph under the skin. Lymph is fluid from the tissues in your body that travels in the lymphatic system. This system is part of the immune system and includes lymph nodes and lymph vessels. The lymph vessels collect and carry the excess fluid, fats, proteins, and wastes from the tissues of the body to the bloodstream. This system also works to clean and remove bacteria and waste products from the body. Lymphedema occurs when the lymphatic system is blocked. When the lymph vessels or lymph nodes are blocked or damaged, lymph does not drain properly, causing an abnormal buildup of lymph. This leads to swelling in the arms or legs. Lymphedema cannot be cured by medicines, but various methods can be used to help reduce the swelling. What are the causes? There are two types of lymphedema. Primary lymphedema is caused by the absence or abnormality of the lymph vessel at birth. Secondary lymphedema is more common. It occurs when the lymph vessel is damaged or blocked. Common causes of lymph vessel blockage include:  Skin infection, such as cellulitis.  Infection by parasites (filariasis).  Injury.  Cancer.  Radiation therapy.  Formation of scar tissue.  Surgery.  What are the signs or symptoms? Symptoms of this condition include:  Swelling of the arm or leg.  A heavy or tight feeling in the arm or leg.  Swelling of the feet, toes, or fingers. Shoes or rings may fit more tightly than before.  Redness of the skin over the affected area.  Limited movement of the affected limb.  Sensitivity to touch or discomfort in the affected limb.  How is this diagnosed? This condition may be diagnosed with:  A physical exam.  Medical history.  Bioimpedance spectroscopy. In this test, painless electrical currents are  used to measure fluid levels in your body.  Imaging tests, such as: ? Lymphoscintigraphy. In this test, a low dose of a radioactive substance is injected to trace the flow of lymph through the lymph vessels. ? MRI. ? CT scan. ? Duplex ultrasound. This test uses sound waves to produce images of the vessels and the blood flow on a screen. ? Lymphangiography. In this test, a contrast dye is injected into the lymph vessel to help show blockages.  How is this treated? Treatment for this condition may depend on the cause. Treatment may include:  Exercise. Certain exercises can help fluid move out of the affected limb.  Massage. Gentle massage of the affected limb can help move the fluid out of the area.  Compression. Various methods may be used to apply pressure to the affected limb in order to reduce the swelling. ? Wearing compression stockings or sleeves on the affected limb. ? Bandaging the affected limb. ? Using an external pump that is attached to a sleeve that alternates between applying pressure and releasing pressure.  Surgery. This is usually only done for severe cases. For example, surgery may be done if you have trouble moving the limb or if the swelling does not get better with other treatments.  If an underlying condition is causing the lymphedema, treatment for that condition is needed. For example, antibiotic medicines may be used to treat an infection. Follow these instructions at home: Activities  Exercise regularly as directed by your health care provider.  Do not sit with your  legs crossed.  When possible, keep the affected limb raised (elevated) above the level of your heart.  Avoid carrying things with an arm that is affected by lymphedema.  Remember that the affected area is more likely to become injured or infected.  Take these steps to help prevent infection: ? Keep the affected area clean and dry. ? Protect your skin from cuts. For example, you should use  gloves while cooking or gardening. Do not walk barefoot. If you shave the affected area, use an Neurosurgeon. General instructions  Take medicines only as directed by your health care provider.  Eat a healthy diet that includes a lot of fruits and vegetables.  Do not wear tight clothes, shoes, or jewelry.  Do not use heating pads over the affected area.  Avoid having blood pressure checked on the affected limb.  Keep all follow-up visits as directed by your health care provider. This is important. Contact a health care provider if:  You continue to have swelling in your limb.  You have a fever.  You have a cut that does not heal.  You have redness or pain in the affected area.  You have new swelling in your limb that comes on suddenly.  You develop purplish spots or sores (lesions) on your limb. Get help right away if:  You have a skin rash.  You have chills or sweats.  You have shortness of breath. This information is not intended to replace advice given to you by your health care provider. Make sure you discuss any questions you have with your health care provider. Document Released: 09/26/2007 Document Revised: 08/05/2016 Document Reviewed: 11/06/2014 Elsevier Interactive Patient Education  2018 [employer].  Edema Edema is an abnormal buildup of fluids in your bodytissues. Edema is somewhatdependent on gravity to pull the fluid to the lowest place in your body. That makes the condition more common in the legs and thighs (lower extremities). Painless swelling of the feet and ankles is common and becomes more likely as you get older. It is also common in looser tissues, like around your eyes. When the affected area is squeezed, the fluid may move out of that spot and leave a dent for a few moments. This dent is called pitting. What are the causes? There are many possible causes of edema. Eating too much salt and being on your feet or sitting for a long time can cause  edema in your legs and ankles. Hot weather may make edema worse. Common medical causes of edema include:  Heart failure.  Liver disease.  Kidney disease.  Weak blood vessels in your legs.  Cancer.  An injury.  Pregnancy.  Some medications.  Obesity.  What are the signs or symptoms? Edema is usually painless.Your skin may look swollen or shiny. How is this diagnosed? Your health care provider may be able to diagnose edema by asking about your medical history and doing a physical exam. You may need to have tests such as X-rays, an electrocardiogram, or blood tests to check for medical conditions that may cause edema. How is this treated? Edema treatment depends on the cause. If you have heart, liver, or kidney disease, you need the treatment appropriate for these conditions. General treatment may include:  Elevation of the affected body part above the level of your heart.  Compression of the affected body part. Pressure from elastic bandages or support stockings squeezes the tissues and forces fluid back into the blood vessels. This keeps fluid from entering  the tissues.  Restriction of fluid and salt intake.  Use of a water pill (diuretic). These medications are appropriate only for some types of edema. They pull fluid out of your body and make you urinate more often. This gets rid of fluid and reduces swelling, but diuretics can have side effects. Only use diuretics as directed by your health care provider.  Follow these instructions at home:  Keep the affected body part above the level of your heart when you are lying down.  Do not sit still or stand for prolonged periods.  Do not put anything directly under your knees when lying down.  Do not wear constricting clothing or garters on your upper legs.  Exercise your legs to work the fluid back into your blood vessels. This may help the swelling go down.  Wear elastic bandages or support stockings to reduce ankle  swelling as directed by your health care provider.  Eat a low-salt diet to reduce fluid if your health care provider recommends it.  Only take medicines as directed by your health care provider. Contact a health care provider if:  Your edema is not responding to treatment.  You have heart, liver, or kidney disease and notice symptoms of edema.  You have edema in your legs that does not improve after elevating them.  You have sudden and unexplained weight gain. Get help right away if:  You develop shortness of breath or chest pain.  You cannot breathe when you lie down.  You develop pain, redness, or warmth in the swollen areas.  You have heart, liver, or kidney disease and suddenly get edema.  You have a fever and your symptoms suddenly get worse. This information is not intended to replace advice given to you by your health care provider. Make sure you discuss any questions you have with your health care provider. Document Released: 11/29/2005 Document Revised: 05/06/2016 Document Reviewed: 09/21/2013 Elsevier Interactive Patient Education  2017 [employer].

## 2018-05-19 NOTE — Telephone Encounter (Signed)
Copied from CRM [phone]. Topic: Appointment Scheduling - Scheduling Inquiry for Clinic >> May 19, 2018 10:42 AM Diana EvesHoyt, Maryann B wrote: Reason for CRM: pt is needing a follow up regarding CPAP with PCP for insurance. She is needing it by sometime in July.

## 2018-05-19 NOTE — Telephone Encounter (Signed)
Pt is having swelling in legs since starting using her CPAP machine about 2 to 3 weeks ago.     Patient states she has new onset bilateral leg swelling. She does elevate- but the swelling does not completely go away. She does report that the left leg is worse than the right leg. She did have pain with a bruise on her leg a few days ago. Appointment made per protocol. Reason for Disposition . [1] Thigh, calf, or ankle swelling AND [2] bilateral AND [3] 1 side is more swollen  Answer Assessment - Initial Assessment Questions 1. ONSET: "When did the swelling start?" (e.g., minutes, hours, days)     2-3 weeks 2. LOCATION: "What part of the leg is swollen?"  "Are both legs swollen or just one leg?"     Lower leg- calf down- worse on left than right leg 3. SEVERITY: "How bad is the swelling?" (e.g., localized; mild, moderate, severe)  - Localized - small area of swelling localized to one leg  - MILD pedal edema - swelling limited to foot and ankle, pitting edema < 1/4 inch (6 mm) deep, rest and elevation eliminate most or all swelling  - MODERATE edema - swelling of lower leg to knee, pitting edema > 1/4 inch (6 mm) deep, rest and elevation only partially reduce swelling  - SEVERE edema - swelling extends above knee, facial or hand swelling present      Knee down- elevation helps some- moderate 4. REDNESS: "Does the swelling look red or infected?"     no 5. PAIN: "Is the swelling painful to touch?" If so, ask: "How painful is it?"   (Scale 1-10; mild, moderate or severe)     No pain 6. FEVER: "Do you have a fever?" If so, ask: "What is it, how was it measured, and when did it start?"      no 7. CAUSE: "What do you think is causing the leg swelling?"     Unsure- diet,wieght gain  8. MEDICAL HISTORY: "Do you have a history of heart failure, kidney disease, liver failure, or cancer?"     no 9. RECURRENT SYMPTOM: "Have you had leg swelling before?" If so, ask: "When was the last time?" "What  happened that time?"     During pregnancy 10. OTHER SYMPTOMS: "Do you have any other symptoms?" (e.g., chest pain, difficulty breathing)       Patient feels much better since she started using the CPAP 11. PREGNANCY: "Is there any chance you are pregnant?" "When was your last menstrual period?"       No-BTL  LMP- end of May  Protocols used: LEG SWELLING AND EDEMA-A-AH

## 2018-05-19 NOTE — Telephone Encounter (Signed)
[facility]

## 2018-05-19 NOTE — Progress Notes (Signed)
Chief Complaint  Patient presents with  . Leg Swelling   F/u 1. C/o b/l leg edema L>R x 2 weeks since using cpap no sob or chest pain. Also had posterior right knee pain and bruising per her husband. Leg pain is better but still has leg swelling L> right   Review of Systems  Constitutional: Negative for weight loss.  HENT: Negative for hearing loss.   Eyes: Negative for blurred vision.  Respiratory: Negative for shortness of breath.   Cardiovascular: Positive for leg swelling. Negative for chest pain.  Musculoskeletal:       Knee pain posterior improving   Skin: Negative for rash.  Neurological: Negative for headaches.  Psychiatric/Behavioral: Negative for depression.   Past Medical History:  Diagnosis Date  . Anemia   . Asthma    as child, rarely uses inhaler  . Depression   . History of blood transfusion 05/2011   1 unit transfused at [facility]  . Missed ab 2014   no surgery required  . Seasonal allergies   . SVD (spontaneous vaginal delivery) 05/2011   x 1   Past Surgical History:  Procedure Laterality Date  . CESAREAN SECTION N/A 09/27/2014   Procedure: Primary CESAREAN SECTION;  Surgeon: Serita Kyle, MD;  Location: [facility];  Service: Obstetrics;  Laterality: N/A;  EDD: 10/04/14  . CESAREAN SECTION WITH BILATERAL TUBAL LIGATION N/A 02/25/2017   Procedure: REPEAT CESAREAN SECTION WITH BILATERAL TUBAL LIGATION;  Surgeon: Maxie Better, MD;  Location: [facility];  Service: Obstetrics;  Laterality: N/A;  EDD: 02/28/17  . WISDOM TOOTH EXTRACTION     Family History  Problem Relation Age of Onset  . Alcoholism Unknown        Parent  . Colon cancer Maternal Grandmother   . Diabetes Maternal Grandmother   . Stroke Paternal Grandmother   . Diabetes Paternal Grandmother    Social History   Socioeconomic History  . Marital status: Married    Spouse name: Not on file  . Number of children: Not on file  . Years of education: Not on file  . Highest education  level: Not on file  Occupational History  . Not on file  Social Needs  . Financial resource strain: Not on file  . Food insecurity:    Worry: Not on file    Inability: Not on file  . Transportation needs:    Medical: Not on file    Non-medical: Not on file  Tobacco Use  . Smoking status: Never Smoker  . Smokeless tobacco: Never Used  Substance and Sexual Activity  . Alcohol use: No  . Drug use: No  . Sexual activity: Yes    Birth control/protection: None    Comment: pregnant  Lifestyle  . Physical activity:    Days per week: Not on file    Minutes per session: Not on file  . Stress: Not on file  Relationships  . Social connections:    Talks on phone: Not on file    Gets together: Not on file    Attends religious service: Not on file    Active member of club or organization: Not on file    Attends meetings of clubs or organizations: Not on file    Relationship status: Not on file  . Intimate partner violence:    Fear of current or ex partner: Not on file    Emotionally abused: Not on file    Physically abused: Not on file    Forced  sexual activity: Not on file  Other Topics Concern  . Not on file  Social History Narrative  . Not on file   Current Meds  Medication Sig  . albuterol (PROVENTIL HFA;VENTOLIN HFA) 108 (90 Base) MCG/ACT inhaler Inhale 1 puff into the lungs every 4 (four) hours as needed for wheezing or shortness of breath.  . cetirizine (ZYRTEC) 10 MG tablet Take 10 mg by mouth daily as needed for allergies.  Marland Kitchen. ibuprofen (ADVIL,MOTRIN) 600 MG tablet Take 1 tablet (600 mg total) by mouth every 6 (six) hours.  . SUMAtriptan (IMITREX) 50 MG tablet Take 1 tablet (50 mg total) by mouth every 2 (two) hours as needed for migraine. May repeat in 2 hours if headache persists or recurs.  . [DISCONTINUED] amoxicillin-clavulanate (AUGMENTIN) 875-125 MG tablet Take 1 tablet by mouth 2 (two) times daily.  . [DISCONTINUED] predniSONE (DELTASONE) 20 MG tablet Take 2 tablets  (40 mg total) by mouth daily with breakfast.  . [DISCONTINUED] Prenatal Vit-Fe Fumarate-FA (PRENATAL MULTIVITAMIN) TABS tablet Take 1 tablet by mouth daily at 12 noon.   Allergies  Allergen Reactions  . Tape Dermatitis    Caused skin irritation, and tore skin when removing   Recent Results (from the past 2160 hour(s))  POCT Urinalysis Dipstick     Status: None   Collection Time: 03/09/18 11:00 AM  Result Value Ref Range   Color, UA yellow    Clarity, UA clear    Glucose, UA negative    Bilirubin, UA negative    Ketones, UA negative    Spec Grav, UA 1.020 1.010 - 1.025   Blood, UA negative    pH, UA 6.0 5.0 - 8.0   Protein, UA positive     Comment: +15   Urobilinogen, UA 0.2 0.2 or 1.0 E.U./dL   Nitrite, UA negative    Leukocytes, UA Negative Negative   Appearance     Odor    POCT urine pregnancy     Status: None   Collection Time: 03/09/18 11:03 AM  Result Value Ref Range   Preg Test, Ur Negative Negative  Urine Culture     Status: Abnormal   Collection Time: 03/09/18  2:22 PM  Result Value Ref Range   MICRO NUMBER: [id number]    SPECIMEN QUALITY: ADEQUATE    Sample Source URINE    STATUS: FINAL    ISOLATE 1: Escherichia coli (A)     Comment: 50,000-100,000 CFU/mL of Escherichia coli      Susceptibility   Escherichia coli - URINE CULTURE, REFLEX    AMOX/CLAVULANIC 4 Sensitive     AMPICILLIN >=32 Resistant     AMPICILLIN/SULBACTAM >=32 Resistant     CEFAZOLIN* <=4 Not Reportable      * For infections other than uncomplicated UTIcaused by E. coli, K. pneumoniae or P. mirabilis:Cefazolin is resistant if MIC > or = 8 mcg/mL.(Distinguishing susceptible versus intermediatefor isolates with MIC < or = 4 mcg/mL requiresadditional testing.)For uncomplicated UTI caused by E. coli,K. pneumoniae or P. mirabilis: Cefazolin issusceptible if MIC <32 mcg/mL and predictssusceptible to the oral agents cefaclor, cefdinir,cefpodoxime, cefprozil, cefuroxime, cephalexinand loracarbef.     CEFEPIME <=1 Sensitive     CEFTRIAXONE <=1 Sensitive     CIPROFLOXACIN <=0.25 Sensitive     LEVOFLOXACIN <=0.12 Sensitive     ERTAPENEM <=0.5 Sensitive     GENTAMICIN <=1 Sensitive     IMIPENEM <=0.25 Sensitive     NITROFURANTOIN 32 Sensitive     PIP/TAZO <=4 Sensitive  TOBRAMYCIN <=1 Sensitive     TRIMETH/SULFA* <=20 Sensitive      * For infections other than uncomplicated UTIcaused by E. coli, K. pneumoniae or P. mirabilis:Cefazolin is resistant if MIC > or = 8 mcg/mL.(Distinguishing susceptible versus intermediatefor isolates with MIC < or = 4 mcg/mL requiresadditional testing.)For uncomplicated UTI caused by E. coli,K. pneumoniae or P. mirabilis: Cefazolin issusceptible if MIC <32 mcg/mL and predictssusceptible to the oral agents cefaclor, cefdinir,cefpodoxime, cefprozil, cefuroxime, cephalexinand loracarbef.Legend:S = Susceptible  I = IntermediateR = Resistant  NS = Not susceptible* = Not tested  NR = Not reported**NN = See antimicrobic comments  Urine Microscopic Only     Status: Abnormal   Collection Time: 03/09/18  2:22 PM  Result Value Ref Range   WBC, UA 0-2/hpf 0-2/hpf   Squamous Epithelial / LPF Few(5-10/hpf) (A) Rare(0-4/hpf)   Bacteria, UA Rare(<10/hpf) (A) None   Objective  Body mass index is 49.05 kg/m. Wt Readings from Last 3 Encounters:  05/19/18 268 lb 3.2 oz (121.7 kg)  03/09/18 253 lb 3.2 oz (114.9 kg)  02/07/18 248 lb (112.5 kg)   Temp Readings from Last 3 Encounters:  05/19/18 98.1 F (36.7 C) (Oral)  03/09/18 98.8 F (37.1 C) (Oral)  02/07/18 99.5 F (37.5 C) (Oral)   BP Readings from Last 3 Encounters:  05/19/18 136/88  03/09/18 120/80  02/07/18 (!) 134/92   Pulse Readings from Last 3 Encounters:  05/19/18 87  03/09/18 90  02/07/18 86    Physical Exam  Constitutional: She is oriented to person, place, and time. Vital signs are normal. She appears well-developed and well-nourished. She is cooperative.  HENT:  Head: Normocephalic and  atraumatic.  Mouth/Throat: Oropharynx is clear and moist and mucous membranes are normal.  Eyes: Pupils are equal, round, and reactive to light. Conjunctivae are normal.  Cardiovascular: Normal rate, regular rhythm and normal heart sounds.  Pulmonary/Chest: Effort normal and breath sounds normal.  Musculoskeletal:  Mild ttp right post knee  Neurological: She is alert and oriented to person, place, and time. Gait normal.  Skin: Skin is warm, dry and intact.  Trace to mild pitting edema left leg non pitting edema right leg   Psychiatric: She has a normal mood and affect. Her speech is normal and behavior is normal. Judgment and thought content normal. Cognition and memory are normal.  Nursing note and vitals reviewed.   Assessment   1. B/l leg edema r/o DVT vs lymphedema with post right knee pain r/o bakers cyst  2. OSA on cpap helping  3. HM Plan  1.  Lasix 20 mg qd trial  Labs today at [facility]  Consider echo in future, do proBNP today  2.  Cont cpap 3.  Labs today consider lipid in future  Declines std check  Provider: Dr. French Ana McLean-Scocuzza-[facility] Medicine

## 2018-05-19 NOTE — Addendum Note (Signed)
Addended by: Quentin OreMCLEAN-SCOCUZZA, Chaney Ingram on: 05/19/2018 03:58 PM   Modules accepted: Orders

## 2018-05-19 NOTE — Progress Notes (Signed)
Pre visit review using our clinic review tool, if applicable. No additional management support is needed unless otherwise documented below in the visit note. 

## 2018-05-22 ENCOUNTER — Other Ambulatory Visit
Admission: RE | Admit: 2018-05-22 | Discharge: 2018-05-22 | Disposition: A | Discharge status: Home / Self Care | Payer: No Typology Code available for payment source | Source: Ambulatory Visit | Attending: Internal Medicine | Admitting: Internal Medicine

## 2018-05-22 DIAGNOSIS — D649 Anemia, unspecified: Secondary | ICD-10-CM | POA: Insufficient documentation

## 2018-05-22 DIAGNOSIS — R600 Localized edema: Secondary | ICD-10-CM | POA: Diagnosis present

## 2018-05-22 DIAGNOSIS — R739 Hyperglycemia, unspecified: Secondary | ICD-10-CM | POA: Diagnosis not present

## 2018-05-22 DIAGNOSIS — E559 Vitamin D deficiency, unspecified: Secondary | ICD-10-CM | POA: Insufficient documentation

## 2018-05-22 DIAGNOSIS — Z1329 Encounter for screening for other suspected endocrine disorder: Secondary | ICD-10-CM | POA: Diagnosis not present

## 2018-05-22 LAB — IRON AND TIBC
Iron: 34 ug/dL (ref 28–170)
Saturation Ratios: 10 % — ABNORMAL LOW (ref 10.4–31.8)
TIBC: 340 ug/dL (ref 250–450)
UIBC: 307 ug/dL

## 2018-05-22 LAB — CBC WITH DIFFERENTIAL/PLATELET
Basophils Absolute: 0.1 10*3/uL (ref 0–0.1)
Basophils Relative: 1 %
EOS ABS: 0.2 10*3/uL (ref 0–0.7)
EOS PCT: 2 %
HCT: 41.9 % (ref 35.0–47.0)
Hemoglobin: 14.2 g/dL (ref 12.0–16.0)
Lymphocytes Relative: 26 %
Lymphs Abs: 2.4 10*3/uL (ref 1.0–3.6)
MCH: 27.7 pg (ref 26.0–34.0)
MCHC: 33.8 g/dL (ref 32.0–36.0)
MCV: 82.1 fL (ref 80.0–100.0)
MONO ABS: 0.5 10*3/uL (ref 0.2–0.9)
Monocytes Relative: 5 %
Neutro Abs: 5.9 10*3/uL (ref 1.4–6.5)
Neutrophils Relative %: 66 %
PLATELETS: 210 10*3/uL (ref 150–440)
RBC: 5.1 MIL/uL (ref 3.80–5.20)
RDW: 13.2 % (ref 11.5–14.5)
WBC: 9 10*3/uL (ref 3.6–11.0)

## 2018-05-22 LAB — COMPREHENSIVE METABOLIC PANEL
ALT: 17 U/L (ref 14–54)
ANION GAP: 10 (ref 5–15)
AST: 21 U/L (ref 15–41)
Albumin: 4.2 g/dL (ref 3.5–5.0)
Alkaline Phosphatase: 102 U/L (ref 38–126)
BUN: 10 mg/dL (ref 6–20)
CHLORIDE: 103 mmol/L (ref 101–111)
CO2: 25 mmol/L (ref 22–32)
CREATININE: 0.58 mg/dL (ref 0.44–1.00)
Calcium: 9.1 mg/dL (ref 8.9–10.3)
GFR calc non Af Amer: >60 mL/min (ref >60)
Glucose, Bld: 109 mg/dL — ABNORMAL HIGH (ref 65–99)
POTASSIUM: 3.5 mmol/L (ref 3.5–5.1)
SODIUM: 138 mmol/L (ref 135–145)
Total Bilirubin: 0.5 mg/dL (ref 0.3–1.2)
Total Protein: 7.8 g/dL (ref 6.5–8.1)

## 2018-05-22 LAB — BRAIN NATRIURETIC PEPTIDE: B NATRIURETIC PEPTIDE 5: 11 pg/mL (ref 0.0–100.0)

## 2018-05-22 LAB — FERRITIN: FERRITIN: 32 ng/mL (ref 11–307)

## 2018-05-22 LAB — TSH: TSH: 0.852 u[IU]/mL (ref 0.350–4.500)

## 2018-05-22 LAB — HEMOGLOBIN A1C
Hgb A1c MFr Bld: 5 % (ref 4.8–5.6)
Mean Plasma Glucose: 96.8 mg/dL

## 2018-05-22 LAB — T4, FREE: Free T4: 0.99 ng/dL (ref 0.82–1.77)

## 2018-05-23 ENCOUNTER — Other Ambulatory Visit: Payer: Self-pay | Admitting: Internal Medicine

## 2018-05-23 DIAGNOSIS — E559 Vitamin D deficiency, unspecified: Secondary | ICD-10-CM

## 2018-05-23 LAB — VITAMIN D 25 HYDROXY (VIT D DEFICIENCY, FRACTURES): Vit D, 25-Hydroxy: 13.4 ng/mL — ABNORMAL LOW (ref 30.0–100.0)

## 2018-05-23 MED ORDER — CHOLECALCIFEROL 1.25 MG (50000 UT) PO CAPS: 50000.0000 [IU] | ORAL_CAPSULE | ORAL | 1 refills | Status: DC

## 2018-05-24 ENCOUNTER — Telehealth: Payer: Self-pay | Admitting: Family Medicine

## 2018-05-24 NOTE — Telephone Encounter (Signed)
Copied from CRM [phone]. Topic: Quick Communication - Lab Results >> May 24, 2018 10:40 AM Stephannie LiSimmons, Luzmaria Devaux L, NT wrote: Patient returned call from office for lab results please call [phone] and triage may disclose

## 2018-05-29 ENCOUNTER — Encounter: Payer: Self-pay | Admitting: Family Medicine

## 2018-06-01 ENCOUNTER — Telehealth: Payer: Self-pay | Admitting: Family Medicine

## 2018-06-01 DIAGNOSIS — G4733 Obstructive sleep apnea (adult) (pediatric): Secondary | ICD-10-CM

## 2018-06-01 NOTE — Telephone Encounter (Signed)
Please let the patient know we received her CPAP compliance report.  It still appears that she is having a mild to moderate number of apneic episodes per hour.  I would suggest that we have her see a sleep specialist to help evaluate your CPAP and sleep apnea.  If she is willing I can place a referral.  Thanks.

## 2018-06-02 NOTE — Telephone Encounter (Signed)
Referral placed.

## 2018-06-02 NOTE — Telephone Encounter (Signed)
fyi

## 2018-06-02 NOTE — Telephone Encounter (Signed)
Pt called back and states she is ok with the referral being placed for a sleep specialist.

## 2018-06-02 NOTE — Telephone Encounter (Signed)
Left message to return call, ok for pec to speak to patient about message below 

## 2018-06-02 NOTE — Addendum Note (Signed)
Addended by: Glori LuisSONNENBERG, Shonta Phillis G on: 06/02/2018 05:06 PM   Modules accepted: Orders

## 2018-06-12 ENCOUNTER — Encounter: Payer: Self-pay | Admitting: Internal Medicine

## 2018-06-13 NOTE — Progress Notes (Signed)
[facility] Medicine Consultation      Assessment and Plan:  Severe obstructive sleep apnea with excessive daytime sleepiness. Morbid obesity. Tonsillar enlargement  - Mildly elevated residual average AHI of 8 with peak AHI on some nights as high as 20.  In addition the patient has continued symptoms of excessive daytime sleepiness however her symptoms overall are significantly better, she is no longer snoring as well.  Patient demonstrates good compliance with CPAP. --Recommend that the patient be switched to auto-CPAP with pressure range of 11-20. If auto-CPAP is not available, the CPAP pressure could be increased to 13. - If symptoms not resolved or if pressures are not tolerated, could consider tonsillectomy which could reduce the CPAP pressure requirements and degree of sleep apnea.   Date: 06/14/2018  MRN# [id number] Kim MacSara P Motl 1986-09-17    Kim Garcia is a 32 y.o. old female seen in consultation for chief complaint of:    Chief Complaint  Patient presents with  . Consult    Referred by Dr. Birdie SonsSonnenberg for CPAP maintanance: Pt currently on cpap     HPI:  The patient is a 32 year old female who was recently diagnosed with obstructive sleep apnea approximately in April 2019, with severe obstructive sleep apnea on a split-night study with an AHI of 114. She has been on CPAP for about a month now.   She was noting that she was exhausted much of the time, she has a 315 mo old child, she attributed her fatigue to having a newborn. However as her baby started sleeping through the night, she was still feeling very tired. She was falling asleep doing notes at work, and having trouble concentrating.  Since starting on CPAP she is feeling better, she is less sleepy during the day but she was having some le edema.  She still occasionally is having drowsiness late in the day. In addition the patient notes that she has been having symptoms of lower extremity edema, particularly in  the left leg.  She was sent for lower extremity Dopplers which were negative reportedly for blood clots.  Review of data **Download data 05/14/2018-06/12/2018 usage greater than 4 hours is 27/30 days.  Average usage on days used 7 hours 30 minutes.  Set pressure is 11, residual AHI is 8.8.  Overall this shows very good compliance with moderate control of sleep apnea. **Sleep study split-night 03/16/2018>> severe OSA with AHI of 114, CPAP titrated to high as pressure of 11, at which point AHI 0.  Supine REM was achieved at a pressure of 6.  AHI as high as 17 at a pressure of 9, AHI then decreased below 5 at higher pressures.  Patient spent 114 minutes at a pressure of 10 with an AHI of 0.5.  Raw data from sleep study personally reviewed.   PMHX:   Past Medical History:  Diagnosis Date  . Anemia   . Asthma    as child, rarely uses inhaler  . Depression   . History of blood transfusion 05/2011   [facility]  . Missed ab 2014   no surgery required  . Seasonal allergies   . SVD (spontaneous vaginal delivery) 05/2011   x 1   Surgical Hx:  Past Surgical History:  Procedure Laterality Date  . CESAREAN SECTION N/A 09/27/2014   Procedure: Primary CESAREAN SECTION;  Surgeon: Serita KyleSheronette A Cousins, MD;  Location: [facility] ORS;  Service: Obstetrics;  Laterality: N/A;  EDD: 10/04/14  . CESAREAN SECTION WITH BILATERAL  TUBAL LIGATION N/A 02/25/2017   Procedure: REPEAT CESAREAN SECTION WITH BILATERAL TUBAL LIGATION;  Surgeon: Maxie Better, MD;  Location: [facility]IRTHING SU[facility];  Service: Obstetrics;  Laterality: N/A;  EDD: 02/28/17  . WISDOM TOOTH EXTRACTION     Family Hx:  Family History  Problem Relation Age of Onset  . Alcoholism Unknown        Parent  . Colon cancer Maternal Grandmother   . Diabetes Maternal Grandmother   . Stroke Paternal Grandmother   . Diabetes Paternal Grandmother    Social Hx:   Social History   Tobacco Use  . Smoking status: Never Smoker  . Smokeless tobacco:  Never Used  Substance Use Topics  . Alcohol use: No  . Drug use: No   Medication:    Current Outpatient Medications:  .  albuterol (PROVENTIL HFA;VENTOLIN HFA) 108 (90 Base) MCG/ACT inhaler, Inhale 1 puff into the lungs every 4 (four) hours as needed for wheezing or shortness of breath., Disp: 2 Inhaler, Rfl: 0 .  cetirizine (ZYRTEC) 10 MG tablet, Take 10 mg by mouth daily as needed for allergies., Disp: , Rfl:  .  Cholecalciferol 50000 units capsule, Take 1 capsule (50,000 Units total) by mouth once a week., Disp: 13 capsule, Rfl: 1 .  furosemide (LASIX) 20 MG tablet, Take 1 tablet (20 mg total) by mouth daily as needed., Disp: 30 tablet, Rfl: 0 .  ibuprofen (ADVIL,MOTRIN) 600 MG tablet, Take 1 tablet (600 mg total) by mouth every 6 (six) hours., Disp: 30 tablet, Rfl: 0 .  SUMAtriptan (IMITREX) 50 MG tablet, Take 1 tablet (50 mg total) by mouth every 2 (two) hours as needed for migraine. May repeat in 2 hours if headache persists or recurs., Disp: 10 tablet, Rfl: 1   Allergies:  Tape  Review of Systems: Gen:  Denies  fever, sweats, chills HEENT: c/o leg swelling.  Cvc:  No dizziness, chest pain. Resp:   Denies cough or sputum production, shortness of breath Gi: Denies swallowing difficulty, stomach pain. Gu:  Denies bladder incontinence, burning urine Ext:   No Joint pain, stiffness.  Positive lower extremity edema Skin: No skin rash,  hives  Endoc:  No polyuria, polydipsia. Psych: No depression, insomnia. Other:  All other systems were reviewed with the patient and were negative other that what is mentioned in the HPI.   Physical Examination:   VS: BP 90/60 (BP Location: Right Arm, Cuff Size: Large)   Pulse 87   Resp 16   Ht 5\' 2"  (1.575 m)   SpO2 97%   BMI 49.05 kg/m   General Appearance: No distress  Neuro:without focal findings,  speech normal,  HEENT: PERRLA, EOM intact.  Mallampati 3; tonsillar enlargment.  Pulmonary: normal breath sounds, No wheezing.    CardiovascularNormal S1,S2.  No m/r/g.   Abdomen: Benign, Soft, non-tender. Renal:  No costovertebral tenderness  GU:  No performed at this time. Endoc: No evident thyromegaly, no signs of acromegaly. Skin:   warm, no rashes, no ecchymosis  Extremities: normal, no cyanosis, clubbing.  Other findings:    LABORATORY PANEL:   CBC No results for input(s): WBC, HGB, HCT, PLT in the last 168 hours. ------------------------------------------------------------------------------------------------------------------  Chemistries  No results for input(s): NA, K, CL, CO2, GLUCOSE, BUN, CREATININE, CALCIUM, MG, AST, ALT, ALKPHOS, BILITOT in the last 168 hours.  Invalid input(s): GFRCGP ------------------------------------------------------------------------------------------------------------------  Cardiac Enzymes No results for input(s): TROPONINI in the last 168 hours. ------------------------------------------------------------  RADIOLOGY:  No results found.     Thank  you for the consultation and for allowing [facility] to assist in the care of your patient. Our recommendations are noted above.  Please contact us if we can be of further service.   Wells Guiles, M.D., F.C.C.P.  Board Certified in Internal Medicine, Pulmonary Medicine, Critical Care Medicine, and Sleep Medicine.   Pulmonary and Critical Care Office Number: [phone]   06/14/2018

## 2018-06-14 ENCOUNTER — Encounter: Payer: Self-pay | Admitting: Internal Medicine

## 2018-06-14 ENCOUNTER — Ambulatory Visit (INDEPENDENT_AMBULATORY_CARE_PROVIDER_SITE_OTHER): Payer: No Typology Code available for payment source | Admitting: Internal Medicine

## 2018-06-14 VITALS — BP 90/60 | HR 87 | Resp 16 | Ht 62.0 in

## 2018-06-14 DIAGNOSIS — G4733 Obstructive sleep apnea (adult) (pediatric): Secondary | ICD-10-CM | POA: Diagnosis not present

## 2018-06-14 DIAGNOSIS — G4719 Other hypersomnia: Secondary | ICD-10-CM | POA: Diagnosis not present

## 2018-06-14 NOTE — Patient Instructions (Addendum)
Recommend that the patient be switched to auto CPAP with pressure range of 11-20. If auto CPAP is not available, the CPAP pressure could be increased to 13.  Continue to use CPAP every night, the whole night.    Sleep Apnea    Sleep apnea is disorder that affects a person's sleep. A person with sleep apnea has abnormal pauses in their breathing when they sleep. It is hard for them to get a good sleep. This makes a person tired during the day. It also can lead to other physical problems. There are three types of sleep apnea. One type is when breathing stops for a short time because your airway is blocked (obstructive sleep apnea). Another type is when the brain sometimes fails to give the normal signal to breathe to the muscles that control your breathing (central sleep apnea). The third type is a combination of the other two types.  HOME CARE   Take all medicine as told by your doctor.  Avoid alcohol, calming medicines (sedatives), and depressant drugs.  Try to lose weight if you are overweight. Talk to your doctor about a healthy weight goal.  Your doctor may have you use a device that helps to open your airway. It can help you get the air that you need. It is called a positive airway pressure (PAP) device.   MAKE SURE YOU:   Understand these instructions.  Will watch your condition.  Will get help right away if you are not doing well or get worse.  It may take approximately 1 month for you to get used to wearing her CPAP every night.  Be sure to work with your machine to get used to it, be patient, it may take time!  If you have trouble tolerating CPAP DO NOT RETURN YOUR MACHINE; Contact our office to see if we can help you tolerate the CPAP better first!

## 2018-06-14 NOTE — Addendum Note (Signed)
Addended by: Janean SarkSNIPES, Uzziel Russey K on: 06/14/2018 02:29 PM   Modules accepted: Orders

## 2018-06-16 ENCOUNTER — Encounter

## 2018-06-16 ENCOUNTER — Encounter: Payer: Self-pay | Admitting: Family Medicine

## 2018-06-16 ENCOUNTER — Ambulatory Visit (INDEPENDENT_AMBULATORY_CARE_PROVIDER_SITE_OTHER): Payer: No Typology Code available for payment source | Admitting: Family Medicine

## 2018-06-16 VITALS — BP 116/78 | HR 91 | Temp 98.2°F | Resp 16 | Ht 62.0 in | Wt 267.6 lb

## 2018-06-16 DIAGNOSIS — R8290 Unspecified abnormal findings in urine: Secondary | ICD-10-CM

## 2018-06-16 DIAGNOSIS — R600 Localized edema: Secondary | ICD-10-CM | POA: Diagnosis not present

## 2018-06-16 DIAGNOSIS — E559 Vitamin D deficiency, unspecified: Secondary | ICD-10-CM | POA: Diagnosis not present

## 2018-06-16 DIAGNOSIS — E611 Iron deficiency: Secondary | ICD-10-CM | POA: Insufficient documentation

## 2018-06-16 LAB — POCT URINALYSIS DIPSTICK
BILIRUBIN UA: NEGATIVE
GLUCOSE UA: NEGATIVE
KETONES UA: NEGATIVE
Nitrite, UA: NEGATIVE
PH UA: 6 (ref 5.0–8.0)
PROTEIN UA: NEGATIVE
Spec Grav, UA: 1.015 (ref 1.010–1.025)
Urobilinogen, UA: 0.2 E.U./dL

## 2018-06-16 MED ORDER — CHOLECALCIFEROL 1.25 MG (50000 UT) PO CAPS: 50000.0000 [IU] | ORAL_CAPSULE | ORAL | 0 refills | Status: DC

## 2018-06-16 NOTE — Assessment & Plan Note (Signed)
Undetermined cause.  Work-up thus far has been unremarkable.  No CHF symptoms.  We will check urine for protein to see if that could be contributing.  Advised to elevate her legs.  Potentially could consider an echo versus compression stockings.

## 2018-06-16 NOTE — Progress Notes (Signed)
Kim AlarEric [place], MD Phone: [phone]  Kim Garcia is a 32 y.o. female who presents today for f/u.  CC: leg swelling, iron deficiency, vitamin d deficiency  Patient seen previously for lower extremity edema.  She had a fairly extensive work-up with negative ultrasound for DVT.  No obvious cause found.  No orthopnea or PND.  No shortness of breath.  She was prescribed Lasix which was beneficial.  She does note some kidney issues in her family.  She was found to have a low iron saturation though ferritin was in the normal range and she is not anemic.  She notes no blood in her stool.  She did start on iron supplementation.  She notes recently starting back with menses after breast-feeding.  She has had 3 cycles.  She bleeds for 7 days.  She changes a large pad every 3-4 hours.  Menses once monthly.  Found to be vitamin D deficient.  She took it for about a month.  She has not refilled this.  Social History   Tobacco Use  Smoking Status Never Smoker  Smokeless Tobacco Never Used     ROS see history of present illness  Objective  Physical Exam Vitals:   06/16/18 1605  BP: 116/78  Pulse: 91  Resp: 16  Temp: 98.2 F (36.8 C)  SpO2: 98%    BP Readings from Last 3 Encounters:  06/16/18 116/78  06/14/18 90/60  05/19/18 136/88   Wt Readings from Last 3 Encounters:  06/16/18 267 lb 9.6 oz (121.4 kg)  05/19/18 268 lb 3.2 oz (121.7 kg)  03/09/18 253 lb 3.2 oz (114.9 kg)    Physical Exam  Constitutional: No distress.  Cardiovascular: Normal rate, regular rhythm and normal heart sounds.  Pulmonary/Chest: Effort normal and breath sounds normal.  Musculoskeletal: She exhibits edema (Trace pitting bilateral lower extremities).  Neurological: She is alert.  Skin: Skin is warm and dry. She is not diaphoretic.     Assessment/Plan: Please see individual problem list.  Bilateral leg edema Undetermined cause.  Work-up thus far has been unremarkable.  No CHF symptoms.  We will  check urine for protein to see if that could be contributing.  Advised to elevate her legs.  Potentially could consider an echo versus compression stockings.  Vitamin D deficiency She will continue vitamin D for another month and then recheck labs.  Iron deficiency Potentially borderline for iron deficiency.  Her ferritin is in acceptable range and she is not anemic.  She will continue iron supplementation.  We will recheck labs in 1 month.  She will complete stool cards.   Orders Placed This Encounter  Procedures  . Fecal occult blood, imunochemical    Standing Status:   Future    Standing Expiration Date:   06/17/2019  . Urine Culture  . Vitamin D (25 hydroxy)    Standing Status:   Future    Standing Expiration Date:   06/17/2019  . CBC    Standing Status:   Future    Standing Expiration Date:   06/17/2019  . Iron, TIBC and Ferritin Panel    Standing Status:   Future    Standing Expiration Date:   06/17/2019  . Urine Microscopic Only  . POCT Urinalysis Dipstick    Meds ordered this encounter  Medications  . Cholecalciferol 50000 units capsule    Sig: Take 1 capsule (50,000 Units total) by mouth once a week.    Dispense:  4 capsule    Refill:  0  D3. Generic ok     Kim Alar, MD [facility]

## 2018-06-16 NOTE — Assessment & Plan Note (Signed)
Potentially borderline for iron deficiency.  Her ferritin is in acceptable range and she is not anemic.  She will continue iron supplementation.  We will recheck labs in 1 month.  She will complete stool cards.

## 2018-06-16 NOTE — Patient Instructions (Signed)
Nice to see you. We will check your urine.  We will have you return in 1 month for lab work.  Please continue the vitamin D and iron supplements. Please elevate your legs as much as possible.  Please monitor your swelling.

## 2018-06-16 NOTE — Assessment & Plan Note (Signed)
She will continue vitamin D for another month and then recheck labs.

## 2018-06-18 LAB — URINALYSIS, MICROSCOPIC ONLY: Hyaline Cast: NONE SEEN /LPF

## 2018-06-18 LAB — URINE CULTURE
MICRO NUMBER:: 90799231
SPECIMEN QUALITY:: ADEQUATE

## 2018-06-20 ENCOUNTER — Other Ambulatory Visit: Payer: Self-pay | Admitting: Family Medicine

## 2018-06-20 MED ORDER — CEPHALEXIN 500 MG PO CAPS: 500.0000 mg | ORAL_CAPSULE | Freq: Two times a day (BID) | ORAL | 0 refills | Status: DC

## 2018-06-22 ENCOUNTER — Other Ambulatory Visit (INDEPENDENT_AMBULATORY_CARE_PROVIDER_SITE_OTHER): Payer: No Typology Code available for payment source

## 2018-06-22 DIAGNOSIS — E611 Iron deficiency: Secondary | ICD-10-CM

## 2018-06-22 LAB — FECAL OCCULT BLOOD, IMMUNOCHEMICAL: FECAL OCCULT BLD: NEGATIVE

## 2018-06-28 ENCOUNTER — Encounter: Payer: No Typology Code available for payment source | Attending: Family Medicine | Admitting: Dietician

## 2018-06-28 ENCOUNTER — Encounter: Payer: Self-pay | Admitting: Dietician

## 2018-06-28 VITALS — Ht 62.0 in | Wt 267.9 lb

## 2018-06-28 DIAGNOSIS — Z6841 Body Mass Index (BMI) 40.0 and over, adult: Secondary | ICD-10-CM

## 2018-06-28 DIAGNOSIS — Z713 Dietary counseling and surveillance: Secondary | ICD-10-CM | POA: Insufficient documentation

## 2018-06-28 NOTE — Progress Notes (Signed)
Medical Nutrition Therapy: Visit start time: 8546  end time: 1645 Assessment:  Diagnosis: obesity Past medical history: Migraines, Iron deficiency, Vitamin D deficiency Psychosocial issues/ stress concerns: h/o anxiety and depression, no current sx reported  Preferred learning method:  . Auditory . Visual  Current weight: 267.9#  Height: _0  Medications, supplements: Iron, Vitamin D3, Imitrex, see chart  Progress and evaluation: Patient's goals for this program are to lose weight and eat healthier. She reports wt gain of approx. 80# over the past 10 years after giving birth to her children. States she developed sleep apnea which went untreated for at least a year, and also stopped her regular exercise protocol d/t lack of energy and time. She is aware that she needs to be more active and plans to do so now that her energy is returning with implementation of her c-pap 1 month ago. She is also starting to feel energized enough to complete all household tasks including cooking, which prior to c-pap were challenging. Currently she is struggling with getting into the habit of meal planning ahead of time. She has purchased an instant pot which she feels may help in this area. She also c/o inability to feel satisfied at meal times, and taking longer to fill up than she thinks she should. Recently she has started to make changes to her diet, including reducing the number of soft drinks she consumes daily, using the grill to cook, eating more fruit, eating less red meat, choosing whole grain bread, not frying foods, and trying to break the habit she has with her children of having a sweet after dinner each night. She would like to incorporate more vegetables into meals but states he husband is picky with vegetables, and her children can be at times too.   Physical activity: none at this time but is ready to restart cardiovascular physical activity protocol using treadmill in her home  Dietary Intake:   Usual eating pattern includes 2-3 meals and 1 snacks per day. Dining out frequency: 2-3 meals per week (fast food & restaurants).  Breakfast: not always; egg muffin cups, bagel Snack: none Lunch: cafeteria food at work: sandwich, burger, hot dog, wrap, chips, fries, vegetables, potatoes; at home: sandwich, leftovers, pimento cheese & crackers Snack: skinny pop popcorn with her kids Supper: if out: [place] (la fiesta), [place] food, Chick fil a. If home: meat/ seafood sometimes, broccoli, green beans, fried rice with peas & carrots, spinach, fruit, rice, mac n' cheese Snack: not usually, may have a sweet like candy Beverages: water, milk, bottled green tea, soda, sweet tea  Nutrition Care Education: Topics covered: sources of healthy fats, role of fiber in fullness and satiety, plate method, proper portion sizes and portion control, meal time strategies to increase satiety and fullness, ways to incorporate vegetables into family meals, quick meal preparation strategies, liquid calories / sugar sweetened beverages Basic nutrition: basic food groups, appropriate nutrient balance, appropriate meal and snack schedule, general nutrition guidelines    Weight control: benefits of weight control, behavioral changes for weight loss Advanced nutrition: recipe modification, cooking techniques, dining out Other lifestyle changes: benefits of making changes, increasing motivation, readiness for change, identifying habits that need to change  Nutritional Diagnosis:  [place]-3.3 Overweight/obesity As related to lifestyle habits.  As evidenced by BMI 49.00, patient report of sleep apnea affecting energy levels/ ability to exercise.  Intervention: Discussion as noted above. She would first like to work on becoming more physically active by re-starting a regular exercise program, and  also eliminating non-nutritious calories coming from beverages such as sweet tea and sodas. Future goals as listed below  include working to reduce portion sizes and consume more vegetables at meal times, finding quick and easy make-ahead recipes that reduce the temptation to eat out, and pre-planning meals for the week. Next time strategies for eating healthier when eating out and ideas for healthy recipes at home will be discussed.  Education Materials given: Marland Kitchen Goals/ instructions  Learner/ who was taught:  . Patient   Level of understanding: Marland Kitchen Verbalizes/ demonstrates competency  Demonstrated degree of understanding via:   Teach back Learning barriers: . None  Willingness to learn/ readiness for change: . Acceptance, ready for change  Monitoring and Evaluation:  Dietary intake, exercise, and body weight      follow up: in 2 week(s) for second of three appointments

## 2018-06-28 NOTE — Patient Instructions (Addendum)
   Think about where a regular exercise routine would fit into your week, then work on making it a habit, the same that going to work or making food for your kids would be. Start with an initial goal of working out 1-2 days per week, and increase # of days/ duration of each session from there. An ultimate goal would be 3-5 days/ week for at least 30-4245min  Look for ways to incorporate vegetables into more meals, for example blending vegetables into sauces or mincing them into meatloaf/ burgers. The frozen section is a great place to get some additional information  Cooking Light, Eating Well are two good recipe hubs. Use these sites to get recipe inspiration (look for quick / staple recipes)  Planning is key! (Both for meals and exercise). Make large batches of meats/ veggies/ grains to have leftovers for multiple days, or have a handful of easy throw-together meals on deck that you can make when in a time-crunch. The instant pot may be a good resource for this  At meal times, make half your plate vegetables/fruits, 1/4 plate lean protein, 1/4 plate starch. For portion guides 'starting points': protein 3-4oz, starch 1/3-1/2 cup, veggies 1/2-1 cup, fats 1oz/ 2Tbs. Remember, protein, fiber, and small servings of healthy fats help you feel and stay full  Decrease/ eliminate liquid calories/ sugar sweetened beverages like sweet tea and sodas  Consider tracking calories for a short period of time to bring awareness to the types of foods and portions of foods you're eating  Apps: MyFitnessPal, Calorie Brooke DareKing, MyMacros, [employer] Tracker

## 2018-07-06 ENCOUNTER — Other Ambulatory Visit: Payer: Self-pay

## 2018-07-12 ENCOUNTER — Encounter: Payer: Self-pay | Admitting: Dietician

## 2018-07-12 ENCOUNTER — Encounter: Payer: No Typology Code available for payment source | Admitting: Dietician

## 2018-07-12 VITALS — Wt 268.5 lb

## 2018-07-12 DIAGNOSIS — Z6841 Body Mass Index (BMI) 40.0 and over, adult: Principal | ICD-10-CM

## 2018-07-12 NOTE — Progress Notes (Signed)
Medical Nutrition Therapy: Visit start time: 1545  end time: 1615 Assessment:  Diagnosis: Obesity Medical history changes: None Psychosocial issues/ stress concerns: none  Current weight: 268.5#  Height: 5\' 2"  Medications, supplement changes: None  Progress and evaluation: Patient reports being more aware of her food and beverage choices overall, particularly in decreasing soda consumption. She has not started exercising yet d/t an unusually busy week, which also resulted in the family eating out more than normal. They have however planned to start using [employer] grocery delivery service starting next week, which she has started to look up recipes and plan meals for in order to add ingredients to the online shopping list. She plans to make one meal with whole grain noodles to trial with her kids. When eating out this week she made more nutrient-dense food choices, including opting for a side salad over french fries, ordering a grilled chicken sandwich rather than a hamburger, and splitting a taco meal with her mother (ate 2 rather than 4 tacos). She did report to have more energy and felt that she was more active despite not starting a structured exercise program. Knows that when two of her children go back to school that she will have more time for exercise during the day. At work she has most consistently improved her food choices; at home she struggles more with temptations from her family's food and beverage choices IE her husband still makes sweet tea which she cannot turn down (though no longer pours a second glass). She is motivated to make lifestyle changes to be an example to her children and to fit into her favorite clothes.    Physical activity: has not started her physical activity regimen but does report to have been more active around the house  Dietary Intake:  Usual eating pattern includes 2-3 meals and 1 snacks per day. Dining out frequency: increased this past week d/t children in  Summer camps, but plans to decrease  Meals at home: Meals out: Beverages: water, less sweet tea and soda  Nutrition Care Education: Topics covered: portion control, emotional eating, intermittent fasting, pre-planning meals, time-saving food preparation methods, ways to make more nutritious food choices when eating out Basic nutrition: basic food groups, appropriate nutrient balance, appropriate meal and snack schedule, general nutrition guidelines    Weight control: benefits of weight control, behavioral changes for weight loss Advanced nutrition: cooking techniques, dining out Other lifestyle changes: benefits of making changes, increasing motivation, readiness for change, identifying habits that need to change  Nutritional Diagnosis:  [place]-3.3 Overweight/obesity As related to previous lifestyle habits.  As evidenced by BMI 49.11.  Intervention: Discussion as noted above. She will continue to think about how a regular exercise routine will fit into her weekly schedule, and then implement within the next two weeks. She will use [employer] to have groceries delivered to her home, which eliminates food temptations and saves her time during the day; this also encourages her to pre-plan meals for the week which will result in the family going out to eat less often. Also, she will continue to decrease consumption of sugar sweetened beverages and to make healthy food swaps when eating out, along with working to identify any possible triggers which stimulate her to overeat.  Education Materials given:  . Recipes for quick, make ahead meals . Tips for eating out . Goals/ instructions  Learner/ who was taught:  . Patient   Level of understanding: Marland Kitchen. Verbalizes/ demonstrates competency  Demonstrated degree of understanding via:  Teach back Learning barriers: . None  Willingness to learn/ readiness for change: . Eager, change in progress  Monitoring and Evaluation:  Dietary intake, exercise,  frequency of eating out, and body weight      follow up: in 2 week(s): August 14

## 2018-07-12 NOTE — Patient Instructions (Addendum)
   Continue to think about where exercise can fit into your daily routine. Remember, you can break up the time during the day too

## 2018-07-15 ENCOUNTER — Other Ambulatory Visit: Payer: Self-pay | Admitting: Family Medicine

## 2018-07-17 ENCOUNTER — Other Ambulatory Visit: Payer: Self-pay

## 2018-07-19 ENCOUNTER — Telehealth: Payer: Self-pay

## 2018-07-19 NOTE — Telephone Encounter (Signed)
Called and left voicemail for patient to call the office to clarify if she is taking Furosemide 20mg  , because we received a med refill request for this medication. [facility] to get information.

## 2018-07-21 ENCOUNTER — Ambulatory Visit: Payer: Self-pay

## 2018-07-21 NOTE — Telephone Encounter (Signed)
Patient called in with c/o "rash." She says "I woke up yesterday morning with a feeling on my left shoulder and I saw it was a rash. The rash is circular, the size of a silver dollar, and it has several small red clusters of bumps in the middle of it. It's almost like the starting of blisters. The rash itches and it is very painful all through my shoulder at times. I did an e-vist and was prescribed a topical cream, which I have used and it doesn't seem to help at this point." I asked about other symptoms, she denies. According to protocol, see PCP within 24 hours, no availability with PCP. I offered the patient to come to the [facility] tomorrow to be seen by a provider, she agrees, appointment scheduled for tomorrow at 1100 with Dr. Mardelle MatteAndy, care advice given, patient verbalized understanding.  Reason for Disposition . [1] Localized rash is very painful AND [2] no fever  Answer Assessment - Initial Assessment Questions 1. APPEARANCE of RASH: "Describe the rash."      Circular rash and within are several cluster of bumps 2. LOCATION: "Where is the rash located?"      Left shoulder blade 3. NUMBER: "How many spots are there?"      1 spot 4. SIZE: "How big are the spots?" (Inches, centimeters or compare to size of a coin)      Silver dollar size 5. ONSET: "When did the rash start?"      Yesterday morning 6. ITCHING: "Does the rash itch?" If so, ask: "How bad is the itch?"  (Scale 1-10; or mild, moderate, severe)     Yes, moderate 7. PAIN: "Does the rash hurt?" If so, ask: "How bad is the pain?"  (Scale 1-10; or mild, moderate, severe)     Most day dull, tingling; several intense episodes that take my breath 8. OTHER SYMPTOMS: "Do you have any other symptoms?" (e.g., fever)     No 9. PREGNANCY: "Is there any chance you are pregnant?" "When was your last menstrual period?"     No  Protocols used: RASH OR REDNESS - LOCALIZED-A-AH

## 2018-07-22 ENCOUNTER — Ambulatory Visit (INDEPENDENT_AMBULATORY_CARE_PROVIDER_SITE_OTHER): Payer: No Typology Code available for payment source | Admitting: Family Medicine

## 2018-07-22 ENCOUNTER — Encounter: Payer: Self-pay | Admitting: Family Medicine

## 2018-07-22 VITALS — BP 110/72 | Temp 97.8°F | Resp 14 | Ht 62.0 in | Wt 267.0 lb

## 2018-07-22 DIAGNOSIS — B029 Zoster without complications: Secondary | ICD-10-CM

## 2018-07-22 MED ORDER — TRAMADOL HCL 50 MG PO TABS: 50.0000 mg | ORAL_TABLET | Freq: Three times a day (TID) | ORAL | 0 refills | Status: DC | PRN

## 2018-07-22 MED ORDER — VALACYCLOVIR HCL 1 G PO TABS: 1000.0000 mg | ORAL_TABLET | Freq: Three times a day (TID) | ORAL | 0 refills | Status: DC

## 2018-07-22 NOTE — Progress Notes (Signed)
Subjective  CC:  Chief Complaint  Patient presents with  . Shoulder Pain    rash and soreness; tingling and burining through shoulder blade; itchy as well    [facility]uated [facility].  See triage note. I have reviewed chart HPI: Kim Garcia is a 32 y.o. female who presents to the office today to address the problems listed above in the chief complaint.  2-3 days of pain in upper left back; now with blistering rash. No systemic sxs. Taking advil tid w/o relief. Burning pain. Has had chicken pox.    Assessment  1. Herpes zoster without complication      Plan   shingles:  Education and treatment plan reviewed. Valtrex tid x 7 days, pain control with nsaids and ultram if needed. Capsaicin cream. Should do well.   Follow up: f/u with pcp if sxs persist or worsen  No orders of the defined types were placed in this encounter.  Meds ordered this encounter  Medications  . valACYclovir (VALTREX) 1000 MG tablet    Sig: Take 1 tablet (1,000 mg total) by mouth 3 (three) times daily.    Dispense:  21 tablet    Refill:  0  . traMADol (ULTRAM) 50 MG tablet    Sig: Take 1 tablet (50 mg total) by mouth every 8 (eight) hours as needed.    Dispense:  30 tablet    Refill:  0      I reviewed the patients updated PMH, FH, and SocHx.    Patient Active Problem List   Diagnosis Date Noted  . Iron deficiency 06/16/2018  . Vitamin D deficiency 06/16/2018  . Bilateral leg edema 05/19/2018  . Frequent urination 03/09/2018  . Irregular menstrual cycle 03/09/2018  . Respiratory infection 03/09/2018  . Encounter for general adult medical examination with abnormal findings 02/02/2018  . Hypersomnia 02/02/2018  . Morbid obesity (HCC) 02/02/2018  . Elevated blood pressure reading 02/02/2018  . Migraine 08/19/2017  . Anxiety and depression 04/01/2016  . Plantar fasciitis 04/01/2016   Current Meds  Medication Sig  . cephALEXin (KEFLEX) 500 MG capsule Take 1 capsule  (500 mg total) by mouth 2 (two) times daily.  . cetirizine (ZYRTEC) 10 MG tablet Take 10 mg by mouth daily as needed for allergies.  . Cholecalciferol 50000 units capsule Take 1 capsule (50,000 Units total) by mouth once a week.  . ferrous sulfate 325 (65 FE) MG EC tablet Take 325 mg by mouth 3 (three) times daily with meals.  Marland Kitchen. ibuprofen (ADVIL,MOTRIN) 600 MG tablet Take 1 tablet (600 mg total) by mouth every 6 (six) hours.  Marland Kitchen. PROAIR HFA 108 (90 Base) MCG/ACT inhaler INHALE 1 PUFF INTO THE LUNGS EVERY 4 (FOUR) HOURS AS NEEDED FOR WHEEZING OR SHORTNESS OF BREATH.  . SUMAtriptan (IMITREX) 50 MG tablet Take 1 tablet (50 mg total) by mouth every 2 (two) hours as needed for migraine. May repeat in 2 hours if headache persists or recurs.  . [DISCONTINUED] furosemide (LASIX) 20 MG tablet Take 1 tablet (20 mg total) by mouth daily as needed.    Allergies: Patient is allergic to tape. Family History: Patient family history includes Alcoholism in her unknown relative; Colon cancer in her maternal grandmother; Diabetes in her maternal grandmother and paternal grandmother; Stroke in her paternal grandmother. Social History:  Patient  reports that she has never smoked. She has never used smokeless tobacco. She reports that she does not drink alcohol or use drugs.  Review of Systems: Constitutional: Negative for fever malaise or anorexia Cardiovascular: negative for chest pain Respiratory: negative for SOB or persistent cough Gastrointestinal: negative for abdominal pain  Objective  Vitals: BP 110/72 (BP Location: Left Arm, Patient Position: Sitting, Cuff Size: Large)   Temp 97.8 F (36.6 C) (Oral)   Resp 14   Ht 5\' 2"  (1.575 m)   Wt 267 lb (121.1 kg)   SpO2 98%   BMI 48.83 kg/m  General: no acute distress , A&Ox3 HEENT: PEERL, conjunctiva normal, Oropharynx moist,neck is supple Cardiovascular:  RRR without murmur or gallop.  Respiratory:  Good breath sounds bilaterally, CTAB with normal  respiratory effort Skin:  Warm, left upper back left of spine with annular rash, erythematous base with patch of blisters. No scabs or crusting. No other rash.      Commons side effects, risks, benefits, and alternatives for medications and treatment plan prescribed today were discussed, and the patient expressed understanding of the given instructions. Patient is instructed to call or message via MyChart if he/she has any questions or concerns regarding our treatment plan. No barriers to understanding were identified. We discussed Red Flag symptoms and signs in detail. Patient expressed understanding regarding what to do in case of urgent or emergency type symptoms.   Medication list was reconciled, printed and provided to the patient in AVS. Patient instructions and summary information was reviewed with the patient as documented in the AVS. This note was prepared with assistance of Dragon voice recognition software. Occasional wrong-word or sound-a-like substitutions may have occurred due to the inherent limitations of voice recognition software

## 2018-07-22 NOTE — Patient Instructions (Signed)
Please follow up if symptoms do not improve or as needed.   You may use the tramadol with the advil as needed for pain. Capsaicin cream can be helpful as well.   Valtrex for 7 days; please start today.   Shingles Shingles, which is also known as herpes zoster, is an infection that causes a painful skin rash and fluid-filled blisters. Shingles is not related to genital herpes, which is a sexually transmitted infection. Shingles only develops in people who:  Have had chickenpox.  Have received the chickenpox vaccine. (This is rare.)  What are the causes? Shingles is caused by varicella-zoster virus (VZV). This is the same virus that causes chickenpox. After exposure to VZV, the virus stays in the body in an inactive (dormant) state. Shingles develops if the virus reactivates. This can happen many years after the initial exposure to VZV. It is not known what causes this virus to reactivate. What increases the risk? People who have had chickenpox or received the chickenpox vaccine are at risk for shingles. Infection is more common in people who:  Are older than age 27.  Have a weakened defense (immune) system, such as those with HIV, AIDS, or cancer.  Are taking medicines that weaken the immune system, such as transplant medicines.  Are under great stress.  What are the signs or symptoms? Early symptoms of this condition include itching, tingling, and pain in an area on your skin. Pain may be described as burning, stabbing, or throbbing. A few days or weeks after symptoms start, a painful red rash appears, usually on one side of the body in a bandlike or beltlike pattern. The rash eventually turns into fluid-filled blisters that break open, scab over, and dry up in about 2-3 weeks. At any time during the infection, you may also develop:  A fever.  Chills.  A headache.  An upset stomach.  How is this diagnosed? This condition is diagnosed with a skin exam. Sometimes, skin or  fluid samples are taken from the blisters before a diagnosis is made. These samples are examined under a microscope or sent to a lab for testing. How is this treated? There is no specific cure for this condition. Your health care provider will probably prescribe medicines to help you manage pain, recover more quickly, and avoid long-term problems. Medicines may include:  Antiviral drugs.  Anti-inflammatory drugs.  Pain medicines.  If the area involved is on your face, you may be referred to a specialist, such as an eye doctor (ophthalmologist) or an ear, nose, and throat (ENT) doctor to help you avoid eye problems, chronic pain, or disability. Follow these instructions at home: Medicines  Take medicines only as directed by your health care provider.  Apply an anti-itch or numbing cream to the affected area as directed by your health care provider. Blister and Rash Care  Take a cool bath or apply cool compresses to the area of the rash or blisters as directed by your health care provider. This may help with pain and itching.  Keep your rash covered with a loose bandage (dressing). Wear loose-fitting clothing to help ease the pain of material rubbing against the rash.  Keep your rash and blisters clean with mild soap and cool water or as directed by your health care provider.  Check your rash every day for signs of infection. These include redness, swelling, and pain that lasts or increases.  Do not pick your blisters.  Do not scratch your rash. General instructions  Rest  as directed by your health care provider.  Keep all follow-up visits as directed by your health care provider. This is important.  Until your blisters scab over, your infection can cause chickenpox in people who have never had it or been vaccinated against it. To prevent this from happening, avoid contact with other people, especially: ? Babies. ? Pregnant women. ? Children who have eczema. ? Elderly people who  have transplants. ? People who have chronic illnesses, such as leukemia or AIDS. Contact a health care provider if:  Your pain is not relieved with prescribed medicines.  Your pain does not get better after the rash heals.  Your rash looks infected. Signs of infection include redness, swelling, and pain that lasts or increases. Get help right away if:  The rash is on your face or nose.  You have facial pain, pain around your eye area, or loss of feeling on one side of your face.  You have ear pain or you have ringing in your ear.  You have loss of taste.  Your condition gets worse. This information is not intended to replace advice given to you by your health care provider. Make sure you discuss any questions you have with your health care provider. Document Released: 11/29/2005 Document Revised: 07/25/2016 Document Reviewed: 10/10/2014 Elsevier Interactive Patient Education  2018 [employer]

## 2018-07-26 ENCOUNTER — Encounter: Payer: Self-pay | Admitting: Dietician

## 2018-07-26 ENCOUNTER — Encounter: Payer: No Typology Code available for payment source | Attending: Family Medicine | Admitting: Dietician

## 2018-07-26 VITALS — Wt 265.3 lb

## 2018-07-26 DIAGNOSIS — Z6841 Body Mass Index (BMI) 40.0 and over, adult: Secondary | ICD-10-CM

## 2018-07-26 DIAGNOSIS — Z713 Dietary counseling and surveillance: Secondary | ICD-10-CM | POA: Insufficient documentation

## 2018-07-26 NOTE — Progress Notes (Signed)
Medical Nutrition Therapy: Visit start time: 1545  end time: 1615  Assessment:  Diagnosis: obesity Medical history changes: shingles dx, treatment ongoing Psychosocial issues/ stress concerns: pt reports recent increased stress / worry d/t life and family events  Current weight: 265.3#  Height: 5'2" Medications, supplement changes: Tramadol prn for shingles, see chart  Progress and evaluation: Since last session she has continued to implement changes to diet and lifestyle habits. Examples include trying whole grain bread and pasta (well received by both children and husband), incorporating more fruits and vegetables daily IE offering her children raw veggies as an 'appetizer' before dinner which she has been eating too, continuing to order groceries using the Insta-cart program, being more conscious of portion sizes at meal times, and planning meals more and thus eating out less (instant pot has been helpful for this), working on her emotional relationship with food IE thinking about what foods she can add to a meal rather than thinking she 'can't have' certain foods. Reports continued increase in energy overall. Areas she is still struggling with include figuring out appropriate portions of recipes to make for the family, prepping fruits and vegetables for easy grab-and-go availability, replacing staple pantry ingredients so that her ability to make certain recipes isn't hindered by not having one or two "staple" ingredients, being more consistent with packing lunches, and not buying certain foods that are tempting for her to eat like sweets and certain snack foods. States she is motivated to continue to make changes for herself, her self-esteem and her children.   Physical activity: Structured program not yet in place but does report to have been more active day-to-day IE taking her kids to the park and walking. She has also discussed with a friend about walking during lunch breaks on work days and plans  to clean out her bedroom so she can use the treadmill.  Dietary Intake:  Usual eating pattern includes 3 meals and 1-2 snacks per day. Dining out frequency: 1 meals per week.  Breakfast: eating breakfast regularly now Snacks: more fruits and vegetables Lunch & Dinner: eating at home more and packing more lunches; [place]key wrap, fruit, Instant Pot shredded chicken, whole grain pasta Beverages: less sugar sweetened beverages  Nutrition Care Education: Topics covered: breakfast ideas for kids, making packing her lunch while packing childrens' lunches a habit, ways to increase fruit and vegetable intake, mindset around food and verbiage IE not labeling foods as off-limits and not using food as a coping mechanism for stress or worry, strategies for pre-preparing ingredients and weekly meals/ having leftovers Basic nutrition: basic food groups, appropriate nutrient balance, appropriate meal and snack schedule, general nutrition guidelines    Weight control: benefits of weight control, behavioral changes for weight loss Advanced nutrition: recipe modification, dining out Other lifestyle changes: benefits of making changes, identifying habits that need to change  Nutritional Diagnosis:  Dailey-3.3 Overweight/obesity As related to previous lifestyle habits.  As evidenced by BMI 48.52.  Intervention: Discussion as noted above. She plans to continue to work on the changes she has implemented, ideally adopting them as perminant lifestyle changes. Moving forward she will also work to incorporate a consistent structured exercise routine, having food prepared in advance for meals and snacks to encourage eating out less often, figuring out appropriate portions for her family which will still allow for leftover portions, emotional eating, and continuing to decrease intake of added sugars namely sugar sweetened beverages. The changes she has made up to this point are significant and she  has already started to see weight  loss as a result, which was acknowledged and praised for.  Education Materials given:  Marland Kitchen. Goals/ instructions  Learner/ who was taught:  . Patient  Level of understanding: Marland Kitchen. Verbalizes/ demonstrates competency  Demonstrated degree of understanding via:   Teach back Learning barriers: . None  Willingness to learn/ readiness for change: . Eager, change in progress  Monitoring and Evaluation:  Dietary intake, exercise, meal preparation, and body weight      follow up: prn: 3 of 3 visits completed; email and office phone number provided for questions or to book future appointments

## 2018-07-31 ENCOUNTER — Ambulatory Visit: Payer: Self-pay | Admitting: Family Medicine

## 2018-10-29 ENCOUNTER — Ambulatory Visit
Admission: EM | Admit: 2018-10-29 | Discharge: 2018-10-29 | Disposition: A | Discharge status: Home / Self Care | Payer: No Typology Code available for payment source | Attending: Family Medicine | Admitting: Family Medicine

## 2018-10-29 ENCOUNTER — Other Ambulatory Visit: Payer: Self-pay

## 2018-10-29 DIAGNOSIS — M25561 Pain in right knee: Secondary | ICD-10-CM | POA: Diagnosis not present

## 2018-10-29 HISTORY — DX: Sleep apnea, unspecified: G47.30

## 2018-10-29 MED ORDER — MELOXICAM 15 MG PO TABS: 15.0000 mg | ORAL_TABLET | Freq: Every day | ORAL | 0 refills | Status: DC | PRN

## 2018-10-29 NOTE — ED Provider Notes (Signed)
[facility]  CSN: [id number] Arrival date & time: 10/29/18  1355  History   Chief Complaint Chief Complaint  Patient presents with  . Knee Pain   HPI   32 year old female presents with the above complaint.  Patient reports a 2-week history of right knee pain.  Patient does not recall fall, trauma, injury.  She has been exercising recently which is new.  She has been going to a "boot camp".  Again she does a lot of squats as well as lunges.  Body weights as well as weights.  Patient reports knee swelling, pain.  Moderate to severe.  Worse with activity and bearing weight.  No relieving factors.  Pain is located diffusely.  No other complaints.  Past Medical History:  Diagnosis Date  . Anemia   . Asthma    as child, rarely uses inhaler  . Depression   . History of blood transfusion 05/2011   1 unit transfused at [facility]  . Missed ab 2014   no surgery required  . Seasonal allergies   . Sleep apnea   . SVD (spontaneous vaginal delivery) 05/2011   x 1    Patient Active Problem List   Diagnosis Date Noted  . Iron deficiency 06/16/2018  . Vitamin D deficiency 06/16/2018  . Bilateral leg edema 05/19/2018  . Frequent urination 03/09/2018  . Irregular menstrual cycle 03/09/2018  . Respiratory infection 03/09/2018  . Encounter for general adult medical examination with abnormal findings 02/02/2018  . Hypersomnia 02/02/2018  . Morbid obesity (HCC) 02/02/2018  . Elevated blood pressure reading 02/02/2018  . Migraine 08/19/2017  . Anxiety and depression 04/01/2016  . Plantar fasciitis 04/01/2016    Past Surgical History:  Procedure Laterality Date  . CESAREAN SECTION N/A 09/27/2014   Procedure: Primary CESAREAN SECTION;  Surgeon: Serita Kyle, MD;  Location: [facility];  Service: Obstetrics;  Laterality: N/A;  EDD: 10/04/14  . CESAREAN SECTION WITH BILATERAL TUBAL LIGATION N/A 02/25/2017   Procedure: REPEAT CESAREAN SECTION WITH BILATERAL TUBAL LIGATION;  Surgeon:  Maxie Better, MD;  Location: [facility];  Service: Obstetrics;  Laterality: N/A;  EDD: 02/28/17  . WISDOM TOOTH EXTRACTION      OB History    Gravida  4   Para  3   Term  3   Preterm      AB  1   Living  1     SAB  1   TAB      Ectopic      Multiple  0   Live Births  1            Home Medications    Prior to Admission medications   Medication Sig Start Date End Date Taking? Authorizing Provider  meloxicam (MOBIC) 15 MG tablet Take 1 tablet (15 mg total) by mouth daily as needed. 10/29/18   Hamlet Lasecki G, DO  PROAIR HFA 108 (90 Base) MCG/ACT inhaler INHALE 1 PUFF INTO THE LUNGS EVERY 4 (FOUR) HOURS AS NEEDED FOR WHEEZING OR SHORTNESS OF BREATH. 07/17/18   Glori Luis, MD  SUMAtriptan (IMITREX) 50 MG tablet Take 1 tablet (50 mg total) by mouth every 2 (two) hours as needed for migraine. May repeat in 2 hours if headache persists or recurs. 08/19/17   Tommie Sams, DO  valACYclovir (VALTREX) 1000 MG tablet Take 1 tablet (1,000 mg total) by mouth 3 (three) times daily. 07/22/18   Willow Ora, MD    Family History Family History  Problem Relation Age of Onset  . Alcoholism Unknown        Parent  . Colon cancer Maternal Grandmother   . Diabetes Maternal Grandmother   . Stroke Paternal Grandmother   . Diabetes Paternal Grandmother     Social History Social History   Tobacco Use  . Smoking status: Never Smoker  . Smokeless tobacco: Never Used  Substance Use Topics  . Alcohol use: No  . Drug use: No     Allergies   Tape   Review of Systems Review of Systems  Constitutional: Negative.   Musculoskeletal:       Right knee pain.   Physical Exam Triage Vital Signs ED Triage Vitals  Enc Vitals Group     BP 10/29/18 1423 (!) 135/96     Pulse Rate 10/29/18 1423 61     Resp 10/29/18 1423 16     Temp 10/29/18 1423 98.1 F (36.7 C)     Temp Source 10/29/18 1423 Oral     SpO2 10/29/18 1423 100 %     Weight 10/29/18 1422 250 lb  (113.4 kg)     Height 10/29/18 1422 5\' 2"  (1.575 m)     Head Circumference --      Peak Flow --      Pain Score 10/29/18 1422 4     Pain Loc --      Pain Edu? --      Excl. in GC? --    Updated Vital Signs BP (!) 135/96 (BP Location: Left Arm)   Pulse 61   Temp 98.1 F (36.7 C) (Oral)   Resp 16   Ht 5\' 2"  (1.575 m)   Wt 113.4 kg   LMP 10/02/2018   SpO2 100%   BMI 45.73 kg/m   Visual Acuity Right Eye Distance:   Left Eye Distance:   Bilateral Distance:    Right Eye Near:   Left Eye Near:    Bilateral Near:     Physical Exam  Constitutional: She is oriented to person, place, and time. She appears well-developed. No distress.  HENT:  Head: Normocephalic and atraumatic.  Pulmonary/Chest: Effort normal. No respiratory distress.  Musculoskeletal:  Right knee -exam extremely limited due to patient body habitus.  No appreciable areas of tenderness.  No joint line tenderness.  No apparent effusion although difficult to discern.  Patient appears to have normal passive range of motion of the knee.  Neurological: She is alert and oriented to person, place, and time.  Psychiatric: She has a normal mood and affect. Her behavior is normal.  Nursing note and vitals reviewed.  UC Treatments / Results  Labs (all labs ordered are listed, but only abnormal results are displayed) Labs Reviewed - No data to display  EKG None  Radiology No results found.  Procedures Procedures (including critical care time)  Medications Ordered in UC Medications - No data to display  Initial Impression / Assessment and Plan / UC Course  I have reviewed the triage vital signs and the nursing notes.  Pertinent labs & imaging results that were available during my care of the patient were reviewed by me and considered in my medical decision making (see chart for details).    32 year old female presents with right knee pain.  Appears to be from overuse.  Advised rest, ice, elevation.  Meloxicam  as directed. Needs weight loss.  Final Clinical Impressions(s) / UC Diagnoses   Final diagnoses:  Acute pain of right knee  Discharge Instructions     Rest, ice, elevate.  Medication as prescribed. No ibuprofen with the meloxicam.  Ease back in to exercise.  Take care  Dr. Adriana Simas    ED Prescriptions    Medication Sig Dispense Auth. Provider   meloxicam (MOBIC) 15 MG tablet Take 1 tablet (15 mg total) by mouth daily as needed. 30 tablet Tommie Sams, DO     Controlled Substance Prescriptions [place] Controlled Substance Registry consulted? Not Applicable   Tommie Sams, DO 10/29/18 1708

## 2018-10-29 NOTE — ED Triage Notes (Addendum)
Right knee pain x 2 weeks. Has been taking a boot camp and wonders if she injured it during the boot camp, but can't pinpoint when it would have happened.

## 2018-10-29 NOTE — Discharge Instructions (Signed)
Rest, ice, elevate.  Medication as prescribed. No ibuprofen with the meloxicam.  Ease back in to exercise.  Take care  Dr. Adriana Simasook

## 2018-11-23 ENCOUNTER — Encounter: Payer: Self-pay | Admitting: Family Medicine

## 2018-11-23 ENCOUNTER — Ambulatory Visit (INDEPENDENT_AMBULATORY_CARE_PROVIDER_SITE_OTHER): Payer: Managed Care, Other (non HMO) | Admitting: Family Medicine

## 2018-11-23 VITALS — BP 110/78 | HR 87 | Temp 98.3°F | Ht 62.0 in | Wt 257.8 lb

## 2018-11-23 DIAGNOSIS — M25561 Pain in right knee: Secondary | ICD-10-CM | POA: Diagnosis not present

## 2018-11-23 DIAGNOSIS — J019 Acute sinusitis, unspecified: Secondary | ICD-10-CM | POA: Diagnosis not present

## 2018-11-23 DIAGNOSIS — R350 Frequency of micturition: Secondary | ICD-10-CM | POA: Diagnosis not present

## 2018-11-23 LAB — POCT URINALYSIS DIPSTICK
Bilirubin, UA: NEGATIVE
Glucose, UA: NEGATIVE
Ketones, UA: NEGATIVE
Leukocytes, UA: NEGATIVE
NITRITE UA: NEGATIVE
Protein, UA: POSITIVE — AB
RBC UA: NEGATIVE
Spec Grav, UA: 1.025 (ref 1.010–1.025)
Urobilinogen, UA: 0.2 E.U./dL
pH, UA: 6 (ref 5.0–8.0)

## 2018-11-23 MED ORDER — AMOXICILLIN-POT CLAVULANATE 875-125 MG PO TABS
1.0000 | ORAL_TABLET | Freq: Two times a day (BID) | ORAL | 0 refills | Status: DC
Start: 2018-11-23 — End: 2018-11-27

## 2018-11-23 NOTE — Patient Instructions (Signed)

## 2018-11-23 NOTE — Progress Notes (Signed)
Subjective:    Patient ID: Kim Garcia, female    DOB: 06/05/1986, 32 y.o.   MRN: [id number]  HPI   Presents to clinic c/o possible sinus infection and UTI.  Patient has had sinus congestion, facial pressure, thick nasal drainage that has been building up and worsening over 3 weeks.  Patient does take a allergy medicine daily, unsure of exact name but it is an over-the-counter antihistamine.  Patient states usually an allergy medicine does help her congestion symptoms, but the drainage is only gotten worse.  She did add in Sudafed a few days ago, but pressure persists.  Patient also reports increased urinary frequency over the past week or so.  Patient denies any burning or pain with urination, just noticed that she urinates more and when she feels urge to urinate she has to find restroom quickly.  Patient also complains of right knee pain.  She was doing boot camp style exercise classes, and thinks she injured her knee during this.  She went to urgent care about 4 weeks ago and was prescribed anti-inflammatory Mobic to help.  Patient states the pain seems somewhat better, but will still ache from time to time.  Patient Active Problem List   Diagnosis Date Noted  . Iron deficiency 06/16/2018  . Vitamin D deficiency 06/16/2018  . Bilateral leg edema 05/19/2018  . Frequent urination 03/09/2018  . Irregular menstrual cycle 03/09/2018  . Respiratory infection 03/09/2018  . Encounter for general adult medical examination with abnormal findings 02/02/2018  . Hypersomnia 02/02/2018  . Morbid obesity (HCC) 02/02/2018  . Elevated blood pressure reading 02/02/2018  . Migraine 08/19/2017  . Anxiety and depression 04/01/2016  . Plantar fasciitis 04/01/2016   Social History   Tobacco Use  . Smoking status: Never Smoker  . Smokeless tobacco: Never Used  Substance Use Topics  . Alcohol use: No    Review of Systems  Constitutional: Negative for chills, fatigue and fever.  HENT:  +congestion, ear pain, sinus pain and sore throat.   Eyes: Negative.   Respiratory: Negative for cough, shortness of breath and wheezing.   Cardiovascular: Negative for chest pain, palpitations and leg swelling.  Gastrointestinal: Negative for abdominal pain, diarrhea, nausea and vomiting.  Genitourinary: +dysuria, frequency and urgency.  Musculoskeletal: +right knee pain Skin: Negative for color change, pallor and rash.  Neurological: Negative for syncope, light-headedness and headaches.  Psychiatric/Behavioral: The patient is not nervous/anxious.       Objective:   Physical Exam   Constitutional: She appears well-developed and well-nourished. No distress.  HENT:  Nose/throat: Positive postnasal drip.  Positive thick nasal discharge.  Positive maxillary and facial sinus tenderness.  Positive mucosal edema.  No exudate on tonsils, but redness in back of throat. Head: Normocephalic and atraumatic.  Eyes:EOM are normal. No scleral icterus.  Neck: Normal range of motion. Neck supple. No tracheal deviation present.  Cardiovascular: Normal rate, regular rhythm and normal heart sounds.  Pulmonary/Chest: Effort normal and breath sounds normal. No respiratory distress. She has no wheezes. She has no rales.  Abdominal: Soft. Bowel sounds are normal. There is no tenderness.  No suprapubic tenderness.  No CVA tenderness. Neurological: She is alert and oriented to person, place, and time.  Musculoskeletal: Gait normal.  Bilateral knees appear normal, no joint deformity.  No knee joint laxity.  Range of motion of knees normal, no pain at full extension or full flexion. Skin: Skin is warm and dry. No pallor.  Psychiatric: She has a normal mood  and affect. Her behavior is normal. Thought content normal.  Nursing note and vitals reviewed.     Vitals:   11/23/18 1010  BP: 110/78  Pulse: 87  Temp: 98.3 F (36.8 C)  SpO2: 97%   Wt Readings from Last 3 Encounters:  11/23/18 257 lb 12.8 oz (116.9  kg)  10/29/18 250 lb (113.4 kg)  07/26/18 265 lb 4.8 oz (120.3 kg)    Assessment & Plan:   Acute sinusitis- patient will take Augmentin twice daily for 10 days.  Advised to continue her daily over-the-counter allergy medication and also do saline nasal flushes to help wash out congestion.  Patient will rest, increase water intake and do good handwashing.  Urinary frequency/urgency- urinalysis is unremarkable for any UTI.  We will send for culture as well.  Patient advised that if urinary frequency symptoms persist over the next 1 to 2 weeks, we may need to do further urinary testing.  Right knee pain- suspect description of knee pain is related to a knee strain related to doing Style exercise classes.  Offered knee x-ray in clinic today, patient declines.  Patient given handout outlining knee stretching and strengthening exercises she can do to help build up muscles around knee joint.  Patient will continue to use Mobic if needed for knee pain.  Patient advised that if the knee pain continues to persist over the next few weeks, to let us know and we can discuss possible referral to physical therapy and/or do imaging at that time.  Keep regularly scheduled follow-up with PCP as planned.  Return to clinic sooner if any issues arise or if current symptoms persist or worsen

## 2018-11-25 LAB — URINE CULTURE
MICRO NUMBER:: 91489659
SPECIMEN QUALITY:: ADEQUATE

## 2018-11-27 ENCOUNTER — Telehealth: Payer: Self-pay | Admitting: Family Medicine

## 2018-11-27 DIAGNOSIS — J019 Acute sinusitis, unspecified: Secondary | ICD-10-CM

## 2018-11-27 MED ORDER — AMOXICILLIN-POT CLAVULANATE 875-125 MG PO TABS: 1.0000 | ORAL_TABLET | Freq: Two times a day (BID) | ORAL | 0 refills | Status: DC

## 2018-11-27 NOTE — Telephone Encounter (Signed)
Medication has been resent

## 2018-11-27 NOTE — Telephone Encounter (Signed)
Copied from CRM [id number]. Topic: Quick Communication - See Telephone Encounter >> Nov 27, 2018  9:02 AM Jens SomMedley, Jennifer A wrote: CRM for notification. See Telephone encounter for: 11/27/18.  Patient is calling because there the pharmacy did not receive the medication  request for amoxicillin-clavulanate (AUGMENTIN) 875-125 MG tablet [[id number]][[id number]] on 11/23/18. Can the script be resent? Please advise

## 2019-03-05 ENCOUNTER — Other Ambulatory Visit: Payer: Self-pay | Admitting: Family Medicine

## 2019-03-05 MED ORDER — ALBUTEROL SULFATE HFA 108 (90 BASE) MCG/ACT IN AERS: INHALATION_SPRAY | RESPIRATORY_TRACT | 0 refills | Status: DC

## 2019-04-26 ENCOUNTER — Other Ambulatory Visit: Payer: Self-pay | Admitting: Family Medicine

## 2019-04-27 ENCOUNTER — Telehealth: Payer: Self-pay

## 2019-04-27 NOTE — Telephone Encounter (Signed)
Patient is responding to a call she received regarding a refill.  Patient stated she does not need a refill at this time and will call when she does need a refill.

## 2019-04-27 NOTE — Telephone Encounter (Signed)
Noted  

## 2019-04-29 IMAGING — US US EXTREM LOW VENOUS BILAT
1 series · 13 of 24 positions shown · non-contrast
Comparison: None.

CLINICAL DATA: 31-year-old female with bilateral lower extremity
edema worse on the left than the right for the past 2 weeks



[Series 1: us extrem low venous bilat · 13 of 60 slices shown]
[im 1/60]
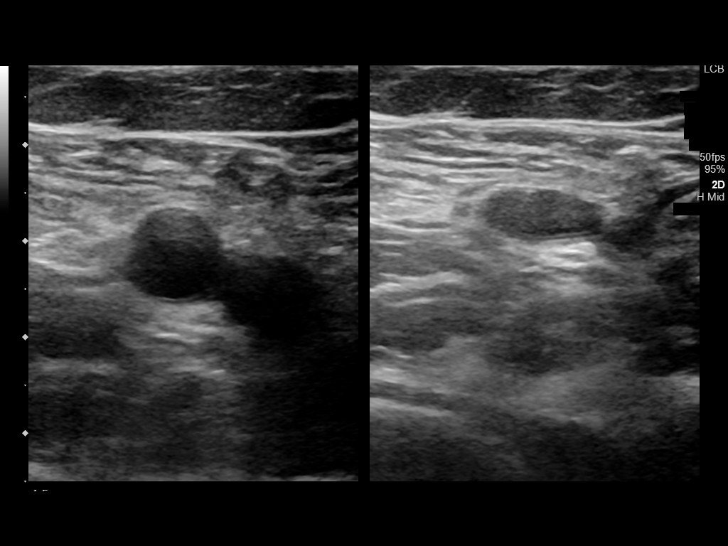
[im 6/60]
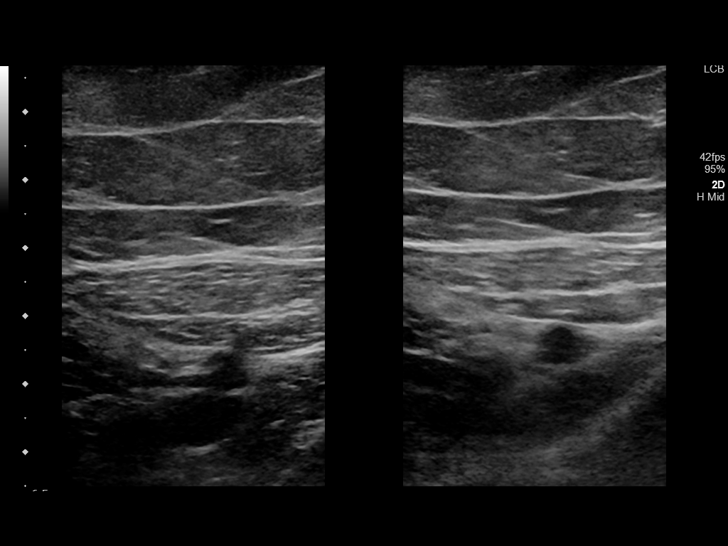
[im 11/60]
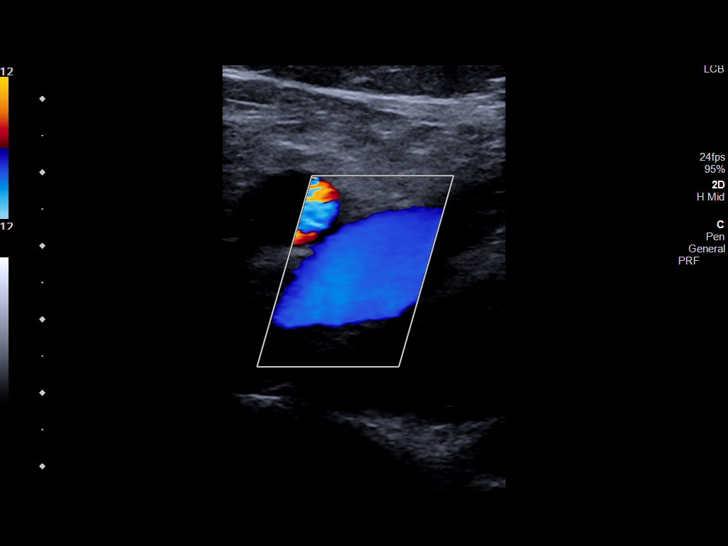
[im 16/60]
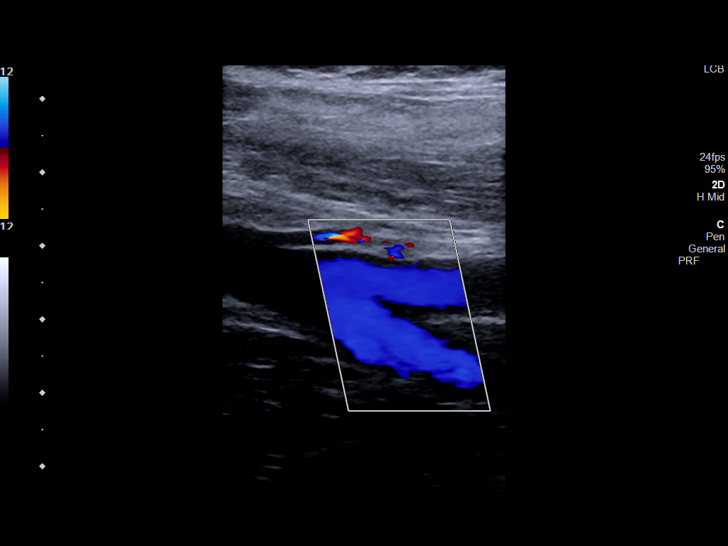
[im 21/60]
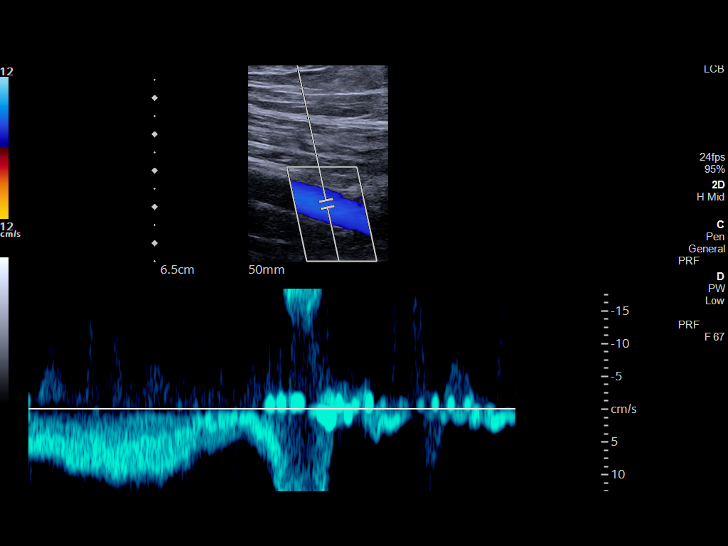
[im 26/60]
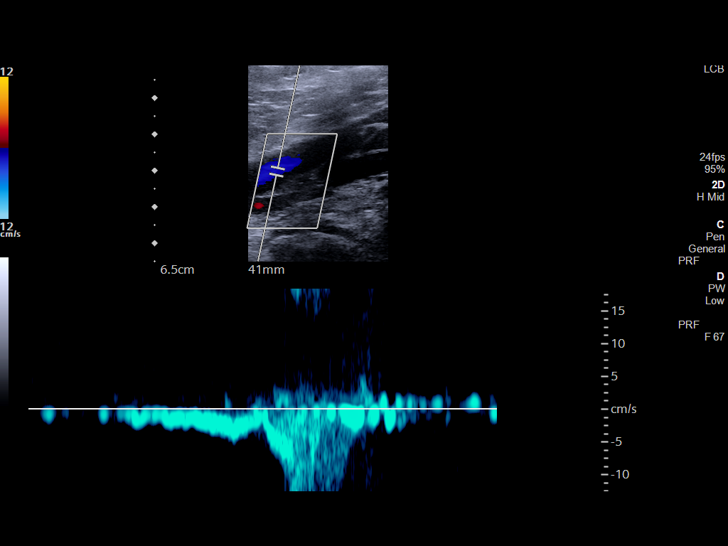
[im 31/60]
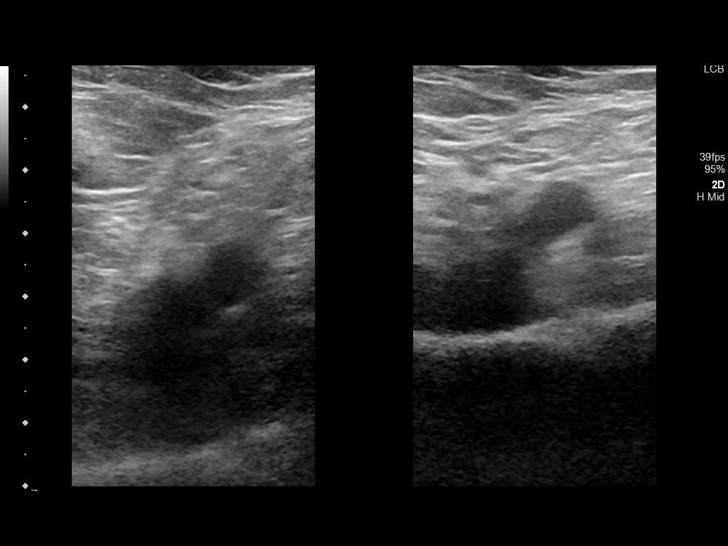
[im 34/60]
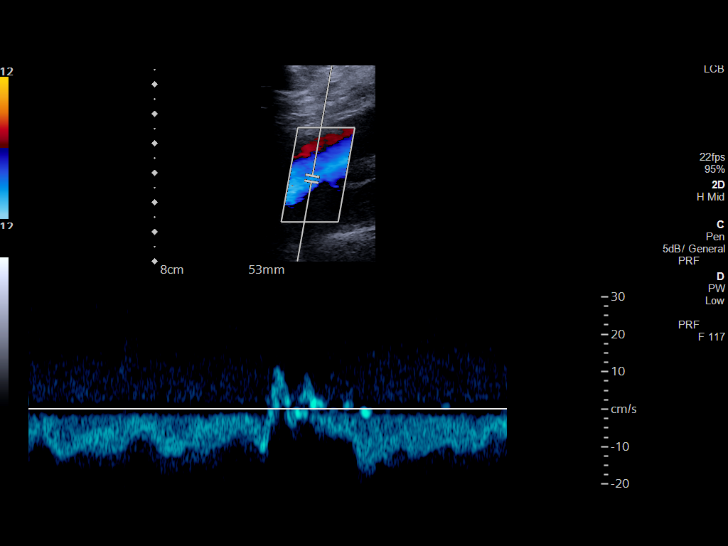
[im 39/60]
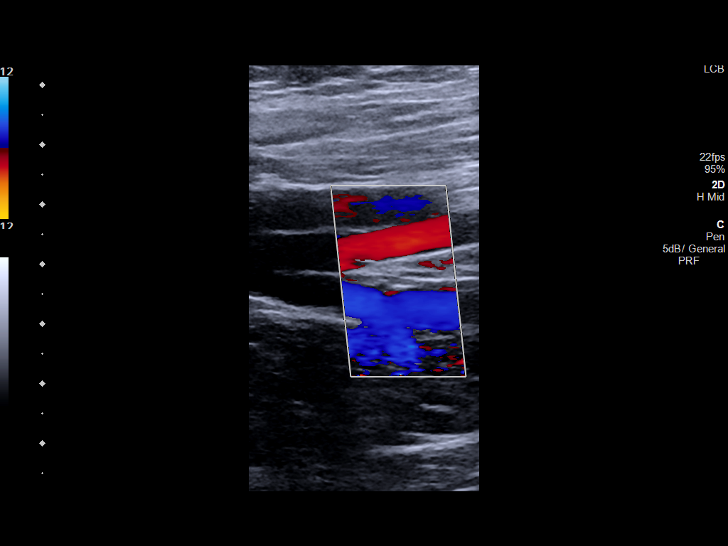
[im 44/60]
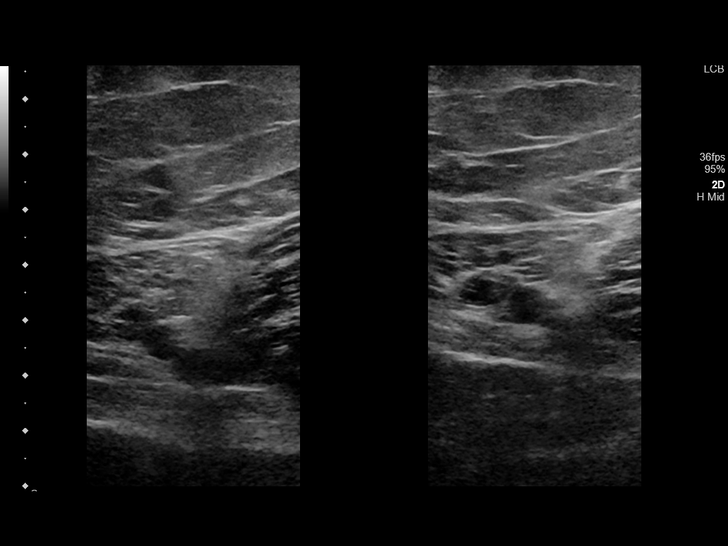
[im 49/60]
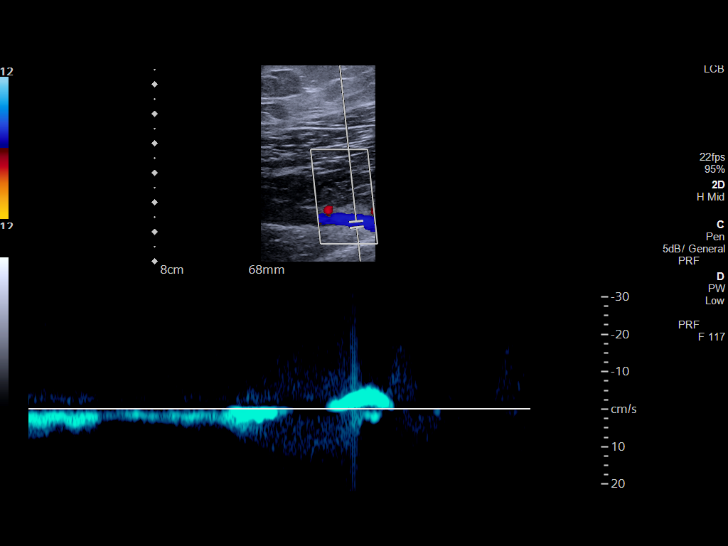
[im 54/60]
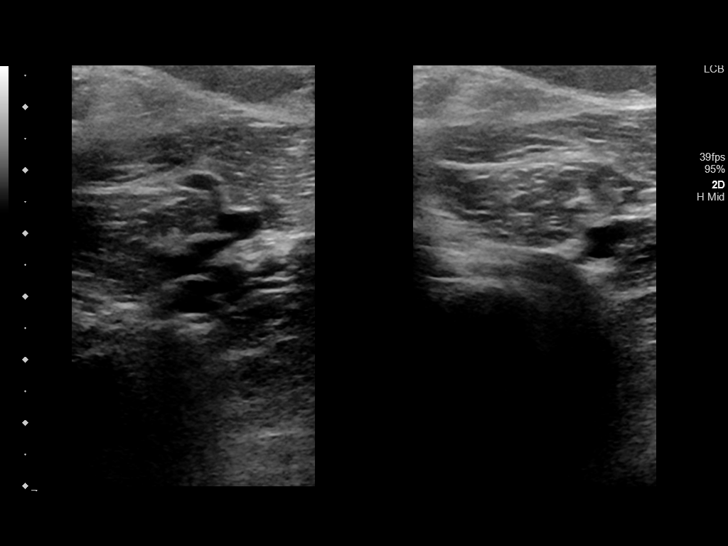
[im 60/60]
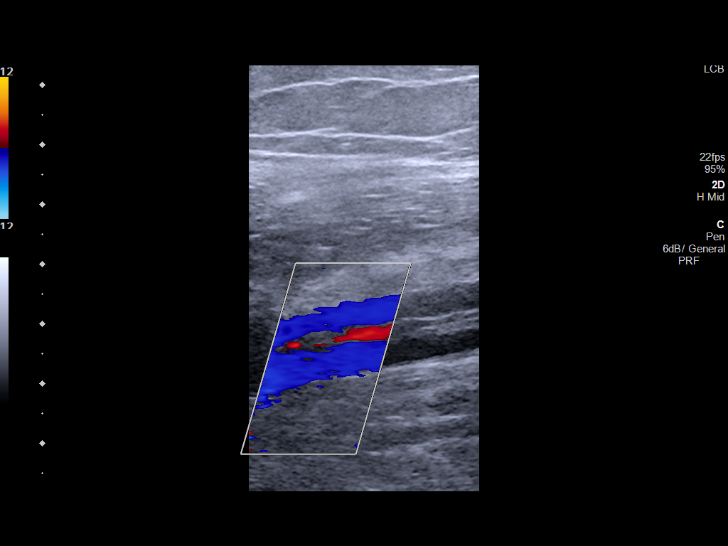

[13 of 24 positions shown; findings below may reference images not displayed]

FINDINGS: RIGHT LOWER EXTREMITY

Common Femoral Vein: No evidence of thrombus. Normal
compressibility, respiratory phasicity and response to augmentation.

Saphenofemoral Junction: No evidence of thrombus. Normal
compressibility and flow on color Doppler imaging.

Profunda Femoral Vein: No evidence of thrombus. Normal
compressibility and flow on color Doppler imaging.

Femoral Vein: No evidence of thrombus. Normal compressibility,
respiratory phasicity and response to augmentation.

Popliteal Vein: No evidence of thrombus. Normal compressibility,
respiratory phasicity and response to augmentation.

Calf Veins: No evidence of thrombus. Normal compressibility and flow
on color Doppler imaging.

Superficial Great Saphenous Vein: No evidence of thrombus. Normal
compressibility.

Venous Reflux:  None.

Other Findings:  None.

LEFT LOWER EXTREMITY

Common Femoral Vein: No evidence of thrombus. Normal
compressibility, respiratory phasicity and response to augmentation.

Saphenofemoral Junction: No evidence of thrombus. Normal
compressibility and flow on color Doppler imaging.

Profunda Femoral Vein: No evidence of thrombus. Normal
compressibility and flow on color Doppler imaging.

Femoral Vein: No evidence of thrombus. Normal compressibility,
respiratory phasicity and response to augmentation.

Popliteal Vein: No evidence of thrombus. Normal compressibility,
respiratory phasicity and response to augmentation.

Calf Veins: No evidence of thrombus. Normal compressibility and flow
on color Doppler imaging.

Superficial Great Saphenous Vein: No evidence of thrombus. Normal
compressibility.

Venous Reflux:  None.

Other Findings:  None.
IMPRESSION: No evidence of deep venous thrombosis.

## 2019-10-29 ENCOUNTER — Ambulatory Visit
Admission: EM | Admit: 2019-10-29 | Discharge: 2019-10-29 | Disposition: A | Discharge status: Home / Self Care | Payer: Managed Care, Other (non HMO) | Attending: Family Medicine | Admitting: Family Medicine

## 2019-10-29 ENCOUNTER — Other Ambulatory Visit: Payer: Self-pay

## 2019-10-29 DIAGNOSIS — H6502 Acute serous otitis media, left ear: Secondary | ICD-10-CM

## 2019-10-29 DIAGNOSIS — G44209 Tension-type headache, unspecified, not intractable: Secondary | ICD-10-CM | POA: Diagnosis not present

## 2019-10-29 HISTORY — DX: Migraine, unspecified, not intractable, without status migrainosus: G43.909

## 2019-10-29 MED ORDER — AMOXICILLIN 875 MG PO TABS: 875.0000 mg | ORAL_TABLET | Freq: Two times a day (BID) | ORAL | 0 refills | Status: DC

## 2019-10-29 MED ORDER — HYDROCODONE-ACETAMINOPHEN 5-325 MG PO TABS: ORAL_TABLET | ORAL | 0 refills | Status: DC

## 2019-10-29 MED ORDER — KETOROLAC TROMETHAMINE 60 MG/2ML IM SOLN
60.0000 mg | Freq: Once | INTRAMUSCULAR | Status: AC
  Administered 2019-10-29: 19:00:00 60 mg via INTRAMUSCULAR

## 2019-10-29 NOTE — ED Triage Notes (Signed)
Pt with history of ear infections and migraines. Pt started on Wednesday with left sided otalgia lasting about two days. Within two days of otalgia, started with headache to back of head. Last 3 days her headache feels worse and her neck feels tight and "like I have to pop it"

## 2019-10-29 NOTE — ED Provider Notes (Addendum)
[facility]    CSN: [id number] Arrival date & time: 10/29/19  1834      History   Chief Complaint Chief Complaint  Patient presents with  . Headache    HPI Kim Garcia is a 33 y.o. female.   33 yo female with a c/o headache for the past 5 days and left ear pain for the past 2 days. Patient has a h/o migraines but states this headache does not feel like her migraines. Over the counter medications have not helped. Denies any fevers, chills, vision changes, numbness/tingling, unilateral weakness. States she's been under a lot of stress lately as well.    Headache   Past Medical History:  Diagnosis Date  . Anemia   . Asthma    as child, rarely uses inhaler  . Depression   . History of blood transfusion 05/2011   [facility]  . Migraines   . Missed ab 2014   no surgery required  . Seasonal allergies   . Sleep apnea   . SVD (spontaneous vaginal delivery) 05/2011   x 1    Patient Active Problem List   Diagnosis Date Noted  . Iron deficiency 06/16/2018  . Vitamin D deficiency 06/16/2018  . Bilateral leg edema 05/19/2018  . Frequent urination 03/09/2018  . Irregular menstrual cycle 03/09/2018  . Respiratory infection 03/09/2018  . Encounter for general adult medical examination with abnormal findings 02/02/2018  . Hypersomnia 02/02/2018  . Morbid obesity (HCC) 02/02/2018  . Elevated blood pressure reading 02/02/2018  . Migraine 08/19/2017  . Anxiety and depression 04/01/2016  . Plantar fasciitis 04/01/2016    Past Surgical History:  Procedure Laterality Date  . CESAREAN SECTION N/A 09/27/2014   Procedure: Primary CESAREAN SECTION;  Surgeon: Serita KyleSheronette A Cousins, MD;  Location: [facility];  Service: Obstetrics;  Laterality: N/A;  EDD: 10/04/14  . CESAREAN SECTION WITH BILATERAL TUBAL LIGATION N/A 02/25/2017   Procedure: REPEAT CESAREAN SECTION WITH BILATERAL TUBAL LIGATION;  Surgeon: Maxie BetterSheronette Cousins, MD;  Location: [facility];   Service: Obstetrics;  Laterality: N/A;  EDD: 02/28/17  . WISDOM TOOTH EXTRACTION      OB History    Gravida  4   Para  3   Term  3   Preterm      AB  1   Living  1     SAB  1   TAB      Ectopic      Multiple  0   Live Births  1            Home Medications    Prior to Admission medications   Medication Sig Start Date End Date Taking? Authorizing Provider  albuterol (VENTOLIN HFA) 108 (90 Base) MCG/ACT inhaler INHALE 1 PUFF INTO THE LUNGS EVERY 4 (FOUR) HOURS AS NEEDED FOR WHEEZING OR SHORTNESS OF BREATH. 04/27/19   Glori LuisSonnenberg, Eric G, MD  amoxicillin (AMOXIL) 875 MG tablet Take 1 tablet (875 mg total) by mouth 2 (two) times daily. 10/29/19   Payton Mccallumonty, Nailea Whitehorn, MD  amoxicillin-clavulanate (AUGMENTIN) 875-125 MG tablet Take 1 tablet by mouth 2 (two) times daily. 11/27/18   Tracey HarriesGuse, Lauren M, FNP  HYDROcodone-acetaminophen (NORCO/VICODIN) 5-325 MG tablet 1-2 tabs po qd prn 10/29/19   Payton Mccallumonty, Lillith Mcneff, MD  meloxicam (MOBIC) 15 MG tablet Take 1 tablet (15 mg total) by mouth daily as needed. 10/29/18   Tommie Samsook, Jayce G, DO  SUMAtriptan (IMITREX) 50 MG tablet Take 1 tablet (50 mg total) by  mouth every 2 (two) hours as needed for migraine. May repeat in 2 hours if headache persists or recurs. 08/19/17   Coral Spikes, DO    Family History Family History  Problem Relation Age of Onset  . Alcoholism Other        Parent  . Colon cancer Maternal Grandmother   . Diabetes Maternal Grandmother   . Stroke Paternal Grandmother   . Diabetes Paternal Grandmother     Social History Social History   Tobacco Use  . Smoking status: Never Smoker  . Smokeless tobacco: Never Used  Substance Use Topics  . Alcohol use: No  . Drug use: No     Allergies   Tape   Review of Systems Review of Systems  Neurological: Positive for headaches.     Physical Exam Triage Vital Signs ED Triage Vitals  Enc Vitals Group     BP 10/29/19 1845 (!) 158/107     Pulse Rate 10/29/19 1845 86      Resp 10/29/19 1845 20     Temp 10/29/19 1845 97.7 F (36.5 C)     Temp Source 10/29/19 1845 Oral     SpO2 10/29/19 1845 100 %     Weight 10/29/19 1844 274 lb (124.3 kg)     Height 10/29/19 1844 5\' 1"  (1.549 m)     Head Circumference --      Peak Flow --      Pain Score 10/29/19 1844 6     Pain Loc --      Pain Edu? --      Excl. in [place]? --    No data found.  Updated Vital Signs BP (!) 158/107 (BP Location: Right Arm)   Pulse 86   Temp 97.7 F (36.5 C) (Oral)   Resp 20   Ht 5\' 1"  (1.549 m)   Wt 124.3 kg   LMP 09/13/2019   SpO2 100%   BMI 51.77 kg/m   Visual Acuity Right Eye Distance:   Left Eye Distance:   Bilateral Distance:    Right Eye Near:   Left Eye Near:    Bilateral Near:     Physical Exam Vitals signs and nursing note reviewed.  Constitutional:      General: She is not in acute distress.    Appearance: She is not toxic-appearing or diaphoretic.  HENT:     Head: Normocephalic and atraumatic.     Right Ear: Tympanic membrane normal.     Left Ear: Tympanic membrane is injected, erythematous and bulging.     Nose: Nose normal.  Eyes:     Extraocular Movements: Extraocular movements intact.     Pupils: Pupils are equal, round, and reactive to light.  Neck:     Musculoskeletal: Normal range of motion and neck supple.  Musculoskeletal:     Cervical back: She exhibits tenderness (over the left trapezius) and spasm (left trapezius). She exhibits normal range of motion, no bony tenderness, no swelling, no edema, no deformity, no laceration and normal pulse.  Neurological:     General: No focal deficit present.     Mental Status: She is alert and oriented to person, place, and time.      UC Treatments / Results  Labs (all labs ordered are listed, but only abnormal results are displayed) Labs Reviewed - No data to display  EKG   Radiology No results found.  Procedures Procedures (including critical care time)  Medications Ordered in UC  Medications  ketorolac (TORADOL)  injection 60 mg (60 mg Intramuscular Given 10/29/19 1917)    Initial Impression / Assessment and Plan / UC Course  I have reviewed the triage vital signs and the nursing notes.  Pertinent labs & imaging results that were available during my care of the patient were reviewed by me and considered in my medical decision making (see chart for details).      Final Clinical Impressions(s) / UC Diagnoses   Final diagnoses:  Tension headache  Acute serous otitis media of left ear, recurrence not specified    ED Prescriptions    Medication Sig Dispense Auth. Provider   amoxicillin (AMOXIL) 875 MG tablet Take 1 tablet (875 mg total) by mouth 2 (two) times daily. 20 tablet Payton Mccallum, MD   HYDROcodone-acetaminophen (NORCO/VICODIN) 5-325 MG tablet 1-2 tabs po qd prn 5 tablet Payton Mccallum, MD      1.diagnosis reviewed with patient 2. Given toradol 60mg  IM x 1 with improvement of symptoms 3. rx as per orders above; reviewed possible side effects, interactions, risks and benefits  4. Recommend supportive treatment with otc tylenol prn 5. Follow-up prn if symptoms worsen or don't improve  I have reviewed the PDMP during this encounter.   , MD 10/29/19 10/31/19, MD 10/29/19 10/31/19    Daphane Shepherd, MD 10/29/19 1945

## 2019-12-12 ENCOUNTER — Ambulatory Visit: Payer: Managed Care, Other (non HMO) | Admitting: Family Medicine

## 2019-12-13 ENCOUNTER — Ambulatory Visit (INDEPENDENT_AMBULATORY_CARE_PROVIDER_SITE_OTHER): Payer: Managed Care, Other (non HMO) | Admitting: Family Medicine

## 2019-12-13 ENCOUNTER — Encounter: Payer: Self-pay | Admitting: Family Medicine

## 2019-12-13 ENCOUNTER — Other Ambulatory Visit: Payer: Self-pay

## 2019-12-13 DIAGNOSIS — F329 Major depressive disorder, single episode, unspecified: Secondary | ICD-10-CM

## 2019-12-13 DIAGNOSIS — F32A Depression, unspecified: Secondary | ICD-10-CM

## 2019-12-13 DIAGNOSIS — F419 Anxiety disorder, unspecified: Secondary | ICD-10-CM | POA: Diagnosis not present

## 2019-12-13 DIAGNOSIS — G43709 Chronic migraine without aura, not intractable, without status migrainosus: Secondary | ICD-10-CM | POA: Diagnosis not present

## 2019-12-13 MED ORDER — SAXENDA 18 MG/3ML ~~LOC~~ SOPN: PEN_INJECTOR | SUBCUTANEOUS | 2 refills | Status: DC

## 2019-12-13 MED ORDER — SUMATRIPTAN SUCCINATE 50 MG PO TABS: 50.0000 mg | ORAL_TABLET | ORAL | 1 refills | Status: DC | PRN

## 2019-12-13 MED ORDER — ESCITALOPRAM OXALATE 10 MG PO TABS: 10.0000 mg | ORAL_TABLET | Freq: Every day | ORAL | 1 refills | Status: DC

## 2019-12-13 NOTE — Progress Notes (Signed)
Virtual Visit via video Note  This visit type was conducted due to national recommendations for restrictions regarding the COVID-19 pandemic (e.g. social distancing).  This format is felt to be most appropriate for this patient at this time.  All issues noted in this document were discussed and addressed.  No physical exam was performed (except for noted visual exam findings with Video Visits).   I connected with Kim Garcia today at  8:30 AM EST by a video enabled telemedicine application and verified that I am speaking with the correct person using two identifiers. Location patient: home Location provider: work  Persons participating in the virtual visit: patient, provider  I discussed the limitations, risks, security and privacy concerns of performing an evaluation and management service by telephone and the availability of in person appointments. I also discussed with the patient that there may be a patient responsible charge related to this service. The patient expressed understanding and agreed to proceed.  Interactive audio and video telecommunications were attempted between this provider and patient, however failed, due to patient having technical difficulties OR patient did not have access to video capability.  We continued and completed visit with audio only.   Reason for visit: follow-up  HPI: Anxiety/depression: Patient notes this has been going on more frequently.  She feels drained and fatigued.  She will feel jittery and anxious.  She is having trouble getting her CPAP mask on because of this.  She is doing deep breathing exercises.  She has a therapist and is doing all the things she typically tells patients to do and notes this is barely helping.  No SI.  Morbid obesity: Patient did drastically change her diet and has been limiting herself to 1200-1400 cal a day.  She is walking 2 miles 3 days a week.  She is down about 10 pounds over the last several months though is interested  in what else she can do to help lose weight.  She has no personal or family history of thyroid cancer, parathyroid cancer, or adrenal cancer.  Migraines: She has these about once a month.  In the past Imitrex has been somewhat beneficial.  No aura.  She does have photophobia and nausea with vomiting related to the migraines.   ROS: See pertinent positives and negatives per HPI.  Past Medical History:  Diagnosis Date  . Anemia   . Asthma    as child, rarely uses inhaler  . Depression   . History of blood transfusion 05/2011   1 unit transfused [facility]  . Migraines   . Missed ab 2014   no surgery required  . Seasonal allergies   . Sleep apnea   . SVD (spontaneous vaginal delivery) 05/2011   x 1    Past Surgical History:  Procedure Laterality Date  . CESAREAN SECTION N/A 09/27/2014   Procedure: Primary CESAREAN SECTION;  Surgeon: Marvene Staff, MD;  Location: [place] [facility];  Service: Obstetrics;  Laterality: N/A;  EDD: 10/04/14  . CESAREAN SECTION WITH BILATERAL TUBAL LIGATION N/A 02/25/2017   Procedure: REPEAT CESAREAN SECTION WITH BILATERAL TUBAL LIGATION;  Surgeon: Servando Salina, MD;  Location: [facility];  Service: Obstetrics;  Laterality: N/A;  EDD: 02/28/17  . WISDOM TOOTH EXTRACTION      Family History  Problem Relation Age of Onset  . Alcoholism Other        Parent  . Colon cancer Maternal Grandmother   . Diabetes Maternal Grandmother   . Stroke Paternal Grandmother   .  Diabetes Paternal Grandmother     SOCIAL HX: Non-smoker.   Current Outpatient Medications:  .  albuterol (VENTOLIN HFA) 108 (90 Base) MCG/ACT inhaler, INHALE 1 PUFF INTO THE LUNGS EVERY 4 (FOUR) HOURS AS NEEDED FOR WHEEZING OR SHORTNESS OF BREATH., Disp: 17 Inhaler, Rfl: 0 .  SUMAtriptan (IMITREX) 50 MG tablet, Take 1 tablet (50 mg total) by mouth every 2 (two) hours as needed for migraine. May repeat in 2 hours if headache persists or recurs., Disp: 10 tablet, Rfl: 1  EXAM:  VITALS per  patient if applicable:  GENERAL: alert, oriented, appears well and in no acute distress  HEENT: atraumatic, conjunttiva clear, no obvious abnormalities on inspection of external nose and ears  NECK: normal movements of the head and neck  LUNGS: on inspection no signs of respiratory distress, breathing rate appears normal, no obvious gross SOB, gasping or wheezing  CV: no obvious cyanosis  MS: moves all visible extremities without noticeable abnormality  PSYCH/NEURO: pleasant and cooperative, no obvious depression or anxiety, speech and thought processing grossly intact  ASSESSMENT AND PLAN:  Discussed the following assessment and plan:  Morbid obesity (HCC) Discussed diet and exercise.  Encouraged her to continue with exercise and increase frequency.  Discussed that the goal calorie level for her weight and height would be about 1700 cal daily for a 2 pound weight loss per week.  Discussed eating too little does not necessarily help people lose weight and that she needs to maintain adequate caloric intake in addition to exercise to help lose weight.  We will see if Saxenda would be approved for her to help with weight loss given that she has not lost a significant amount of weight with altering diet and exercise practices.  Anxiety and depression Increasing issues with this.  No SI.  We will trial Lexapro.  Follow-up in 6 weeks.  Migraine Occurring once monthly.  Imitrex has been somewhat beneficial.  We will refill this.  I encouraged her to take it at the first sign of a migraine as it will be more beneficial.   No orders of the defined types were placed in this encounter.   No orders of the defined types were placed in this encounter.    I discussed the assessment and treatment plan with the patient. The patient was provided an opportunity to ask questions and all were answered. The patient agreed with the plan and demonstrated an understanding of the instructions.   The  patient was advised to call back or seek an in-person evaluation if the symptoms worsen or if the condition fails to improve as anticipated.   [name], MD

## 2019-12-13 NOTE — Assessment & Plan Note (Signed)
Increasing issues with this.  No SI.  We will trial Lexapro.  Follow-up in 6 weeks.

## 2019-12-13 NOTE — Assessment & Plan Note (Signed)
Occurring once monthly.  Imitrex has been somewhat beneficial.  We will refill this.  I encouraged her to take it at the first sign of a migraine as it will be more beneficial.

## 2019-12-13 NOTE — Assessment & Plan Note (Addendum)
Discussed diet and exercise.  Encouraged her to continue with exercise and increase frequency.  Discussed that the goal calorie level for her weight and height would be about 1700 cal daily for a 2 pound weight loss per week.  Discussed eating too little does not necessarily help people lose weight and that she needs to maintain adequate caloric intake in addition to exercise to help lose weight.  We will see if Saxenda would be approved for her to help with weight loss given that she has not lost a significant amount of weight with altering diet and exercise practices.  Lab work as outlined below.

## 2019-12-19 ENCOUNTER — Telehealth: Payer: Self-pay | Admitting: Family Medicine

## 2019-12-19 NOTE — Telephone Encounter (Signed)
I called that patient and informed her that I would be doing a prior Auth on this medication and I may hear something back on tomorrow. Faust Thorington,cma

## 2019-12-19 NOTE — Telephone Encounter (Signed)
Pt states that insurance will not cover Liraglutide -Weight Management (SAXENDA) 18 MG/3ML SOPN-Pt is open to whatever else is prescribed as long as it is covered by her insurance. Please advise

## 2020-01-03 ENCOUNTER — Encounter: Payer: Self-pay | Admitting: Internal Medicine

## 2020-01-03 ENCOUNTER — Other Ambulatory Visit: Payer: Self-pay

## 2020-01-03 ENCOUNTER — Ambulatory Visit (INDEPENDENT_AMBULATORY_CARE_PROVIDER_SITE_OTHER): Payer: Managed Care, Other (non HMO) | Admitting: Internal Medicine

## 2020-01-03 VITALS — Ht 62.0 in | Wt 265.0 lb

## 2020-01-03 DIAGNOSIS — H6690 Otitis media, unspecified, unspecified ear: Secondary | ICD-10-CM

## 2020-01-03 MED ORDER — PHENTERMINE HCL 37.5 MG PO TABS: 18.2500 mg | ORAL_TABLET | Freq: Every day | ORAL | 0 refills | Status: DC

## 2020-01-03 MED ORDER — NEOMYCIN-POLYMYXIN-HC 3.5-10000-1 OT SOLN: 4.0000 [drp] | Freq: Four times a day (QID) | OTIC | 0 refills | Status: DC

## 2020-01-03 MED ORDER — AZITHROMYCIN 250 MG PO TABS: ORAL_TABLET | ORAL | 0 refills | Status: DC

## 2020-01-03 MED ORDER — CIPROFLOXACIN-DEXAMETHASONE 0.3-0.1 % OT SUSP: 4.0000 [drp] | Freq: Two times a day (BID) | OTIC | 0 refills | Status: DC

## 2020-01-03 NOTE — Progress Notes (Signed)
Yes I did the PA and this is the message they sent back "Your patient will pay 100% of a discounted price for this medication. Any amount the patient pays will not apply to their deductible or out-of-pocket expenses. This PA cannot be submitted. Please refer to the other notes for further details."  Paislea Hatton,cma

## 2020-01-03 NOTE — Patient Instructions (Signed)
You will need to monitor your blood pressure on Adipex medication if elevating >130/>80 call the office  If you dont have a blood pressure cuff pleas purchase an automatic upper arm cuff for your arm size on [employer] ~$25-35   Phentermine tablets or capsules What is this medicine? PHENTERMINE (FEN ter meen) decreases your appetite. It is used with a reduced calorie diet and exercise to help you lose weight. This medicine may be used for other purposes; ask your health care provider or pharmacist if you have questions. COMMON BRAND NAME(S): Adipex-P, Atti-Plex P, Atti-Plex P Spansule, Fastin, Lomaira, Pro-Fast, Tara-8 What should I tell my health care provider before I take this medicine? They need to know if you have any of these conditions:  agitation or nervousness  diabetes  glaucoma  heart disease  high blood pressure  history of drug abuse or addiction  history of stroke  kidney disease  lung disease called Primary Pulmonary Hypertension (PPH)  taken an MAOI like Carbex, Eldepryl, Marplan, Nardil, or Parnate in last 14 days  taking stimulant medicines for attention disorders, weight loss, or to stay awake  thyroid disease  an unusual or allergic reaction to phentermine, other medicines, foods, dyes, or preservatives  pregnant or trying to get pregnant  breast-feeding How should I use this medicine? Take this medicine by mouth with a glass of water. Follow the directions on the prescription label. Take your medicine at regular intervals. Do not take it more often than directed. Do not stop taking except on your doctor's advice. Talk to your pediatrician regarding the use of this medicine in children. While this drug may be prescribed for children 17 years or older for selected conditions, precautions do apply. Overdosage: If you think you have taken too much of this medicine contact a poison control center or emergency room at once. NOTE: This medicine is only for you.  Do not share this medicine with others. What if I miss a dose? If you miss a dose, take it as soon as you can. If it is almost time for your next dose, take only that dose. Do not take double or extra doses. What may interact with this medicine? Do not take this medicine with any of the following medications:  MAOIs like Carbex, Eldepryl, Marplan, Nardil, and Parnate This medicine may also interact with the following medications:  alcohol  certain medicines for depression, anxiety, or psychotic disorders  certain medicines for high blood pressure  linezolid  medicines for colds or breathing difficulties like pseudoephedrine or phenylephrine  medicines for diabetes  sibutramine  stimulant medicines for attention disorders, weight loss, or to stay awake This list may not describe all possible interactions. Give your health care provider a list of all the medicines, herbs, non-prescription drugs, or dietary supplements you use. Also tell them if you smoke, drink alcohol, or use illegal drugs. Some items may interact with your medicine. What should I watch for while using this medicine? Visit your doctor or health care provider for regular checks on your progress. Do not stop taking except on your health care provider's advice. You may develop a severe reaction. Your health care provider will tell you how much medicine to take. Do not take this medicine close to bedtime. It may prevent you from sleeping. You may get drowsy or dizzy. Do not drive, use machinery, or do anything that needs mental alertness until you know how this medicine affects you. Do not stand or sit up quickly, especially if  you are an older patient. This reduces the risk of dizzy or fainting spells. Alcohol may increase dizziness and drowsiness. Avoid alcoholic drinks. This medicine may affect blood sugar levels. Ask your healthcare provider if changes in diet or medicines are needed if you have diabetes. Women should  inform their health care provider if they wish to become pregnant or think they might be pregnant. Losing weight while pregnant is not advised and may cause harm to the unborn child. Talk to your health care provider for more information. What side effects may I notice from receiving this medicine? Side effects that you should report to your doctor or health care professional as soon as possible:  allergic reactions like skin rash, itching or hives, swelling of the face, lips, or tongue  breathing problems  changes in emotions or moods  changes in vision  chest pain or chest tightness  fast, irregular heartbeat  feeling faint or lightheaded  increased blood pressure  irritable  restlessness  tremors  seizures  signs and symptoms of a stroke like changes in vision; confusion; trouble speaking or understanding; severe headaches; sudden numbness or weakness of the face, arm or leg; trouble walking; dizziness; loss of balance or coordination  unusually weak or tired Side effects that usually do not require medical attention (report to your doctor or health care professional if they continue or are bothersome):  changes in taste  constipation or diarrhea  dizziness  dry mouth  headache  trouble sleeping  upset stomach This list may not describe all possible side effects. Call your doctor for medical advice about side effects. You may report side effects to FDA at 1-800-FDA-1088. Where should I keep my medicine? Keep out of the reach of children. This medicine can be abused. Keep your medicine in a safe place to protect it from theft. Do not share this medicine with anyone. Selling or giving away this medicine is dangerous and against the law. This medicine may cause harm and death if it is taken by other adults, children, or pets. Return medicine that has not been used to an official disposal site. Contact the DEA at 587-109-5506 or your city/county government to find a  site. If you cannot return the medicine, mix any unused medicine with a substance like cat litter or coffee grounds. Then throw the medicine away in a sealed container like a sealed bag or coffee can with a lid. Do not use the medicine after the expiration date. Store at room temperature between 20 and 25 degrees C (68 and 77 degrees F). Keep container tightly closed. NOTE: This sheet is a summary. It may not cover all possible information. If you have questions about this medicine, talk to your doctor, pharmacist, or health care provider.  2020 Elsevier/Gold Standard (2019-10-05 12:54:20)

## 2020-01-03 NOTE — Progress Notes (Signed)
Virtual Visit via Video Note  I connected with Kim Garcia  on 01/03/20 at 11:45 AM EST by a video enabled telemedicine application and verified that I am speaking with the correct person using two identifiers.  Location patient: home Location provider:work or home office Persons participating in the virtual visit: patient, provider, pts daughter   I discussed the limitations of evaluation and management by telemedicine and the availability of in person appointments. The patient expressed understanding and agreed to proceed.   HPI: 1. Seen 10/29/19 Urgent care for ear pain given amoxil x 10 days with resolution of sx's h/o ear infections and eczema in ears per ENT in the past c/o ear pain left ear initially now right ear  (but L>R) with mild to moderate pain even behind her ears on her neck since 01/02/20 and she is c/w ear infection. Denies swollen glands, fever, no evidence of eczema currently in ears tried ibuprofen and allergy pill as suggested by ent in the past w/o relief. She denies ear itching/eczema sx's since stopped her dry shampoo  Denies sore throat but she does have runny nose, denies ear popping or h/a  2. Obesity wants to know status of saxenda approval ordered 12/13/19 will check with PCP  She is using nuum and trying to exercise lost 10 lbs in 1 month doing this   3. Anxiety improved on lexapro 10 mg somewhat  ROS: See pertinent positives and negatives per HPI.  Past Medical History:  Diagnosis Date  . Anemia   . Asthma    as child, rarely uses inhaler  . Depression   . History of blood transfusion 05/2011   1 unit transfused at [facility]  . Migraines   . Missed ab 2014   no surgery required  . Seasonal allergies   . Sleep apnea   . SVD (spontaneous vaginal delivery) 05/2011   x 1    Past Surgical History:  Procedure Laterality Date  . CESAREAN SECTION N/A 09/27/2014   Procedure: Primary CESAREAN SECTION;  Surgeon: Serita Kyle, MD;  Location: [facility];  Service:  Obstetrics;  Laterality: N/A;  EDD: 10/04/14  . CESAREAN SECTION WITH BILATERAL TUBAL LIGATION N/A 02/25/2017   Procedure: REPEAT CESAREAN SECTION WITH BILATERAL TUBAL LIGATION;  Surgeon: Maxie Better, MD;  Location: [facility];  Service: Obstetrics;  Laterality: N/A;  EDD: 02/28/17  . WISDOM TOOTH EXTRACTION      Family History  Problem Relation Age of Onset  . Alcoholism Other        Parent  . Colon cancer Maternal Grandmother   . Diabetes Maternal Grandmother   . Stroke Paternal Grandmother   . Diabetes Paternal Grandmother     SOCIAL HX:  Married with kids    Current Outpatient Medications:  .  albuterol (VENTOLIN HFA) 108 (90 Base) MCG/ACT inhaler, INHALE 1 PUFF INTO THE LUNGS EVERY 4 (FOUR) HOURS AS NEEDED FOR WHEEZING OR SHORTNESS OF BREATH., Disp: 17 Inhaler, Rfl: 0 .  escitalopram (LEXAPRO) 10 MG tablet, Take 1 tablet (10 mg total) by mouth daily., Disp: 90 tablet, Rfl: 1 .  Liraglutide -Weight Management (SAXENDA) 18 MG/3ML SOPN, Inject 0.6 mg into the skin daily for 7 days, THEN 1.2 mg daily for 7 days, THEN 1.8 mg daily for 7 days, THEN 2.4 mg daily for 7 days, THEN 3 mg daily., Disp: 4 pen, Rfl: 2 .  SUMAtriptan (IMITREX) 50 MG tablet, Take 1 tablet (50 mg total) by mouth every 2 (two) hours as needed  for migraine. May repeat in 2 hours if headache persists or recurs., Disp: 10 tablet, Rfl: 1 .  azithromycin (ZITHROMAX) 250 MG tablet, 2 pills day 1 and 1 pill day 2-5 with food, Disp: 6 tablet, Rfl: 0 .  ciprofloxacin-dexamethasone (CIPRODEX) OTIC suspension, Place 4 drops into both ears 2 (two) times daily. X 1 week, Disp: 7.5 mL, Rfl: 0 .  neomycin-polymyxin-hydrocortisone (CORTISPORIN) OTIC solution, Place 4 drops into both ears 4 (four) times daily. X 1 week, Disp: 10 mL, Rfl: 0 .  phentermine (ADIPEX-P) 37.5 MG tablet, Take 0.5-1 tablets (18.75-37.5 mg total) by mouth daily before breakfast., Disp: 60 tablet, Rfl: 0  EXAM:  VITALS per patient if  applicable:  GENERAL: alert, oriented, appears well and in no acute distress  HEENT: atraumatic, conjunttiva clear, no obvious abnormalities on inspection of external nose and ears  NECK: normal movements of the head and neck  LUNGS: on inspection no signs of respiratory distress, breathing rate appears normal, no obvious gross SOB, gasping or wheezing  CV: no obvious cyanosis  MS: moves all visible extremities without noticeable abnormality  PSYCH/NEURO: pleasant and cooperative, no obvious depression or anxiety, speech and thought processing grossly intact  ASSESSMENT AND PLAN:  Discussed the following assessment and plan:  Acute otitis media, unspecified otitis media type - Plan: azithromycin (ZITHROMAX) 250 MG tablet x 5 days, neomycin-polymyxin-hydrocortisone (CORTISPORIN) OTIC solution OR ciprofloxacin-dexamethasone (CIPRODEX) OTIC suspension pending cost  Consider levaquin 750 mg x 5 days if not better by next week  Consider f/u with ENT   Morbid obesity (Kim Garcia) - Plan: phentermine (ADIPEX-P) 37.5 MG tablet 1/2 pill x 1 week then increase to 1 whole pill x 4 month on and 3 months off sent 2 month supply to pharmacy for now reviewed side effects  Continue exercise, diet changes, nuum app   -we discussed possible serious and likely etiologies, options for evaluation and workup, limitations of telemedicine visit vs in person visit, treatment, treatment risks and precautions. Pt prefers to treat via telemedicine empirically rather then risking or undertaking an in person visit at this moment. Patient agrees to seek prompt in person care if worsening, new symptoms arise, or if is not improving with treatment.   I discussed the assessment and treatment plan with the patient. The patient was provided an opportunity to ask questions and all were answered. The patient agreed with the plan and demonstrated an understanding of the instructions.   The patient was advised to call back or seek  an in-person evaluation if the symptoms worsen or if the condition fails to improve as anticipated.  Time spent 20-29 minutes  Delorise Jackson, MD

## 2020-01-06 ENCOUNTER — Other Ambulatory Visit: Payer: Self-pay

## 2020-01-06 ENCOUNTER — Emergency Department
Admission: EM | Admit: 2020-01-06 | Discharge: 2020-01-06 | Disposition: A | Discharge status: Home / Self Care | Payer: Managed Care, Other (non HMO) | Attending: Student | Admitting: Student

## 2020-01-06 ENCOUNTER — Telehealth: Payer: Managed Care, Other (non HMO)

## 2020-01-06 DIAGNOSIS — Z79899 Other long term (current) drug therapy: Secondary | ICD-10-CM | POA: Diagnosis not present

## 2020-01-06 DIAGNOSIS — R509 Fever, unspecified: Secondary | ICD-10-CM

## 2020-01-06 DIAGNOSIS — R5381 Other malaise: Secondary | ICD-10-CM | POA: Insufficient documentation

## 2020-01-06 DIAGNOSIS — R55 Syncope and collapse: Secondary | ICD-10-CM

## 2020-01-06 DIAGNOSIS — U071 COVID-19: Secondary | ICD-10-CM | POA: Diagnosis not present

## 2020-01-06 DIAGNOSIS — I951 Orthostatic hypotension: Secondary | ICD-10-CM

## 2020-01-06 DIAGNOSIS — J45909 Unspecified asthma, uncomplicated: Secondary | ICD-10-CM | POA: Insufficient documentation

## 2020-01-06 LAB — CBC
HCT: 46.5 % — ABNORMAL HIGH (ref 36.0–46.0)
Hemoglobin: 14.4 g/dL (ref 12.0–15.0)
MCH: 25.8 pg — ABNORMAL LOW (ref 26.0–34.0)
MCHC: 31 g/dL (ref 30.0–36.0)
MCV: 83.2 fL (ref 80.0–100.0)
Platelets: 155 10*3/uL (ref 150–400)
RBC: 5.59 MIL/uL — ABNORMAL HIGH (ref 3.87–5.11)
RDW: 13 % (ref 11.5–15.5)
WBC: 4.1 10*3/uL (ref 4.0–10.5)
nRBC: 0 % (ref 0.0–0.2)

## 2020-01-06 LAB — URINALYSIS, COMPLETE (UACMP) WITH MICROSCOPIC
Bilirubin Urine: NEGATIVE
Glucose, UA: NEGATIVE mg/dL
Hgb urine dipstick: NEGATIVE
Ketones, ur: NEGATIVE mg/dL
Nitrite: NEGATIVE
Protein, ur: 30 mg/dL — AB
Specific Gravity, Urine: 1.015 (ref 1.005–1.030)
pH: 5 (ref 5.0–8.0)

## 2020-01-06 LAB — BASIC METABOLIC PANEL
Anion gap: 12 (ref 5–15)
BUN: 11 mg/dL (ref 6–20)
CO2: 22 mmol/L (ref 22–32)
Calcium: 8.6 mg/dL — ABNORMAL LOW (ref 8.9–10.3)
Chloride: 104 mmol/L (ref 98–111)
Creatinine, Ser: 0.69 mg/dL (ref 0.44–1.00)
GFR calc Af Amer: >60 mL/min (ref >60)
GFR calc non Af Amer: >60 mL/min (ref >60)
Glucose, Bld: 93 mg/dL (ref 70–99)
Potassium: 3.5 mmol/L (ref 3.5–5.1)
Sodium: 138 mmol/L (ref 135–145)

## 2020-01-06 LAB — POC SARS CORONAVIRUS 2 AG: SARS Coronavirus 2 Ag: POSITIVE — AB

## 2020-01-06 LAB — POC URINE PREG, ED: Preg Test, Ur: NEGATIVE

## 2020-01-06 MED ORDER — ACETAMINOPHEN 325 MG PO TABS
650.0000 mg | ORAL_TABLET | Freq: Once | ORAL | Status: AC | PRN
  Administered 2020-01-06: 650 mg via ORAL
  Filled 2020-01-06: qty 2

## 2020-01-06 MED ORDER — SODIUM CHLORIDE 0.9% FLUSH: 3.0000 mL | Freq: Once | INTRAVENOUS | Status: DC

## 2020-01-06 NOTE — Discharge Instructions (Signed)
Thank you for letting us take care of you in the emergency department today.  Your work-up today has shown that you are POSITIVE for COVID.  Continue to use ibuprofen and Tylenol as directed to help with symptom management.  Please stay well hydrated and continue to eat a healthy diet.  You can use rehydration solution such as Gatorade, Pedialyte, broth, etc.  You can continue to take your antibiotic and ear drops.  Talk to your doctor about when best to restart your phentermine while recovering from COVID.  Please follow-up with your primary care doctor once you have recovered, to review your ER visit and follow-up on your symptoms.  If you continue to have passing out episodes despite all of this, your primary doctor may consider doing a Holter monitor, as we discussed.  You should remain quarantined for the next 14 days since your positive test today.  Please return to the emergency department for any new or worsening symptoms, especially any concern for dehydration, trouble breathing, chest pain.

## 2020-01-06 NOTE — ED Triage Notes (Signed)
Pt presents via POV c/o syncope x3 yesterday. Reports recently dx with ear infection and started on abx on 1/21. Reports fever started yesterday. Reports took Motrin 0900 today.

## 2020-01-06 NOTE — ED Provider Notes (Signed)
[facility] Emergency Department Provider Note  ____________________________________________   First MD Initiated Contact with Patient 01/06/20 1458     (approximate)  I have reviewed the triage vital signs and the nursing notes.  History  Chief Complaint Loss of Consciousness    HPI Kim Garcia is a 34 y.o. female with hx of obesity, depression, migraines who presents to the ED for fever, malaise, and syncope.  Patient states she has been feeling generally unwell for about a week or so.  Reports body aches, malaise, fatigue.  Last night she was using the restroom, when she stood up from the toilet she felt nauseous and nearly passed out.  No shaking or seizure-like activity, no postictal state.  She sat down with her back against the tub.  She attempted to stand a second time, and again felt nauseous and lightheaded and syncopized.  She called for her husband, he tried to help her up to move into the bedroom, and again for third time when she went from sitting to standing she had a brief syncopal episode.  None since then.  She denies any associated palpitations, chest pain, shortness of breath.  No history of syncope, seizures, VTE, or arrhythmias. She has a child at home who also has a fever.    Patient denies any difficulty breathing, chest pain, cough, hemoptysis.  No vomiting or diarrhea.  Not on any hormonal medications, no leg swelling, no hemoptysis, no history of VTE.  Had a recent telehealth visit with her PCP for ear pain on 1/21, started on azithromycin and Ciprodex drops. Also started on Phentermine at same time for obesity.    Past Medical Hx Past Medical History:  Diagnosis Date  . Anemia   . Asthma    as child, rarely uses inhaler  . Depression   . History of blood transfusion 05/2011   1 unit transfused at [facility]  . Migraines   . Missed ab 2014   no surgery required  . Seasonal allergies   . Sleep apnea   . SVD (spontaneous vaginal  delivery) 05/2011   x 1    Problem List Patient Active Problem List   Diagnosis Date Noted  . Iron deficiency 06/16/2018  . Vitamin D deficiency 06/16/2018  . Bilateral leg edema 05/19/2018  . Frequent urination 03/09/2018  . Irregular menstrual cycle 03/09/2018  . Respiratory infection 03/09/2018  . Encounter for general adult medical examination with abnormal findings 02/02/2018  . Hypersomnia 02/02/2018  . Morbid obesity (HCC) 02/02/2018  . Elevated blood pressure reading 02/02/2018  . Migraine 08/19/2017  . Anxiety and depression 04/01/2016  . Plantar fasciitis 04/01/2016    Past Surgical Hx Past Surgical History:  Procedure Laterality Date  . CESAREAN SECTION N/A 09/27/2014   Procedure: Primary CESAREAN SECTION;  Surgeon: Serita Kyle, MD;  Location: [facility];  Service: Obstetrics;  Laterality: N/A;  EDD: 10/04/14  . CESAREAN SECTION WITH BILATERAL TUBAL LIGATION N/A 02/25/2017   Procedure: REPEAT CESAREAN SECTION WITH BILATERAL TUBAL LIGATION;  Surgeon: Maxie Better, MD;  Location: [facility];  Service: Obstetrics;  Laterality: N/A;  EDD: 02/28/17  . WISDOM TOOTH EXTRACTION      Medications Prior to Admission medications   Medication Sig Start Date End Date Taking? Authorizing Provider  albuterol (VENTOLIN HFA) 108 (90 Base) MCG/ACT inhaler INHALE 1 PUFF INTO THE LUNGS EVERY 4 (FOUR) HOURS AS NEEDED FOR WHEEZING OR SHORTNESS OF BREATH. 04/27/19   Glori Luis, MD  azithromycin (  ZITHROMAX) 250 MG tablet 2 pills day 1 and 1 pill day 2-5 with food 01/03/20   McLean-Scocuzza, Pasty Spillers, MD  ciprofloxacin-dexamethasone (CIPRODEX) OTIC suspension Place 4 drops into both ears 2 (two) times daily. X 1 week 01/03/20   McLean-Scocuzza, Pasty Spillers, MD  escitalopram (LEXAPRO) 10 MG tablet Take 1 tablet (10 mg total) by mouth daily. 12/13/19   Glori Luis, MD  Liraglutide -Weight Management (SAXENDA) 18 MG/3ML SOPN Inject 0.6 mg into the skin daily for 7 days,  THEN 1.2 mg daily for 7 days, THEN 1.8 mg daily for 7 days, THEN 2.4 mg daily for 7 days, THEN 3 mg daily. 12/13/19 07/08/20  Glori Luis, MD  neomycin-polymyxin-hydrocortisone (CORTISPORIN) OTIC solution Place 4 drops into both ears 4 (four) times daily. X 1 week 01/03/20   McLean-Scocuzza, Pasty Spillers, MD  phentermine (ADIPEX-P) 37.5 MG tablet Take 0.5-1 tablets (18.75-37.5 mg total) by mouth daily before breakfast. 01/03/20   McLean-Scocuzza, Pasty Spillers, MD  SUMAtriptan (IMITREX) 50 MG tablet Take 1 tablet (50 mg total) by mouth every 2 (two) hours as needed for migraine. May repeat in 2 hours if headache persists or recurs. 12/13/19   Glori Luis, MD    Allergies Tape  Family Hx Family History  Problem Relation Age of Onset  . Alcoholism Other        Parent  . Colon cancer Maternal Grandmother   . Diabetes Maternal Grandmother   . Stroke Paternal Grandmother   . Diabetes Paternal Grandmother     Social Hx Social History   Tobacco Use  . Smoking status: Never Smoker  . Smokeless tobacco: Never Used  Substance Use Topics  . Alcohol use: No  . Drug use: No     Review of Systems  Constitutional: + for fever, chills, fatigue, malaise. Eyes: Negative for visual changes. ENT: Negative for sore throat. Cardiovascular: Negative for chest pain. Respiratory: Negative for shortness of breath. Gastrointestinal: + for nausea.  Genitourinary: Negative for dysuria. Musculoskeletal: Negative for leg swelling. Skin: Negative for rash. Neurological: Negative for for headaches. + syncope   Physical Exam  Vital Signs: ED Triage Vitals  Enc Vitals Group     BP 01/06/20 1344 (!) 146/102     Pulse Rate 01/06/20 1344 (!) 103     Resp 01/06/20 1344 16     Temp 01/06/20 1344 (!) 100.9 F (38.3 C)     Temp Source 01/06/20 1344 Oral     SpO2 01/06/20 1344 96 %     Weight 01/06/20 1346 260 lb (117.9 kg)     Height 01/06/20 1346 5\' 2"  (1.575 m)     Head Circumference --       Peak Flow --      Pain Score 01/06/20 1345 0     Pain Loc --      Pain Edu? --      Excl. in GC? --     Constitutional: Alert and oriented.  Head: Normocephalic. Atraumatic. Eyes: Conjunctivae clear. Sclera anicteric. Ears: R EAC with mild erythema/dryness/irritation. TM appears well. L EAC appears normal. L TM slightly dull with question effusion posteriorly. No mastoid tenderness or displacement of pinna.  Nose: No congestion. No rhinorrhea. Mouth/Throat: Wearing mask.  Neck: No stridor.   Cardiovascular: Normal rate, regular rhythm. Extremities well perfused. Respiratory: Normal respiratory effort.  Lungs CTAB. Gastrointestinal: Soft. Non-tender. Non-distended.  Musculoskeletal: No lower extremity edema. No deformities. Neurologic:  Normal speech and language. No gross focal neurologic deficits  are appreciated.  Skin: Skin is warm, dry and intact. No rash noted. Psychiatric: Mood and affect are appropriate for situation.  EKG  Personally reviewed.   Rate: 82 Rhythm: sinus Axis: leftward Intervals: WNL No evidence of WPW, B[place], or prolonged QTc No STEMI    Procedures  Procedure(s) performed (including critical care):  Procedures   Initial Impression / Assessment and Plan / ED Course  34 y.o. female who presents to the ED for fever, malaise, and a few episodes of likely orthostatic syncope, as above.  Ddx: orthostatic syncope, COVID, arrhythmia, electrolyte abnormality, amongst others  Work-up reveals patient is COVID positive, the likely etiology of her symptoms.  Suspect her syncopal episodes were orthostatic based on description.  EKG without evidence of acute arrhythmia.  No evidence of electrolyte derangements.  Negative pregnancy test.    She denies any shortness of breath, is not in any respiratory distress, no evidence of hypoxia here.  Otherwise stable for discharge.    Advised supportive care, adequate hydration.  Advised holding her newly started  phentermine while recovering from [place] in case this is worsening her symptoms, and touch base with her PCP regarding when best to restart this.  Should she have further episodes of syncope despite adequate hydration and supportive care, discussed possible Holter monitor through her PCP.  She voices understanding and is comfortable with the plan.  Discussed proper quarantine instructions for her and her family. Given return precautions.   Final Clinical Impression(s) / ED Diagnosis  Final diagnoses:  Fever in adult  Syncope, unspecified syncope type  COVID-19  Orthostatic syncope       Note:  This document was prepared using Dragon voice recognition software and may include unintentional dictation errors.   Lilia Pro., MD 01/06/20 1539

## 2020-01-07 ENCOUNTER — Telehealth: Payer: Self-pay | Admitting: Internal Medicine

## 2020-01-07 ENCOUNTER — Other Ambulatory Visit: Payer: Self-pay | Admitting: Internal Medicine

## 2020-01-07 DIAGNOSIS — U071 COVID-19: Secondary | ICD-10-CM

## 2020-01-07 NOTE — Progress Notes (Signed)
  I connected by phone with Kim Garcia on 01/07/2020 at 5:30 PM to discuss the potential use of an new treatment for mild to moderate COVID-19 viral infection in non-hospitalized patients.  This patient is a 34 y.o. female that meets the FDA criteria for Emergency Use Authorization of bamlanivimab or casirivimab\imdevimab.  Has a (+) direct SARS-CoV-2 viral test result  Has mild or moderate COVID-19   Is ? 34 years of age and weighs ? 40 kg  Is NOT hospitalized due to COVID-19  Is NOT requiring oxygen therapy or requiring an increase in baseline oxygen flow rate due to COVID-19  Is within 10 days of symptom onset  Has at least one of the high risk factor(s) for progression to severe COVID-19 and/or hospitalization as defined in EUA.  Specific high risk criteria : BMI >/= 35   I have spoken and communicated the following to the patient or parent/caregiver:  1. FDA has authorized the emergency use of bamlanivimab and casirivimab\imdevimab for the treatment of mild to moderate COVID-19 in adults and pediatric patients with positive results of direct SARS-CoV-2 viral testing who are 39 years of age and older weighing at least 40 kg, and who are at high risk for progressing to severe COVID-19 and/or hospitalization.  2. The significant known and potential risks and benefits of bamlanivimab and casirivimab\imdevimab, and the extent to which such potential risks and benefits are unknown.  3. Information on available alternative treatments and the risks and benefits of those alternatives, including clinical trials.  4. Patients treated with bamlanivimab and casirivimab\imdevimab should continue to self-isolate and use infection control measures (e.g., wear mask, isolate, social distance, avoid sharing personal items, clean and disinfect "high touch" surfaces, and frequent handwashing) according to CDC guidelines.   5. The patient or parent/caregiver has the option to accept or refuse  bamlanivimab or casirivimab\imdevimab .  After reviewing this information with the patient, The patient agreed to proceed with receiving the bamlanimivab infusion and will be provided a copy of the Fact sheet prior to receiving the infusion.Cyndee Brightly, NP-C [facility]

## 2020-01-10 ENCOUNTER — Ambulatory Visit (HOSPITAL_COMMUNITY)
Admission: RE | Admit: 2020-01-10 | Discharge: 2020-01-10 | Disposition: A | Discharge status: Home / Self Care | Payer: Managed Care, Other (non HMO) | Source: Ambulatory Visit | Attending: Pulmonary Disease | Admitting: Pulmonary Disease

## 2020-01-10 DIAGNOSIS — U071 COVID-19: Secondary | ICD-10-CM | POA: Insufficient documentation

## 2020-01-10 DIAGNOSIS — Z23 Encounter for immunization: Secondary | ICD-10-CM | POA: Insufficient documentation

## 2020-01-10 MED ORDER — EPINEPHRINE 0.3 MG/0.3ML IJ SOAJ: 0.3000 mg | Freq: Once | INTRAMUSCULAR | Status: DC | PRN

## 2020-01-10 MED ORDER — SODIUM CHLORIDE 0.9 % IV SOLN
INTRAVENOUS | Status: DC | PRN
  Administered 2020-01-10: 250 mL via INTRAVENOUS

## 2020-01-10 MED ORDER — ALBUTEROL SULFATE HFA 108 (90 BASE) MCG/ACT IN AERS: 2.0000 | INHALATION_SPRAY | Freq: Once | RESPIRATORY_TRACT | Status: DC | PRN

## 2020-01-10 MED ORDER — METHYLPREDNISOLONE SODIUM SUCC 125 MG IJ SOLR: 125.0000 mg | Freq: Once | INTRAMUSCULAR | Status: DC | PRN

## 2020-01-10 MED ORDER — DIPHENHYDRAMINE HCL 50 MG/ML IJ SOLN: 50.0000 mg | Freq: Once | INTRAMUSCULAR | Status: DC | PRN

## 2020-01-10 MED ORDER — SODIUM CHLORIDE 0.9 % IV SOLN
700.0000 mg | Freq: Once | INTRAVENOUS | Status: AC
  Administered 2020-01-10: 17:00:00 700 mg via INTRAVENOUS
  Filled 2020-01-10: qty 20

## 2020-01-10 MED ORDER — FAMOTIDINE IN NACL 20-0.9 MG/50ML-% IV SOLN: 20.0000 mg | Freq: Once | INTRAVENOUS | Status: DC | PRN

## 2020-01-10 NOTE — Discharge Instructions (Signed)
COVID-19 COVID-19 is a respiratory infection that is caused by a virus called severe acute respiratory syndrome coronavirus 2 (SARS-CoV-2). The disease is also known as coronavirus disease or novel coronavirus. In some people, the virus may not cause any symptoms. In others, it may cause a serious infection. The infection can get worse quickly and can lead to complications, such as:  Pneumonia, or infection of the lungs.  Acute respiratory distress syndrome or ARDS. This is a condition in which fluid build-up in the lungs prevents the lungs from filling with air and passing oxygen into the blood.  Acute respiratory failure. This is a condition in which there is not enough oxygen passing from the lungs to the body or when carbon dioxide is not passing from the lungs out of the body.  Sepsis or septic shock. This is a serious bodily reaction to an infection.  Blood clotting problems.  Secondary infections due to bacteria or fungus.  Organ failure. This is when your body's organs stop working. The virus that causes COVID-19 is contagious. This means that it can spread from person to person through droplets from coughs and sneezes (respiratory secretions). What are the causes? This illness is caused by a virus. You may catch the virus by:   Diagnosis: COVID-19    Discharge: Discharged home   Kim Garcia 01/10/2020   Breathing in droplets from an infected person. Droplets can be spread by a person breathing, speaking, singing, coughing, or sneezing.  Touching something, like a table or a doorknob, that was exposed to the virus (contaminated) and then touching your mouth, nose, or eyes. What increases the risk? Risk for infection You are more likely to be infected with this virus if you:  Are within 6 feet (2 meters) of a person with COVID-19.  Provide care for or live with a person who is infected with COVID-19.  Spend time in crowded indoor spaces or live in shared  housing. Risk for serious illness You are more likely to become seriously ill from the virus if you:  Are 47 years of age or older. The higher your age, the more you are at risk for serious illness.  Live in a nursing home or long-term care facility.  Have cancer.  Have a long-term (chronic) disease such as: ? Chronic lung disease, including chronic obstructive pulmonary disease or asthma. ? A long-term disease that lowers your body's ability to fight infection (immunocompromised). ? Heart disease, including heart failure, a condition in which the arteries that lead to the heart become narrow or blocked (coronary artery disease), a disease which makes the heart muscle thick, weak, or stiff (cardiomyopathy). ? Diabetes. ? Chronic kidney disease. ? Sickle cell disease, a condition in which red blood cells have an abnormal "sickle" shape. ? Liver disease.  Are obese. What are the signs or symptoms? Symptoms of this condition can range from mild to severe. Symptoms may appear any time from 2 to 14 days after being exposed to the virus. They include:  A fever or chills.  A cough.  Difficulty breathing.  Headaches, body aches, or muscle aches.  Runny or stuffy (congested) nose.  A sore throat.  New loss of taste or smell. Some people may also have stomach problems, such as nausea, vomiting, or diarrhea. Other people may not have any symptoms of COVID-19. How is this diagnosed? This condition may be diagnosed based on:  Your signs and symptoms, especially if: ? You live in an area with a COVID-19  outbreak. ? You recently traveled to or from an area where the virus is common. ? You provide care for or live with a person who was diagnosed with COVID-19. ? You were exposed to a person who was diagnosed with COVID-19.  A physical exam.  Lab tests, which may include: ? Taking a sample of fluid from the back of your nose and throat (nasopharyngeal fluid), your nose, or your  throat using a swab. ? A sample of mucus from your lungs (sputum). ? Blood tests.  Imaging tests, which may include, X-rays, CT scan, or ultrasound. How is this treated? At present, there is no medicine to treat COVID-19. Medicines that treat other diseases are being used on a trial basis to see if they are effective against COVID-19. Your health care provider will talk with you about ways to treat your symptoms. For most people, the infection is mild and can be managed at home with rest, fluids, and over-the-counter medicines. Treatment for a serious infection usually takes places in a hospital intensive care unit (ICU). It may include one or more of the following treatments. These treatments are given until your symptoms improve.  Receiving fluids and medicines through an IV.  Supplemental oxygen. Extra oxygen is given through a tube in the nose, a face mask, or a hood.  Positioning you to lie on your stomach (prone position). This makes it easier for oxygen to get into the lungs.  Continuous positive airway pressure (CPAP) or bi-level positive airway pressure (BPAP) machine. This treatment uses mild air pressure to keep the airways open. A tube that is connected to a motor delivers oxygen to the body.  Ventilator. This treatment moves air into and out of the lungs by using a tube that is placed in your windpipe.  Tracheostomy. This is a procedure to create a hole in the neck so that a breathing tube can be inserted.  Extracorporeal membrane oxygenation (ECMO). This procedure gives the lungs a chance to recover by taking over the functions of the heart and lungs. It supplies oxygen to the body and removes carbon dioxide. Follow these instructions at home: Lifestyle  If you are sick, stay home except to get medical care. Your health care provider will tell you how long to stay home. Call your health care provider before you go for medical care.  Rest at home as told by your health care  provider.  Do not use any products that contain nicotine or tobacco, such as cigarettes, e-cigarettes, and chewing tobacco. If you need help quitting, ask your health care provider.  Return to your normal activities as told by your health care provider. Ask your health care provider what activities are safe for you. General instructions  Take over-the-counter and prescription medicines only as told by your health care provider.  Drink enough fluid to keep your urine pale yellow.  Keep all follow-up visits as told by your health care provider. This is important. How is this prevented?  There is no vaccine to help prevent COVID-19 infection. However, there are steps you can take to protect yourself and others from this virus. To protect yourself:   Do not travel to areas where COVID-19 is a risk. The areas where COVID-19 is reported change often. To identify high-risk areas and travel restrictions, check the CDC travel website: FatFares.com.br  If you live in, or must travel to, an area where COVID-19 is a risk, take precautions to avoid infection. ? Stay away from people who are  sick. ? Wash your hands often with soap and water for 20 seconds. If soap and water are not available, use an alcohol-based hand sanitizer. ? Avoid touching your mouth, face, eyes, or nose. ? Avoid going out in public, follow guidance from your state and local health authorities. ? If you must go out in public, wear a cloth face covering or face mask. Make sure your mask covers your nose and mouth. ? Avoid crowded indoor spaces. Stay at least 6 feet (2 meters) away from others. ? Disinfect objects and surfaces that are frequently touched every day. This may include:  Counters and tables.  Doorknobs and light switches.  Sinks and faucets.  Electronics, such as phones, remote controls, keyboards, computers, and tablets. To protect others: If you have symptoms of COVID-19, take steps to prevent  the virus from spreading to others.  If you think you have a COVID-19 infection, contact your health care provider right away. Tell your health care team that you think you may have a COVID-19 infection.  Stay home. Leave your house only to seek medical care. Do not use public transport.  Do not travel while you are sick.  Wash your hands often with soap and water for 20 seconds. If soap and water are not available, use alcohol-based hand sanitizer.  Stay away from other members of your household. Let healthy household members care for children and pets, if possible. If you have to care for children or pets, wash your hands often and wear a mask. If possible, stay in your own room, separate from others. Use a different bathroom.  Make sure that all people in your household wash their hands well and often.  Cough or sneeze into a tissue or your sleeve or elbow. Do not cough or sneeze into your hand or into the air.  Wear a cloth face covering or face mask. Make sure your mask covers your nose and mouth. Where to find more information  Centers for Disease Control and Prevention: StickerEmporium.tn  World Health Organization: https://thompson-craig.com/ Contact a health care provider if:  You live in or have traveled to an area where COVID-19 is a risk and you have symptoms of the infection.  You have had contact with someone who has COVID-19 and you have symptoms of the infection. Get help right away if:  You have trouble breathing.  You have pain or pressure in your chest.  You have confusion.  You have bluish lips and fingernails.  You have difficulty waking from sleep.  You have symptoms that get worse. These symptoms may represent a serious problem that is an emergency. Do not wait to see if the symptoms will go away. Get medical help right away. Call your local emergency services (911 in the [place] Do not drive yourself to the hospital.  Let the emergency medical personnel know if you think you have COVID-19. Summary  COVID-19 is a respiratory infection that is caused by a virus. It is also known as coronavirus disease or novel coronavirus. It can cause serious infections, such as pneumonia, acute respiratory distress syndrome, acute respiratory failure, or sepsis.  The virus that causes COVID-19 is contagious. This means that it can spread from person to person through droplets from breathing, speaking, singing, coughing, or sneezing.  You are more likely to develop a serious illness if you are 45 years of age or older, have a weak immune system, live in a nursing home, or have chronic disease.  There is no medicine  to treat COVID-19. Your health care provider will talk with you about ways to treat your symptoms.  Take steps to protect yourself and others from infection. Wash your hands often and disinfect objects and surfaces that are frequently touched every day. Stay away from people who are sick and wear a mask if you are sick. This information is not intended to replace advice given to you by your health care provider. Make sure you discuss any questions you have with your health care provider. Document Revised: 09/28/2019 Document Reviewed: 01/04/2019 Elsevier Patient Education  Kim Garcia.

## 2020-01-10 NOTE — Progress Notes (Signed)
Patient ID: Kim Garcia, female   DOB: 07-Nov-1986, 34 y.o.   MRN: [id number]   Diagnosis: COVID-19  Physician: Delford Field  Procedure: [facility] Med: bamlanivimab infusion - Provided patient with bamlanimivab fact sheet for patients, parents and caregivers prior to infusion.  Complications: No immediate complications noted.  Discharge: Discharged home   Kim Garcia 01/10/2020

## 2020-04-02 ENCOUNTER — Ambulatory Visit: Payer: Managed Care, Other (non HMO) | Admitting: Family Medicine

## 2020-04-18 ENCOUNTER — Encounter: Payer: Self-pay | Admitting: Family Medicine

## 2020-04-18 ENCOUNTER — Ambulatory Visit: Payer: Managed Care, Other (non HMO) | Admitting: Family Medicine

## 2020-04-18 ENCOUNTER — Other Ambulatory Visit: Payer: Self-pay

## 2020-04-18 DIAGNOSIS — F329 Major depressive disorder, single episode, unspecified: Secondary | ICD-10-CM

## 2020-04-18 DIAGNOSIS — F32A Depression, unspecified: Secondary | ICD-10-CM

## 2020-04-18 DIAGNOSIS — F419 Anxiety disorder, unspecified: Secondary | ICD-10-CM | POA: Diagnosis not present

## 2020-04-18 MED ORDER — ESCITALOPRAM OXALATE 20 MG PO TABS: 20.0000 mg | ORAL_TABLET | Freq: Every day | ORAL | 1 refills | Status: DC

## 2020-04-18 MED ORDER — PHENTERMINE HCL 37.5 MG PO TABS: 37.5000 mg | ORAL_TABLET | Freq: Every day | ORAL | 0 refills | Status: DC

## 2020-04-18 NOTE — Patient Instructions (Signed)
Nice to see you. Please continue to work on diet and exercise.  I will refill the Adipex.  We will see you in 6 weeks.

## 2020-04-18 NOTE — Progress Notes (Signed)
  [name], MD Phone: [phone]  Kim Garcia is a 34 y.o. female who presents today for follow-up.  Morbid obesity: Patient has been sticking to a 1500-1600-calorie diet.  She is making better choices with smaller portions.  She is doing something to exercise daily with walking or dance workout.  She has been on Adipex over the last 6 or so weeks.  She notes that does suppress her appetite.  She notes no chest pain or palpitations.  No snacking.  She is down about 16 pounds on her home scale.  She notes she has had to buy new clothes as her prior clothing was getting too big.  Anxiety/depression: Patient notes continuing to have some issues with anxiety.  She has noted she has been picking at her skin so she does still think it is an issue.  She is sleeping better.  She is crying less.  Her depression is not as bad.  No SI.  She wonders about increasing the dose of the Lexapro.  Social History   Tobacco Use  Smoking Status Never Smoker  Smokeless Tobacco Never Used     ROS see history of present illness  Objective  Physical Exam Vitals:   04/18/20 1551  BP: 120/80  Pulse: 88  Temp: 97.8 F (36.6 C)  SpO2: 99%    BP Readings from Last 3 Encounters:  04/18/20 120/80  01/10/20 122/76  01/06/20 125/86   Wt Readings from Last 3 Encounters:  04/18/20 263 lb 3.2 oz (119.4 kg)  01/06/20 260 lb (117.9 kg)  01/03/20 265 lb (120.2 kg)    Physical Exam Constitutional:      General: She is not in acute distress.    Appearance: She is not diaphoretic.  Cardiovascular:     Rate and Rhythm: Normal rate and regular rhythm.     Heart sounds: Normal heart sounds.  Pulmonary:     Effort: Pulmonary effort is normal.     Breath sounds: Normal breath sounds.  Skin:    General: Skin is warm and dry.  Neurological:     Mental Status: She is alert.      Assessment/Plan: Please see individual problem list.  Morbid obesity (HCC) Encouraged continued diet and exercise  changes.  Discussed that we could continue the Adipex for another 6 weeks though it does have a maximum 12-week duration.  We will follow-up around that time.  Anxiety and depression Improved though still having some issues with anxiety and depression.  We will increase her Lexapro.  Discussed seeking medical attention the emergency room if she developed SI.    No orders of the defined types were placed in this encounter.   Meds ordered this encounter  Medications  . phentermine (ADIPEX-P) 37.5 MG tablet    Sig: Take 1 tablet (37.5 mg total) by mouth daily before breakfast.    Dispense:  45 tablet    Refill:  0  . escitalopram (LEXAPRO) 20 MG tablet    Sig: Take 1 tablet (20 mg total) by mouth daily.    Dispense:  90 tablet    Refill:  1    This visit occurred during the SARS-CoV-2 public health emergency.  Safety protocols were in place, including screening questions prior to the visit, additional usage of staff PPE, and extensive cleaning of exam room while observing appropriate contact time as indicated for disinfecting solutions.    [name], MD [facility]

## 2020-04-18 NOTE — Assessment & Plan Note (Signed)
Improved though still having some issues with anxiety and depression.  We will increase her Lexapro.  Discussed seeking medical attention the emergency room if she developed SI.

## 2020-04-18 NOTE — Assessment & Plan Note (Signed)
Encouraged continued diet and exercise changes.  Discussed that we could continue the Adipex for another 6 weeks though it does have a maximum 12-week duration.  We will follow-up around that time.

## 2020-05-21 ENCOUNTER — Other Ambulatory Visit: Payer: Self-pay | Admitting: Family Medicine

## 2020-05-30 ENCOUNTER — Encounter: Payer: Self-pay | Admitting: Family Medicine

## 2020-05-30 ENCOUNTER — Other Ambulatory Visit: Payer: Self-pay

## 2020-05-30 ENCOUNTER — Ambulatory Visit: Payer: Managed Care, Other (non HMO) | Admitting: Family Medicine

## 2020-05-30 DIAGNOSIS — F419 Anxiety disorder, unspecified: Secondary | ICD-10-CM

## 2020-05-30 DIAGNOSIS — F329 Major depressive disorder, single episode, unspecified: Secondary | ICD-10-CM | POA: Diagnosis not present

## 2020-05-30 DIAGNOSIS — F32A Depression, unspecified: Secondary | ICD-10-CM

## 2020-05-30 NOTE — Assessment & Plan Note (Signed)
Continues to have some anxiety issues.  Offered support regarding her home situation.  She will continue her Lexapro.

## 2020-05-30 NOTE — Patient Instructions (Signed)
Nice to see you. We will refer you to the weight loss clinic in [place]. Please continue your Lexapro. Please let us know if you need anything between now and your next visit.

## 2020-05-30 NOTE — Assessment & Plan Note (Signed)
Refer to weight loss clinic in [place].  She will finish her current supply of phentermine.  Continue diet and exercise.

## 2020-05-30 NOTE — Progress Notes (Signed)
  [name], MD Phone: [phone]  Kim Garcia is a 34 y.o. female who presents today for f/u.  Obesity: Patient has been taking phentermine.  Her diet has not been quite as good the last couple of weeks related to stress at home.  She has been doing meal plans and tries not to buy extra things at the grocery store.  She has been doing some exercise daily.  Walking some.  Does a dance workout with her kids.  She feels as though she does not get full signals.  Anxiety: The anxiety is gotten better though she has had a lot of stress at home as her and her husband are having some difficulty.  She feels he may be dealing with some depression and has been pulling away from her to a certain degree.  He does get angry though has never been physical with her or the children.  She does feel safe at home with him.  Patient continues on Lexapro.  Social History   Tobacco Use  Smoking Status Never Smoker  Smokeless Tobacco Never Used     ROS see history of present illness  Objective  Physical Exam Vitals:   05/30/20 1545  BP: 130/80  Pulse: 96  Temp: 98 F (36.7 C)  SpO2: 98%    BP Readings from Last 3 Encounters:  05/30/20 130/80  04/18/20 120/80  01/10/20 122/76   Wt Readings from Last 3 Encounters:  05/30/20 259 lb 6.4 oz (117.7 kg)  04/18/20 263 lb 3.2 oz (119.4 kg)  01/06/20 260 lb (117.9 kg)    Physical Exam Constitutional:      General: She is not in acute distress.    Appearance: She is not diaphoretic.  Cardiovascular:     Rate and Rhythm: Normal rate and regular rhythm.     Heart sounds: Normal heart sounds.  Pulmonary:     Effort: Pulmonary effort is normal.     Breath sounds: Normal breath sounds.  Skin:    General: Skin is warm and dry.  Neurological:     Mental Status: She is alert.  Psychiatric:     Comments: Intermittently tearful      Assessment/Plan: Please see individual problem list.  Morbid obesity (HCC) Refer to weight loss clinic in  [place].  She will finish her current supply of phentermine.  Continue diet and exercise.  Anxiety and depression Continues to have some anxiety issues.  Offered support regarding her home situation.  She will continue her Lexapro.   No orders of the defined types were placed in this encounter.   No orders of the defined types were placed in this encounter.   This visit occurred during the SARS-CoV-2 public health emergency.  Safety protocols were in place, including screening questions prior to the visit, additional usage of staff PPE, and extensive cleaning of exam room while observing appropriate contact time as indicated for disinfecting solutions.    [name], MD [facility]

## 2020-06-18 ENCOUNTER — Other Ambulatory Visit: Payer: Self-pay

## 2020-06-18 ENCOUNTER — Encounter (INDEPENDENT_AMBULATORY_CARE_PROVIDER_SITE_OTHER): Payer: Self-pay | Admitting: Family Medicine

## 2020-06-18 ENCOUNTER — Ambulatory Visit (INDEPENDENT_AMBULATORY_CARE_PROVIDER_SITE_OTHER): Payer: Managed Care, Other (non HMO) | Admitting: Family Medicine

## 2020-06-18 VITALS — BP 132/76 | HR 77 | Temp 98.1°F | Ht 62.0 in | Wt 256.0 lb

## 2020-06-18 DIAGNOSIS — Z6841 Body Mass Index (BMI) 40.0 and over, adult: Secondary | ICD-10-CM

## 2020-06-18 DIAGNOSIS — Z0289 Encounter for other administrative examinations: Secondary | ICD-10-CM

## 2020-06-18 DIAGNOSIS — Z9189 Other specified personal risk factors, not elsewhere classified: Secondary | ICD-10-CM

## 2020-06-18 DIAGNOSIS — F329 Major depressive disorder, single episode, unspecified: Secondary | ICD-10-CM

## 2020-06-18 DIAGNOSIS — G4733 Obstructive sleep apnea (adult) (pediatric): Secondary | ICD-10-CM | POA: Diagnosis not present

## 2020-06-18 DIAGNOSIS — R0602 Shortness of breath: Secondary | ICD-10-CM

## 2020-06-18 DIAGNOSIS — R5383 Other fatigue: Secondary | ICD-10-CM

## 2020-06-18 DIAGNOSIS — E559 Vitamin D deficiency, unspecified: Secondary | ICD-10-CM

## 2020-06-18 DIAGNOSIS — F419 Anxiety disorder, unspecified: Secondary | ICD-10-CM

## 2020-06-19 LAB — COMPREHENSIVE METABOLIC PANEL
ALT: 13 IU/L (ref 0–32)
AST: 17 IU/L (ref 0–40)
Albumin/Globulin Ratio: 1.4 (ref 1.2–2.2)
Albumin: 4.3 g/dL (ref 3.8–4.8)
Alkaline Phosphatase: 124 IU/L — ABNORMAL HIGH (ref 48–121)
BUN/Creatinine Ratio: 11 (ref 9–23)
BUN: 8 mg/dL (ref 6–20)
Bilirubin Total: 0.3 mg/dL (ref 0.0–1.2)
CO2: 25 mmol/L (ref 20–29)
Calcium: 9.4 mg/dL (ref 8.7–10.2)
Chloride: 104 mmol/L (ref 96–106)
Creatinine, Ser: 0.72 mg/dL (ref 0.57–1.00)
GFR calc Af Amer: 126 mL/min/{1.73_m2} (ref >59)
GFR calc non Af Amer: 110 mL/min/{1.73_m2} (ref >59)
Globulin, Total: 3 g/dL (ref 1.5–4.5)
Glucose: 79 mg/dL (ref 65–99)
Potassium: 4.2 mmol/L (ref 3.5–5.2)
Sodium: 141 mmol/L (ref 134–144)
Total Protein: 7.3 g/dL (ref 6.0–8.5)

## 2020-06-19 LAB — CBC WITH DIFFERENTIAL/PLATELET
Basophils Absolute: 0 10*3/uL (ref 0.0–0.2)
Basos: 0 %
EOS (ABSOLUTE): 0.3 10*3/uL (ref 0.0–0.4)
Eos: 5 %
Hematocrit: 42.5 % (ref 34.0–46.6)
Hemoglobin: 13.4 g/dL (ref 11.1–15.9)
Immature Grans (Abs): 0.1 10*3/uL (ref 0.0–0.1)
Immature Granulocytes: 1 %
Lymphocytes Absolute: 1.9 10*3/uL (ref 0.7–3.1)
Lymphs: 29 %
MCH: 26.9 pg (ref 26.6–33.0)
MCHC: 31.5 g/dL (ref 31.5–35.7)
MCV: 85 fL (ref 79–97)
Monocytes Absolute: 0.4 10*3/uL (ref 0.1–0.9)
Monocytes: 6 %
Neutrophils Absolute: 4.1 10*3/uL (ref 1.4–7.0)
Neutrophils: 59 %
Platelets: 226 10*3/uL (ref 150–450)
RBC: 4.99 x10E6/uL (ref 3.77–5.28)
RDW: 13.1 % (ref 11.7–15.4)
WBC: 6.8 10*3/uL (ref 3.4–10.8)

## 2020-06-19 LAB — LIPID PANEL WITH LDL/HDL RATIO
Cholesterol, Total: 171 mg/dL (ref 100–199)
HDL: 39 mg/dL — ABNORMAL LOW (ref >39)
LDL Chol Calc (NIH): 107 mg/dL — ABNORMAL HIGH (ref 0–99)
LDL/HDL Ratio: 2.7 ratio (ref 0.0–3.2)
Triglycerides: 139 mg/dL (ref 0–149)
VLDL Cholesterol Cal: 25 mg/dL (ref 5–40)

## 2020-06-19 LAB — VITAMIN D 25 HYDROXY (VIT D DEFICIENCY, FRACTURES): Vit D, 25-Hydroxy: 20.7 ng/mL — ABNORMAL LOW (ref 30.0–100.0)

## 2020-06-19 LAB — HEMOGLOBIN A1C
Est. average glucose Bld gHb Est-mCnc: 100 mg/dL
Hgb A1c MFr Bld: 5.1 % (ref 4.8–5.6)

## 2020-06-19 LAB — FOLATE: Folate: 3.8 ng/mL (ref >3.0)

## 2020-06-19 LAB — T4: T4, Total: 9.1 ug/dL (ref 4.5–12.0)

## 2020-06-19 LAB — TSH: TSH: 1.2 u[IU]/mL (ref 0.450–4.500)

## 2020-06-19 LAB — VITAMIN B12: Vitamin B-12: 369 pg/mL (ref 232–1245)

## 2020-06-19 LAB — INSULIN, RANDOM: INSULIN: 16.9 u[IU]/mL (ref 2.6–24.9)

## 2020-06-19 LAB — T3: T3, Total: 144 ng/dL (ref 71–180)

## 2020-06-23 NOTE — Progress Notes (Signed)
Dear Kim MaoEric G. Birdie SonsSonnenberg, MD,   Thank you for referring Kim Garcia to our clinic. The following note includes my evaluation and treatment recommendations.  Chief Complaint:   OBESITY Kim Garcia (MR# [id number]) is a 34 y.o. female who presents for evaluation and treatment of obesity and related comorbidities. Current BMI is Body mass index is 46.82 kg/m. Kim Garcia has been struggling with her weight for many years and has been unsuccessful in either losing weight, maintaining weight loss, or reaching her healthy weight goal.  Kim Garcia is currently in the action stage of change and ready to dedicate time achieving and maintaining a healthier weight. Kim Garcia is interested in becoming our patient and working on intensive lifestyle modifications including (but not limited to) diet and exercise for weight loss.  Kim Garcia's habits were reviewed today and are as follows: Her family eats meals together, she thinks her family will eat healthier with her, her desired weight loss is 76 lbs, she has been heavy most of her life, she started gaining weight during college and after birth, her heaviest weight ever was 280 pounds, she has significant food cravings issues, she snacks frequently in the evenings, she skips meals frequently, she is frequently drinking liquids with calories, she frequently makes poor food choices, she frequently eats larger portions than normal, she has binge eating behaviors and she struggles with emotional eating.  Depression Screen Kim Garcia's Food and Mood (modified PHQ-9) score was 17.  Depression screen PHQ 2/9 06/18/2020  Decreased Interest 1  Down, Depressed, Hopeless 3  PHQ - 2 Score 4  Altered sleeping 3  Tired, decreased energy 3  Change in appetite 2  Feeling bad or failure about yourself  1  Trouble concentrating 2  Moving slowly or fidgety/restless 2  Suicidal thoughts 0  PHQ-9 Score 17  Difficult doing work/chores Not difficult at all   Subjective:   1. Other  fatigue Kim Garcia admits to daytime somnolence and denies waking up still tired. Patent has a history of symptoms of daytime fatigue. Kim Garcia generally gets 6 or 8 hours of sleep per night, and states that she has generally restful sleep. Snoring is present. Apneic episodes are not present. Epworth Sleepiness Score is 8. EKG-normal sinus rhythm at 80 BPM with T wave inversion in III and VI.  2. SOB (shortness of breath) on exertion Kim Garcia notes increasing shortness of breath with exercising and seems to be worsening over time with weight gain. She notes getting out of breath sooner with activity than she used to. This has not gotten worse recently. Kim Garcia denies shortness of breath at rest or orthopnea.  3. OSA (obstructive sleep apnea) Kim Garcia is on CPAP, and she is not wearing CPAP 2 times per week. She notes sleep has significantly improved.  4. Anxiety and depression Kim Garcia has been on Lexapro 20 mg for 30 months. Her symptoms are better controlled.  5. Vitamin D deficiency Kim Garcia is not on Vit D, and she notes fatigue.  6. At risk for osteoporosis Kim Garcia is at higher risk of osteopenia and osteoporosis due to Vitamin D deficiency.   Assessment/Plan:   1. Other fatigue Kim Garcia does feel that her weight is causing her energy to be lower than it should be. Fatigue may be related to obesity, depression or many other causes. Labs will be ordered, and in the meanwhile, Kim Garcia will focus on self care including making healthy food choices, increasing physical activity and focusing on stress reduction.  - EKG 12-Lead - Comprehensive  metabolic panel - CBC with Differential/Platelet - Hemoglobin A1c - Insulin, random - Lipid Panel With LDL/HDL Ratio - Vitamin B12 - Folate - T3 - T4 - TSH  2. SOB (shortness of breath) on exertion Kim Garcia does feel that she gets out of breath more easily that she used to when she exercises. Kim Garcia's shortness of breath appears to be obesity related and exercise induced. She has agreed  to work on weight loss and gradually increase exercise to treat her exercise induced shortness of breath. Will continue to monitor closely.  - Comprehensive metabolic panel - CBC with Differential/Platelet - Hemoglobin A1c - Insulin, random - Lipid Panel With LDL/HDL Ratio - Vitamin B12 - Folate - T3 - T4 - TSH  3. OSA (obstructive sleep apnea) Intensive lifestyle modifications are the first line treatment for this issue. We discussed several lifestyle modifications today and she will continue to work on diet, exercise and weight loss efforts. Kim Garcia will continues to follow up with Pulmonology. We will continue to monitor. Orders and follow up as documented in patient record.   Counseling  Sleep apnea is a condition in which breathing pauses or becomes shallow during sleep. This happens over and over during the night. This disrupts your sleep and keeps your body from getting the rest that it needs, which can cause tiredness and lack of energy (fatigue) during the day.  Sleep apnea treatment: If you were given a device to open your airway while you sleep, USE IT!  Sleep hygiene:   Limit or avoid alcohol, caffeinated beverages, and cigarettes, especially close to bedtime.   Do not eat a large meal or eat spicy foods right before bedtime. This can lead to digestive discomfort that can make it hard for you to sleep.  Keep a sleep diary to help you and your health care provider figure out what could be causing your insomnia.   Make your bedroom a dark, comfortable place where it is easy to fall asleep. ? Put up shades or blackout curtains to block light from outside. ? Use a white noise machine to block noise. ? Keep the temperature cool.  Limit screen use before bedtime. This includes: ? Watching TV. ? Using your smartphone, tablet, or computer.  Stick to a routine that includes going to bed and waking up at the same times every day and night. This can help you fall asleep faster.  Consider making a quiet activity, such as reading, part of your nighttime routine.  Try to avoid taking naps during the day so that you sleep better at night.  Get out of bed if you are still awake after 15 minutes of trying to sleep. Keep the lights down, but try reading or doing a quiet activity. When you feel sleepy, go back to bed.  4. Anxiety and depression Behavior modification techniques were discussed today to help Kim Garcia deal with her anxiety. We will follow up at her next appointment. Orders and follow up as documented in patient record.   5. Vitamin D deficiency Low Vitamin D level contributes to fatigue and are associated with obesity, breast, and colon cancer. We will check labs today, and Kim Garcia will follow-up for routine testing of Vitamin D, at least 2-3 times per year to avoid over-replacement.  - VITAMIN D 25 Hydroxy (Vit-D Deficiency, Fractures)  6. At risk for osteoporosis Kim Garcia was given approximately 15 minutes of osteoporosis prevention counseling today. Kim Garcia is at risk for osteopenia and osteoporosis due to her Vitamin D deficiency. She  was encouraged to take her Vitamin D and follow her higher calcium diet and increase strengthening exercise to help strengthen her bones and decrease her risk of osteopenia and osteoporosis.  Repetitive spaced learning was employed today to elicit superior memory formation and behavioral change.  7. Class 3 severe obesity with serious comorbidity and body mass index (BMI) of 45.0 to 49.9 in adult, unspecified obesity type [facility]) Kim Garcia is currently in the action stage of change and her goal is to continue with weight loss efforts. I recommend Kim Garcia begin the structured treatment plan as follows:  She has agreed to the Category 3 Plan.  Exercise goals: No exercise has been prescribed at this time.   Behavioral modification strategies: increasing lean protein intake, increasing vegetables, meal planning and cooking strategies and keeping healthy  foods in the home.  She was informed of the importance of frequent follow-up visits to maximize her success with intensive lifestyle modifications for her multiple health conditions. She was informed we would discuss her lab results at her next visit unless there is a critical issue that needs to be addressed sooner. Kim Garcia agreed to keep her next visit at the agreed upon time to discuss these results.  Objective:   Blood pressure 132/76, pulse 77, temperature 98.1 F (36.7 C), temperature source Oral, height 5\' 2"  (1.575 m), weight 256 lb (116.1 kg), last menstrual period 06/12/2020, SpO2 94 %. Body mass index is 46.82 kg/m.  EKG: Normal sinus rhythm, rate 80 BPM.  Indirect Calorimeter completed today shows a VO2 of 341 and a REE of 2374.  Her calculated basal metabolic rate is 2375 thus her basal metabolic rate is better than expected.  General: Cooperative, alert, well developed, in no acute distress. HEENT: Conjunctivae and lids unremarkable. Cardiovascular: Regular rhythm.  Lungs: Normal work of breathing. Neurologic: No focal deficits.   Lab Results  Component Value Date   CREATININE 0.72 06/18/2020   BUN 8 06/18/2020   NA 141 06/18/2020   K 4.2 06/18/2020   CL 104 06/18/2020   CO2 25 06/18/2020   Lab Results  Component Value Date   ALT 13 06/18/2020   AST 17 06/18/2020   ALKPHOS 124 (H) 06/18/2020   BILITOT 0.3 06/18/2020   Lab Results  Component Value Date   HGBA1C 5.1 06/18/2020   HGBA1C 5.0 05/22/2018   Lab Results  Component Value Date   INSULIN 16.9 06/18/2020   Lab Results  Component Value Date   TSH 1.200 06/18/2020   Lab Results  Component Value Date   CHOL 171 06/18/2020   HDL 39 (L) 06/18/2020   LDLCALC 107 (H) 06/18/2020   TRIG 139 06/18/2020   Lab Results  Component Value Date   WBC 6.8 06/18/2020   HGB 13.4 06/18/2020   HCT 42.5 06/18/2020   MCV 85 06/18/2020   PLT 226 06/18/2020   Lab Results  Component Value Date   IRON 34  05/22/2018   TIBC 340 05/22/2018   FERRITIN 32 05/22/2018   Attestation Statements:   This is the patient's first visit at [facility]. The patient's NEW PATIENT PACKET was reviewed at length. Included in the packet: current and past health history, medications, allergies, ROS, gynecologic history (women only), surgical history, family history, social history, weight history, weight loss surgery history (for those that have had weight loss surgery), nutritional evaluation, mood and food questionnaire, PHQ9, Epworth questionnaire, sleep habits questionnaire, patient life and health improvement goals questionnaire. These will all be scanned into  the patient's chart under media.   During the visit, I independently reviewed the patient's EKG, bioimpedance scale results, and indirect calorimeter results. I used this information to tailor a meal plan for the patient that will help her to lose weight and will improve her obesity-related conditions going forward. I performed a medically necessary appropriate examination and/or evaluation. I discussed the assessment and treatment plan with the patient. The patient was provided an opportunity to ask questions and all were answered. The patient agreed with the plan and demonstrated an understanding of the instructions. Labs were ordered at this visit and will be reviewed at the next visit unless more critical results need to be addressed immediately. Clinical information was updated and documented in the EMR.   Time spent on visit including pre-visit chart review and post-visit care was 46 minutes.   A separate 15 minutes was spent on risk counseling (see above).    I, Burt Knack, am acting as transcriptionist for Reuben Likes, MD.  I have reviewed the above documentation for accuracy and completeness, and I agree with the above. - Katherina Mires, MD

## 2020-07-02 ENCOUNTER — Ambulatory Visit (INDEPENDENT_AMBULATORY_CARE_PROVIDER_SITE_OTHER): Payer: Managed Care, Other (non HMO) | Admitting: Family Medicine

## 2020-07-02 ENCOUNTER — Other Ambulatory Visit: Payer: Self-pay

## 2020-07-02 ENCOUNTER — Encounter (INDEPENDENT_AMBULATORY_CARE_PROVIDER_SITE_OTHER): Payer: Self-pay | Admitting: Family Medicine

## 2020-07-02 VITALS — BP 129/87 | HR 78 | Temp 98.1°F | Ht 62.0 in | Wt 253.0 lb

## 2020-07-02 DIAGNOSIS — E8881 Metabolic syndrome: Secondary | ICD-10-CM | POA: Diagnosis not present

## 2020-07-02 DIAGNOSIS — E559 Vitamin D deficiency, unspecified: Secondary | ICD-10-CM

## 2020-07-02 DIAGNOSIS — Z6841 Body Mass Index (BMI) 40.0 and over, adult: Secondary | ICD-10-CM

## 2020-07-02 DIAGNOSIS — Z9189 Other specified personal risk factors, not elsewhere classified: Secondary | ICD-10-CM | POA: Diagnosis not present

## 2020-07-02 MED ORDER — VITAMIN D (ERGOCALCIFEROL) 1.25 MG (50000 UNIT) PO CAPS: 50000.0000 [IU] | ORAL_CAPSULE | ORAL | 0 refills | Status: DC

## 2020-07-03 NOTE — Progress Notes (Signed)
Chief Complaint:   OBESITY Kim Garcia is here to discuss her progress with her obesity treatment plan along with follow-up of her obesity related diagnoses. Kim Garcia is on the Category 3 Plan and states she is following her eating plan approximately 80% of the time. [name] states she is walking 2 miles and doing squats for 10 minutes 4-5 times per week.  Today's visit was #: 2 Starting weight: 256 lbs Starting date: 06/18/2020 Today's weight: 253 lbs Today's date: 07/02/2020 Total lbs lost to date: 3 Total lbs lost since last in-office visit: 3  Interim History: Kim Garcia felt it was hard to eat everything on the plan. A few days she felt sick from eating everything. She made replacements for things already in her house. She did eat out more the past week due to busier schedule.  Subjective:   1. Vitamin D deficiency [name]'s Vit D level is 20.7. She notes fatigue and she is not on Vit D replacement. I discussed labs with the patient today.  2. Insulin resistance [name]'s A1c is 5.1 and insulin 16.9. She is not on medications, and she notes occasional carbohydrate cravings. I discussed labs with the patient today.  3. At risk for diabetes mellitus Kim Garcia is at higher than average risk for developing diabetes due to her obesity.   Assessment/Plan:   1. Vitamin D deficiency Low Vitamin D level contributes to fatigue and are associated with obesity, breast, and colon cancer. Tien agreed to start prescription Vitamin D 50,000 IU every week with no refills. She will follow-up for routine testing of Vitamin D, at least 2-3 times per year to avoid over-replacement.  - Vitamin D, Ergocalciferol, (DRISDOL) 1.25 MG (50000 UNIT) CAPS capsule; Take 1 capsule (50,000 Units total) by mouth every 7 (seven) days.  Dispense: 4 capsule; Refill: 0  2. Insulin resistance Kim Garcia will continue to work on weight loss, exercise, and decreasing simple carbohydrates to help decrease the risk of diabetes. We will repeat labs in  3 months. Kim Garcia agreed to follow-up with [name] as directed to closely monitor her progress.  3. At risk for diabetes mellitus Kim Garcia was given approximately 30 minutes of diabetes education and counseling today. We discussed intensive lifestyle modifications today with an emphasis on weight loss as well as increasing exercise and decreasing simple carbohydrates in her diet. We also reviewed medication options with an emphasis on risk versus benefit of those discussed.   Repetitive spaced learning was employed today to elicit superior memory formation and behavioral change.  4. Class 3 severe obesity with serious comorbidity and body mass index (BMI) of 45.0 to 49.9 in adult, unspecified obesity type Kim Garcia) [name] is currently in the action stage of change. As such, her goal is to continue with weight loss efforts. She has agreed to the Category 3 Plan and keeping a food journal and adhering to recommended goals of 450-600 calories and 40+ grams of protein at supper daily.   Exercise goals: All adults should avoid inactivity. Some physical activity is better than none, and adults who participate in any amount of physical activity gain some health benefits.  Behavioral modification strategies: increasing lean protein intake and meal planning and cooking strategies.  Kim Garcia has agreed to follow-up with our clinic in 2 weeks. She was informed of the importance of frequent follow-up visits to maximize her success with intensive lifestyle modifications for her multiple health conditions.   Objective:   Blood pressure 129/87, pulse 78, temperature 98.1 F (36.7 C), temperature source  Oral, height 5\' 2"  (1.575 m), weight 253 lb (114.8 kg), last menstrual period 06/12/2020, SpO2 97 %. Body mass index is 46.27 kg/m.  General: Cooperative, alert, well developed, in no acute distress. HEENT: Conjunctivae and lids unremarkable. Cardiovascular: Regular rhythm.  Lungs: Normal work of breathing. Neurologic: No focal  deficits.   Lab Results  Component Value Date   CREATININE 0.72 06/18/2020   BUN 8 06/18/2020   NA 141 06/18/2020   K 4.2 06/18/2020   CL 104 06/18/2020   CO2 25 06/18/2020   Lab Results  Component Value Date   ALT 13 06/18/2020   AST 17 06/18/2020   ALKPHOS 124 (H) 06/18/2020   BILITOT 0.3 06/18/2020   Lab Results  Component Value Date   HGBA1C 5.1 06/18/2020   HGBA1C 5.0 05/22/2018   Lab Results  Component Value Date   INSULIN 16.9 06/18/2020   Lab Results  Component Value Date   TSH 1.200 06/18/2020   Lab Results  Component Value Date   CHOL 171 06/18/2020   HDL 39 (L) 06/18/2020   LDLCALC 107 (H) 06/18/2020   TRIG 139 06/18/2020   Lab Results  Component Value Date   WBC 6.8 06/18/2020   HGB 13.4 06/18/2020   HCT 42.5 06/18/2020   MCV 85 06/18/2020   PLT 226 06/18/2020   Lab Results  Component Value Date   IRON 34 05/22/2018   TIBC 340 05/22/2018   FERRITIN 32 05/22/2018   Attestation Statements:   Reviewed by clinician on day of visit: allergies, medications, problem list, medical history, surgical history, family history, social history, and previous encounter notes.   I, 07/22/2018, am acting as transcriptionist for Burt Knack, MD.  I have reviewed the above documentation for accuracy and completeness, and I agree with the above. - Reuben Likes, MD

## 2020-07-05 ENCOUNTER — Other Ambulatory Visit: Payer: Self-pay | Admitting: Family Medicine

## 2020-07-16 ENCOUNTER — Encounter (INDEPENDENT_AMBULATORY_CARE_PROVIDER_SITE_OTHER): Payer: Self-pay | Admitting: Physician Assistant

## 2020-07-16 ENCOUNTER — Other Ambulatory Visit: Payer: Self-pay

## 2020-07-16 ENCOUNTER — Ambulatory Visit (INDEPENDENT_AMBULATORY_CARE_PROVIDER_SITE_OTHER): Payer: Managed Care, Other (non HMO) | Admitting: Physician Assistant

## 2020-07-16 VITALS — BP 139/87 | HR 81 | Temp 98.2°F | Ht 62.0 in | Wt 255.0 lb

## 2020-07-16 DIAGNOSIS — E559 Vitamin D deficiency, unspecified: Secondary | ICD-10-CM | POA: Diagnosis not present

## 2020-07-16 DIAGNOSIS — E7849 Other hyperlipidemia: Secondary | ICD-10-CM | POA: Diagnosis not present

## 2020-07-16 DIAGNOSIS — Z6841 Body Mass Index (BMI) 40.0 and over, adult: Secondary | ICD-10-CM

## 2020-07-16 NOTE — Progress Notes (Signed)
Chief Complaint:   OBESITY CZARINA GINGRAS is here to discuss her progress with her obesity treatment plan along with follow-up of her obesity related diagnoses. Chantee is on the Category 3 Plan and states she is following her eating plan approximately 85-90% of the time. Keyoni states she is walking 2 miles/dance videos 7 times per week.  Today's visit was #: 3 Starting weight: 256 lbs Starting date: 06/18/2020 Today's weight: 255 lbs Today's date: 07/16/2020 Total lbs lost to date: 1 Total lbs lost since last in-office visit: 0  Interim History: Matika reports that she has had stressful family events and has been stress eating. She did a good job meal planning lunch. She doesn't like cottage cheese or yogurt.  Subjective:   Vitamin D deficiency. Colena is on Vitamin D supplementation. No nausea, vomiting, or muscle weakness.    Ref. Range 06/18/2020 14:37  Vitamin D, 25-Hydroxy Latest Ref Range: 30.0 - 100.0 ng/mL 20.7 (L)   Other hyperlipidemia. Shauntae has hyperlipidemia and has been trying to improve her cholesterol levels with intensive lifestyle modification including a low saturated fat diet, exercise and weight loss. She denies any chest pain. Semiah is on no medication.  Lab Results  Component Value Date   ALT 13 06/18/2020   AST 17 06/18/2020   ALKPHOS 124 (H) 06/18/2020   BILITOT 0.3 06/18/2020   Lab Results  Component Value Date   CHOL 171 06/18/2020   HDL 39 (L) 06/18/2020   LDLCALC 107 (H) 06/18/2020   TRIG 139 06/18/2020   Assessment/Plan:   Vitamin D deficiency. Low Vitamin D level contributes to fatigue and are associated with obesity, breast, and colon cancer. She agrees to continue to take Vitamin D as directed and will follow-up for routine testing of Vitamin D, at least 2-3 times per year to avoid over-replacement.  Other hyperlipidemia. Cardiovascular risk and specific lipid/LDL goals reviewed.  We discussed several lifestyle modifications today and Yolando will  continue to work on diet, exercise and weight loss efforts. Orders and follow up as documented in patient record.   Counseling Intensive lifestyle modifications are the first line treatment for this issue. . Dietary changes: Increase soluble fiber. Decrease simple carbohydrates. . Exercise changes: Moderate to vigorous-intensity aerobic activity 150 minutes per week if tolerated. . Lipid-lowering medications: see documented in medical record.  Class 3 severe obesity with serious comorbidity and body mass index (BMI) of 45.0 to 49.9 in adult, unspecified obesity type (HCC).  Alexas is currently in the action stage of change. As such, her goal is to continue with weight loss efforts. She has agreed to the Category 3 Plan.   Exercise goals: For substantial health benefits, adults should do at least 150 minutes (2 hours and 30 minutes) a week of moderate-intensity, or 75 minutes (1 hour and 15 minutes) a week of vigorous-intensity aerobic physical activity, or an equivalent combination of moderate- and vigorous-intensity aerobic activity. Aerobic activity should be performed in episodes of at least 10 minutes, and preferably, it should be spread throughout the week.  Behavioral modification strategies: no skipping meals and meal planning and cooking strategies.  Telecia has agreed to follow-up with our clinic in 2 weeks. She was informed of the importance of frequent follow-up visits to maximize her success with intensive lifestyle modifications for her multiple health conditions.   Objective:   Blood pressure 139/87, pulse 81, temperature 98.2 F (36.8 C), temperature source Oral, height 5\' 2"  (1.575 m), weight 255 lb (115.7 kg),  last menstrual period 07/10/2020, SpO2 96 %. Body mass index is 46.64 kg/m.  General: Cooperative, alert, well developed, in no acute distress. HEENT: Conjunctivae and lids unremarkable. Cardiovascular: Regular rhythm.  Lungs: Normal work of breathing. Neurologic: No  focal deficits.   Lab Results  Component Value Date   CREATININE 0.72 06/18/2020   BUN 8 06/18/2020   NA 141 06/18/2020   K 4.2 06/18/2020   CL 104 06/18/2020   CO2 25 06/18/2020   Lab Results  Component Value Date   ALT 13 06/18/2020   AST 17 06/18/2020   ALKPHOS 124 (H) 06/18/2020   BILITOT 0.3 06/18/2020   Lab Results  Component Value Date   HGBA1C 5.1 06/18/2020   HGBA1C 5.0 05/22/2018   Lab Results  Component Value Date   INSULIN 16.9 06/18/2020   Lab Results  Component Value Date   TSH 1.200 06/18/2020   Lab Results  Component Value Date   CHOL 171 06/18/2020   HDL 39 (L) 06/18/2020   LDLCALC 107 (H) 06/18/2020   TRIG 139 06/18/2020   Lab Results  Component Value Date   WBC 6.8 06/18/2020   HGB 13.4 06/18/2020   HCT 42.5 06/18/2020   MCV 85 06/18/2020   PLT 226 06/18/2020   Lab Results  Component Value Date   IRON 34 05/22/2018   TIBC 340 05/22/2018   FERRITIN 32 05/22/2018   Attestation Statements:   Reviewed by clinician on day of visit: allergies, medications, problem list, medical history, surgical history, family history, social history, and previous encounter notes.  Time spent on visit including pre-visit chart review and post-visit charting and care was 30 minutes.   IMarianna Payment, am acting as transcriptionist for Alois Cliche, PA-C   I have reviewed the above documentation for accuracy and completeness, and I agree with the above. Alois Cliche, PA-C

## 2020-07-27 ENCOUNTER — Other Ambulatory Visit (INDEPENDENT_AMBULATORY_CARE_PROVIDER_SITE_OTHER): Payer: Self-pay | Admitting: Family Medicine

## 2020-07-27 DIAGNOSIS — E559 Vitamin D deficiency, unspecified: Secondary | ICD-10-CM

## 2020-08-07 ENCOUNTER — Ambulatory Visit (INDEPENDENT_AMBULATORY_CARE_PROVIDER_SITE_OTHER): Payer: Managed Care, Other (non HMO) | Admitting: Physician Assistant

## 2020-08-13 ENCOUNTER — Other Ambulatory Visit: Payer: Self-pay

## 2020-08-13 ENCOUNTER — Ambulatory Visit (INDEPENDENT_AMBULATORY_CARE_PROVIDER_SITE_OTHER): Payer: Managed Care, Other (non HMO) | Admitting: Physician Assistant

## 2020-08-13 ENCOUNTER — Encounter (INDEPENDENT_AMBULATORY_CARE_PROVIDER_SITE_OTHER): Payer: Self-pay | Admitting: Physician Assistant

## 2020-08-13 VITALS — BP 142/83 | HR 82 | Temp 97.6°F | Ht 62.0 in | Wt 257.0 lb

## 2020-08-13 DIAGNOSIS — E66813 Obesity, class 3: Secondary | ICD-10-CM

## 2020-08-13 DIAGNOSIS — Z6841 Body Mass Index (BMI) 40.0 and over, adult: Secondary | ICD-10-CM

## 2020-08-13 DIAGNOSIS — E559 Vitamin D deficiency, unspecified: Secondary | ICD-10-CM | POA: Diagnosis not present

## 2020-08-13 DIAGNOSIS — Z9189 Other specified personal risk factors, not elsewhere classified: Secondary | ICD-10-CM | POA: Diagnosis not present

## 2020-08-13 DIAGNOSIS — E8881 Metabolic syndrome: Secondary | ICD-10-CM | POA: Diagnosis not present

## 2020-08-13 DIAGNOSIS — E88819 Insulin resistance, unspecified: Secondary | ICD-10-CM

## 2020-08-14 MED ORDER — VITAMIN D (ERGOCALCIFEROL) 1.25 MG (50000 UNIT) PO CAPS: 50000.0000 [IU] | ORAL_CAPSULE | ORAL | 0 refills | Status: DC

## 2020-08-14 NOTE — Progress Notes (Signed)
Chief Complaint:   OBESITY Kim Garcia is here to discuss her progress with her obesity treatment plan along with follow-up of her obesity related diagnoses. Kim Garcia is on the Category 3 Plan and states she is following her eating plan approximately 75% of the time. Kim Garcia states she is walking 30-45 minutes 5 times per week.  Today's visit was #: 4 Starting weight: 256 lbs Starting date: 06/18/2020 Today's weight: 257 lbs Today's date: 08/13/2020 Total lbs lost to date: 0 Total lbs lost since last in-office visit: 0  Interim History: Kim Garcia was on vacation and did not eat on plan for dinner. She notes doing the cereal option for breakfast or sausage and egg sandwich, eating a sandwich for lunch, and meat and vegetables for dinner. She states her hunger is satisfied most of the time.  Subjective:   Vitamin D deficiency. Kim Garcia is on Vitamin D weekly, which she is tolerating well. She is walking outside regularly.   Ref. Range 06/18/2020 14:37  Vitamin D, 25-Hydroxy Latest Ref Range: 30.0 - 100.0 ng/mL 20.7 (L)   Insulin resistance. Kim Garcia has a diagnosis of insulin resistance based on her elevated fasting insulin level >5. She continues to work on diet and exercise to decrease her risk of diabetes. Kim Garcia is on no medication and denies polyphagia. She is exercising regularly.  Lab Results  Component Value Date   INSULIN 16.9 06/18/2020   Lab Results  Component Value Date   HGBA1C 5.1 06/18/2020   At risk for diabetes mellitus. Kim Garcia is at higher than average risk for developing diabetes due to her obesity.   Assessment/Plan:   Vitamin D deficiency. Low Vitamin D level contributes to fatigue and are associated with obesity, breast, and colon cancer. She was given a refill on her Vitamin D, Ergocalciferol, (DRISDOL) 1.25 MG (50000 UNIT) CAPS capsule every week #4 with 0 refills and will follow-up for routine testing of Vitamin D, at least 2-3 times per year to avoid over-replacement.    Insulin resistance. Kim Garcia will continue to work on weight loss, exercise, and decreasing simple carbohydrates to help decrease the risk of diabetes. Kim Garcia agreed to follow-up with [name] as directed to closely monitor her progress.  At risk for diabetes mellitus. Kim Garcia was given approximately 15 minutes of diabetes education and counseling today. We discussed intensive lifestyle modifications today with an emphasis on weight loss as well as increasing exercise and decreasing simple carbohydrates in her diet. We also reviewed medication options with an emphasis on risk versus benefit of those discussed.   Repetitive spaced learning was employed today to elicit superior memory formation and behavioral change.  Class 3 severe obesity with serious comorbidity and body mass index (BMI) of 45.0 to 49.9 in adult, unspecified obesity type (HCC).  Kim Garcia is currently in the action stage of change. As such, her goal is to continue with weight loss efforts. She has agreed to keeping a food journal and adhering to recommended goals of 1500 calories and 95 grams of protein daily.   Exercise goals: For substantial health benefits, adults should do at least 150 minutes (2 hours and 30 minutes) a week of moderate-intensity, or 75 minutes (1 hour and 15 minutes) a week of vigorous-intensity aerobic physical activity, or an equivalent combination of moderate- and vigorous-intensity aerobic activity. Aerobic activity should be performed in episodes of at least 10 minutes, and preferably, it should be spread throughout the week.  Behavioral modification strategies: no skipping meals and meal planning and  cooking strategies.  Kim Garcia has agreed to follow-up with our clinic in 3 weeks. She was informed of the importance of frequent follow-up visits to maximize her success with intensive lifestyle modifications for her multiple health conditions.   Objective:   Blood pressure (!) 142/83, pulse 82, temperature 97.6 F (36.4 C),  temperature source Oral, height 5\' 2"  (1.575 m), weight 257 lb (116.6 kg), SpO2 97 %. Body mass index is 47.01 kg/m.  General: Cooperative, alert, well developed, in no acute distress. HEENT: Conjunctivae and lids unremarkable. Cardiovascular: Regular rhythm.  Lungs: Normal work of breathing. Neurologic: No focal deficits.   Lab Results  Component Value Date   CREATININE 0.72 06/18/2020   BUN 8 06/18/2020   NA 141 06/18/2020   K 4.2 06/18/2020   CL 104 06/18/2020   CO2 25 06/18/2020   Lab Results  Component Value Date   ALT 13 06/18/2020   AST 17 06/18/2020   ALKPHOS 124 (H) 06/18/2020   BILITOT 0.3 06/18/2020   Lab Results  Component Value Date   HGBA1C 5.1 06/18/2020   HGBA1C 5.0 05/22/2018   Lab Results  Component Value Date   INSULIN 16.9 06/18/2020   Lab Results  Component Value Date   TSH 1.200 06/18/2020   Lab Results  Component Value Date   CHOL 171 06/18/2020   HDL 39 (L) 06/18/2020   LDLCALC 107 (H) 06/18/2020   TRIG 139 06/18/2020   Lab Results  Component Value Date   WBC 6.8 06/18/2020   HGB 13.4 06/18/2020   HCT 42.5 06/18/2020   MCV 85 06/18/2020   PLT 226 06/18/2020   Lab Results  Component Value Date   IRON 34 05/22/2018   TIBC 340 05/22/2018   FERRITIN 32 05/22/2018   Attestation Statements:   Reviewed by clinician on day of visit: allergies, medications, problem list, medical history, surgical history, family history, social history, and previous encounter notes.  I08/09/2018, am acting as transcriptionist for Marianna Payment, PA-C   I have reviewed the above documentation for accuracy and completeness, and I agree with the above. Alois Cliche, PA-C

## 2020-08-30 ENCOUNTER — Other Ambulatory Visit (INDEPENDENT_AMBULATORY_CARE_PROVIDER_SITE_OTHER): Payer: Self-pay | Admitting: Physician Assistant

## 2020-08-30 DIAGNOSIS — E559 Vitamin D deficiency, unspecified: Secondary | ICD-10-CM

## 2020-09-04 ENCOUNTER — Ambulatory Visit (INDEPENDENT_AMBULATORY_CARE_PROVIDER_SITE_OTHER): Payer: Managed Care, Other (non HMO) | Admitting: Physician Assistant

## 2020-09-05 ENCOUNTER — Encounter: Payer: Self-pay | Admitting: Family Medicine

## 2020-09-05 ENCOUNTER — Other Ambulatory Visit: Payer: Self-pay

## 2020-09-05 ENCOUNTER — Telehealth (INDEPENDENT_AMBULATORY_CARE_PROVIDER_SITE_OTHER): Payer: Managed Care, Other (non HMO) | Admitting: Family Medicine

## 2020-09-05 DIAGNOSIS — M722 Plantar fascial fibromatosis: Secondary | ICD-10-CM

## 2020-09-05 DIAGNOSIS — F329 Major depressive disorder, single episode, unspecified: Secondary | ICD-10-CM | POA: Diagnosis not present

## 2020-09-05 DIAGNOSIS — F419 Anxiety disorder, unspecified: Secondary | ICD-10-CM

## 2020-09-05 DIAGNOSIS — F32A Depression, unspecified: Secondary | ICD-10-CM

## 2020-09-05 MED ORDER — BUPROPION HCL ER (XL) 150 MG PO TB24: 150.0000 mg | ORAL_TABLET | Freq: Every day | ORAL | 1 refills | Status: DC

## 2020-09-05 NOTE — Progress Notes (Signed)
Virtual Visit via video Note  This visit type was conducted due to national recommendations for restrictions regarding the COVID-19 pandemic (e.g. social distancing).  This format is felt to be most appropriate for this patient at this time.  All issues noted in this document were discussed and addressed.  No physical exam was performed (except for noted visual exam findings with Video Visits).   I connected with Kim Garcia today at  1:15 PM EDT by a video enabled telemedicine application and verified that I am speaking with the correct person using two identifiers. Location patient: home Location provider: work  Persons participating in the virtual visit: patient, provider  I discussed the limitations, risks, security and privacy concerns of performing an evaluation and management service by telephone and the availability of in person appointments. I also discussed with the patient that there may be a patient responsible charge related to this service. The patient expressed understanding and agreed to proceed.  Reason for visit: f/u  HPI: Anxiety/depression: Patient notes she feels as though her depression is worse.  She has some weeks that are better than others.  She has not been able to get things she would like to do done at home and her motivation level is lower than typical.  She has had a conversation with her husband regarding his behavior and things have improved recently though she is unsure how long this will last.  She has been on Lexapro 20 mg with some benefit.  No SI.  Plantar fasciitis: Right greater than left.  Has been bothering her significantly recently.  She has been using ibuprofen and a roller as well as ice.  Notes many more flares.  She walked a lot today and could barely walk when she got home due to the discomfort in her foot.  Obesity: She has been seeing the weight management clinic.  They provided lots of good guidance on diet and advised her she was not getting  enough calories or protein.  She is walking for exercise.   ROS: See pertinent positives and negatives per HPI.  Past Medical History:  Diagnosis Date  . Anemia   . Anxiety   . Asthma    as child, rarely uses inhaler  . Back pain   . Bilateral swelling of feet   . Depression   . Depression   . History of blood transfusion 05/2011   1 unit transfused at [facility]  . Joint pain   . Migraines   . Missed ab 2014   no surgery required  . Obesity   . Seasonal allergies   . Sleep apnea   . Sleep apnea   . SVD (spontaneous vaginal delivery) 05/2011   x 1  . Vitamin D deficiency     Past Surgical History:  Procedure Laterality Date  . CESAREAN SECTION N/A 09/27/2014   Procedure: Primary CESAREAN SECTION;  Surgeon: Serita Kyle, MD;  Location: [facility];  Service: Obstetrics;  Laterality: N/A;  EDD: 10/04/14  . CESAREAN SECTION WITH BILATERAL TUBAL LIGATION N/A 02/25/2017   Procedure: REPEAT CESAREAN SECTION WITH BILATERAL TUBAL LIGATION;  Surgeon: Maxie Better, MD;  Location: [facility];  Service: Obstetrics;  Laterality: N/A;  EDD: 02/28/17  . WISDOM TOOTH EXTRACTION      Family History  Problem Relation Age of Onset  . Alcoholism Other        Parent  . Colon cancer Maternal Grandmother   . Diabetes Maternal Grandmother   . Stroke Paternal  Grandmother   . Diabetes Paternal Grandmother   . Depression Mother   . Anxiety disorder Mother   . Diabetes Father   . Alcoholism Father   . Alcohol abuse Father   . Obesity Father     SOCIAL HX: Non-smoker   Current Outpatient Medications:  .  albuterol (VENTOLIN HFA) 108 (90 Base) MCG/ACT inhaler, INHALE 1 PUFF INTO THE LUNGS EVERY 4 (FOUR) HOURS AS NEEDED FOR WHEEZING OR SHORTNESS OF BREATH., Disp: 18 g, Rfl: 1 .  cetirizine (ZYRTEC) 10 MG tablet, Take 10 mg by mouth daily., Disp: , Rfl:  .  escitalopram (LEXAPRO) 20 MG tablet, Take 1 tablet (20 mg total) by mouth daily., Disp: 90 tablet, Rfl: 1 .  minocycline  (MINOCIN) 100 MG capsule, Take 100 mg by mouth 2 (two) times daily., Disp: , Rfl:  .  SUMAtriptan (IMITREX) 50 MG tablet, Take 1 tablet (50 mg total) by mouth every 2 (two) hours as needed for migraine. May repeat in 2 hours if headache persists or recurs., Disp: 10 tablet, Rfl: 1 .  tretinoin (RETIN-A) 0.025 % cream, Apply topically at bedtime., Disp: , Rfl:  .  Vitamin D, Ergocalciferol, (DRISDOL) 1.25 MG (50000 UNIT) CAPS capsule, Take 1 capsule (50,000 Units total) by mouth every 7 (seven) days., Disp: 4 capsule, Rfl: 0 .  buPROPion (WELLBUTRIN XL) 150 MG 24 hr tablet, Take 1 tablet (150 mg total) by mouth daily., Disp: 90 tablet, Rfl: 1  EXAM:  VITALS per patient if applicable:  GENERAL: alert, oriented, appears well and in no acute distress  HEENT: atraumatic, conjunttiva clear, no obvious abnormalities on inspection of external nose and ears  NECK: normal movements of the head and neck  LUNGS: on inspection no signs of respiratory distress, breathing rate appears normal, no obvious gross SOB, gasping or wheezing  CV: no obvious cyanosis  MS: moves all visible extremities without noticeable abnormality  PSYCH/NEURO: pleasant and cooperative, no obvious depression or anxiety, speech and thought processing grossly intact  ASSESSMENT AND PLAN:  Discussed the following assessment and plan:  Plantar fasciitis Significant increase in symptoms related to this recently.  We will refer to podiatry.  Discussed continuing to ice.  Given stretches to complete.  Anxiety and depression Worsened recently.  She will continue Lexapro.  We will start her on Wellbutrin as well.  Discussed therapy though she defers this at this time as she feels that she has all the coping strategies that she would need at this time.  She notes she would go see a therapist if she feels as though she is not improving.  Morbid obesity (HCC) Continue diet and exercise.   Orders Placed This Encounter  Procedures   . Ambulatory referral to Podiatry    Referral Priority:   Routine    Referral Type:   Consultation    Referral Reason:   Specialty Services Required    Requested Specialty:   Podiatry    Number of Visits Requested:   1    Meds ordered this encounter  Medications  . buPROPion (WELLBUTRIN XL) 150 MG 24 hr tablet    Sig: Take 1 tablet (150 mg total) by mouth daily.    Dispense:  90 tablet    Refill:  1     I discussed the assessment and treatment plan with the patient. The patient was provided an opportunity to ask questions and all were answered. The patient agreed with the plan and demonstrated an understanding of the instructions.  The patient was advised to call back or seek an in-person evaluation if the symptoms worsen or if the condition fails to improve as anticipated.    Tommi Rumps, MD

## 2020-09-05 NOTE — Assessment & Plan Note (Signed)
Significant increase in symptoms related to this recently.  We will refer to podiatry.  Discussed continuing to ice.  Given stretches to complete.

## 2020-09-05 NOTE — Assessment & Plan Note (Signed)
Continue diet and exercise. 

## 2020-09-05 NOTE — Assessment & Plan Note (Signed)
Worsened recently.  She will continue Lexapro.  We will start her on Wellbutrin as well.  Discussed therapy though she defers this at this time as she feels that she has all the coping strategies that she would need at this time.  She notes she would go see a therapist if she feels as though she is not improving.

## 2020-09-10 ENCOUNTER — Encounter (INDEPENDENT_AMBULATORY_CARE_PROVIDER_SITE_OTHER): Payer: Self-pay | Admitting: Family Medicine

## 2020-09-10 ENCOUNTER — Ambulatory Visit (INDEPENDENT_AMBULATORY_CARE_PROVIDER_SITE_OTHER): Payer: Managed Care, Other (non HMO) | Admitting: Family Medicine

## 2020-09-10 ENCOUNTER — Other Ambulatory Visit: Payer: Self-pay

## 2020-09-10 VITALS — BP 121/84 | HR 76 | Temp 98.9°F | Ht 62.0 in | Wt 255.0 lb

## 2020-09-10 DIAGNOSIS — F419 Anxiety disorder, unspecified: Secondary | ICD-10-CM | POA: Diagnosis not present

## 2020-09-10 DIAGNOSIS — E559 Vitamin D deficiency, unspecified: Secondary | ICD-10-CM | POA: Diagnosis not present

## 2020-09-10 DIAGNOSIS — F329 Major depressive disorder, single episode, unspecified: Secondary | ICD-10-CM

## 2020-09-10 DIAGNOSIS — F32A Depression, unspecified: Secondary | ICD-10-CM

## 2020-09-10 DIAGNOSIS — Z6841 Body Mass Index (BMI) 40.0 and over, adult: Secondary | ICD-10-CM

## 2020-09-10 MED ORDER — VITAMIN D (ERGOCALCIFEROL) 1.25 MG (50000 UNIT) PO CAPS: 50000.0000 [IU] | ORAL_CAPSULE | ORAL | 0 refills | Status: DC

## 2020-09-10 NOTE — Progress Notes (Signed)
Chief Complaint:   OBESITY Kim Garcia is here to discuss her progress with her obesity treatment plan along with follow-up of her obesity related diagnoses. Kim Garcia is on keeping a food journal and adhering to recommended goals of 1500 calories and 95 grams of protein daily and states she is following her eating plan approximately 50% of the time. [name] states she has increased walking.  Today's visit was #: 5 Starting weight: 256 lbs Starting date: 06/18/2020 Today's weight: 255 lbs Today's date: 09/10/2020 Total lbs lost to date: 1 Total lbs lost since last in-office visit: 2  Interim History: Kim Garcia notes worsened depression over the past month due to multiple stressors. She feels quite overwhelmed juggling family responsibilities and work. She has 3 daughters aged 93, 10, and 61. She has been struggling to adhere to the plan the past 3 weeks. When she was journaling, she was meeting her calories and protein goals. However, she is not journaling regularly. She denies excessive hunger.  Subjective:   1. Vitamin D deficiency [name] last Vit D level was low at 20.7. She is on prescription Vit D.  2. Anxiety and depression Her PCP is managing her depression and anxiety. Kim Garcia notes depression has worsened over the past month due to family stressors. Bupropion was added last week by her primary care physician. She is also on Lexapro 20 mg. She notes cravings. Her PCP suggested counseling but she is not ready for this at this time. 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   Assessment/Plan:   1. Vitamin D deficiency Refill prescription Vitamin D for 1 month.  - Vitamin D, Ergocalciferol, (DRISDOL) 1.25 MG (50000 UNIT) CAPS capsule; Take 1 capsule (50,000 Units total) by mouth every 7 (seven) days.  Dispense: 4 capsule; Refill: 0  2. Anxiety and depression  Melis agreed to continue taking Lexapro and bupropion. She will follow up with PCP for med management.   3. Class 3 severe  obesity with serious comorbidity and body mass index (BMI) of 45.0 to 49.9 in adult, unspecified obesity type [facility]) Kim Garcia is currently in the action stage of change. As such, her goal is to continue with weight loss efforts. She has agreed to keeping a food journal and adhering to recommended goals of 1500-1600 calories and 95 grams of protein daily.   Exercise goals: As is.  Behavioral modification strategies: no skipping meals and keeping a strict food journal.  Kim Garcia has agreed to follow-up with our clinic in 2 to 3 weeks.  Objective:   Blood pressure 121/84, pulse 76, temperature 98.9 F (37.2 C), temperature source Oral, height 5\' 2"  (1.575 m), weight 255 lb (115.7 kg), last menstrual period 08/13/2020, SpO2 96 %. Body mass index is 46.64 kg/m.  General: Cooperative, alert, well developed, in no acute distress. Tearful at times. HEENT: Conjunctivae and lids unremarkable. Cardiovascular: Regular rhythm.  Lungs: Normal work of breathing. Neurologic: No focal deficits.   Lab Results  Component Value Date   CREATININE 0.72 06/18/2020   BUN 8 06/18/2020   NA 141 06/18/2020   K 4.2 06/18/2020   CL 104 06/18/2020   CO2 25 06/18/2020   Lab Results  Component Value Date   ALT 13 06/18/2020   AST 17 06/18/2020   ALKPHOS 124 (H) 06/18/2020   BILITOT 0.3 06/18/2020   Lab Results  Component Value Date   HGBA1C 5.1 06/18/2020   HGBA1C 5.0 05/22/2018   Lab Results  Component Value Date  INSULIN 16.9 06/18/2020   Lab Results  Component Value Date   TSH 1.200 06/18/2020   Lab Results  Component Value Date   CHOL 171 06/18/2020   HDL 39 (L) 06/18/2020   LDLCALC 107 (H) 06/18/2020   TRIG 139 06/18/2020   Lab Results  Component Value Date   WBC 6.8 06/18/2020   HGB 13.4 06/18/2020   HCT 42.5 06/18/2020   MCV 85 06/18/2020   PLT 226 06/18/2020   Lab Results  Component Value Date   IRON 34 05/22/2018   TIBC 340 05/22/2018   FERRITIN 32 05/22/2018   Attestation  Statements:   Reviewed by clinician on day of visit: allergies, medications, problem list, medical history, surgical history, family history, social history, and previous encounter notes.   Trude Mcburney, am acting as Energy manager for [place], FNP-C.  I have reviewed the above documentation for accuracy and completeness, and I agree with the above. -  Jesse Sans, FNP

## 2020-09-11 ENCOUNTER — Encounter (INDEPENDENT_AMBULATORY_CARE_PROVIDER_SITE_OTHER): Payer: Self-pay | Admitting: Family Medicine

## 2020-09-24 ENCOUNTER — Other Ambulatory Visit: Payer: Self-pay

## 2020-09-24 ENCOUNTER — Ambulatory Visit (INDEPENDENT_AMBULATORY_CARE_PROVIDER_SITE_OTHER): Payer: Managed Care, Other (non HMO) | Admitting: Family Medicine

## 2020-09-24 ENCOUNTER — Encounter (INDEPENDENT_AMBULATORY_CARE_PROVIDER_SITE_OTHER): Payer: Self-pay | Admitting: Family Medicine

## 2020-09-24 VITALS — BP 124/82 | HR 89 | Temp 97.9°F | Ht 62.0 in | Wt 255.0 lb

## 2020-09-24 DIAGNOSIS — E8881 Metabolic syndrome: Secondary | ICD-10-CM

## 2020-09-24 DIAGNOSIS — F419 Anxiety disorder, unspecified: Secondary | ICD-10-CM

## 2020-09-24 DIAGNOSIS — G4733 Obstructive sleep apnea (adult) (pediatric): Secondary | ICD-10-CM

## 2020-09-24 DIAGNOSIS — Z6841 Body Mass Index (BMI) 40.0 and over, adult: Secondary | ICD-10-CM

## 2020-09-24 DIAGNOSIS — Z9189 Other specified personal risk factors, not elsewhere classified: Secondary | ICD-10-CM | POA: Diagnosis not present

## 2020-09-24 DIAGNOSIS — E559 Vitamin D deficiency, unspecified: Secondary | ICD-10-CM

## 2020-09-24 DIAGNOSIS — F32A Depression, unspecified: Secondary | ICD-10-CM

## 2020-09-24 DIAGNOSIS — E88819 Insulin resistance, unspecified: Secondary | ICD-10-CM

## 2020-09-24 DIAGNOSIS — E66813 Obesity, class 3: Secondary | ICD-10-CM

## 2020-09-24 MED ORDER — VITAMIN D (ERGOCALCIFEROL) 1.25 MG (50000 UNIT) PO CAPS: 50000.0000 [IU] | ORAL_CAPSULE | ORAL | 0 refills | Status: DC

## 2020-09-24 MED ORDER — METFORMIN HCL 500 MG PO TABS: 500.0000 mg | ORAL_TABLET | Freq: Every day | ORAL | 0 refills | Status: DC

## 2020-09-25 ENCOUNTER — Encounter (INDEPENDENT_AMBULATORY_CARE_PROVIDER_SITE_OTHER): Payer: Self-pay | Admitting: Family Medicine

## 2020-09-25 DIAGNOSIS — E88819 Insulin resistance, unspecified: Secondary | ICD-10-CM | POA: Insufficient documentation

## 2020-09-25 DIAGNOSIS — E8881 Metabolic syndrome: Secondary | ICD-10-CM | POA: Insufficient documentation

## 2020-09-25 DIAGNOSIS — G4733 Obstructive sleep apnea (adult) (pediatric): Secondary | ICD-10-CM | POA: Insufficient documentation

## 2020-09-25 NOTE — Progress Notes (Signed)
Chief Complaint:   OBESITY Kim Garcia is here to discuss her progress with her obesity treatment plan along with follow-up of her obesity related diagnoses. Kim Garcia is keeping a food journal and adhering to recommended goals of 1500 calories and 95 grams of protein and states she is following her eating plan approximately 75% of the time. Kim Garcia states she is walking 45 minutes 3 times per week.  Today's visit was #: 6 Starting weight: 256 lbs Starting date: 06/18/2020 Today's weight: 255 lbs Today's date: 09/24/2020 Total lbs lost to date: 1 Total lbs lost since last in-office visit: 0  Interim History: Kim Garcia notes she is feeling better overall and things are better at home. She reported feeling overwhelmed, depressed, anxious at her last OV. She is journaling more often, but notes she needs to do better. She says she needs to plan better for meals.  Subjective:   Anxiety and depression. Kim Garcia feels her mood is improving. She denies any suicidal or homicidal ideations. She is on Lexapro and bupropion per her PCP. She is not currently seeing a Veterinary surgeon. She is an LCSW herself.  Insulin resistance. Kim Garcia has a diagnosis of insulin resistance based on her elevated fasting insulin level >5. Kim Garcia notes polyphagia in the afternoon.  Lab Results  Component Value Date   INSULIN 16.9 06/18/2020   Lab Results  Component Value Date   HGBA1C 5.1 06/18/2020   OSA (obstructive sleep apnea). Kim Garcia uses CPAP 5 out of 7 nights. She says she feels better when she uses it.   Vitamin D deficiency. Last Vitamin D level was 20.7 on 06/18/2020. Kim Garcia is on weekly prescription Vitamin D supplementation.    Ref. Range 06/18/2020 14:37  Vitamin D, 25-Hydroxy Latest Ref Range: 30.0 - 100.0 ng/mL 20.7 (L)   At risk for diabetes mellitus. Lachina is at higher than average risk for developing diabetes due to insulin resistance and family history of diabetes mellitus.  Assessment/Plan:   Anxiety and depression.  Kim Garcia was instructed to continue her bupropion and Lexapro per her PCP.  Insulin resistance.  Kim Garcia agreed to follow-up with [name] as directed to closely monitor her progress. New prescription was given for metFORMIN (GLUCOPHAGE) 500 MG tablet daily at lunch #30 with 0 refills.  OSA (obstructive sleep apnea). Kim Garcia plans on improving compliance with CPAP.  Vitamin D deficiency.  She was given a refill on her Vitamin D, Ergocalciferol, (DRISDOL) 1.25 MG (50000 UNIT) CAPS capsule every week #4 with 0 refills.  At risk for diabetes mellitus. Kim Garcia was given approximately 15 minutes of diabetes education and counseling today. We discussed intensive lifestyle modifications today with an emphasis on weight loss as well as increasing exercise and decreasing simple carbohydrates in her diet. We also reviewed medication options with an emphasis on risk versus benefit of those discussed.   Repetitive spaced learning was employed today to elicit superior memory formation and behavioral change.  Class 3 severe obesity with serious comorbidity and body mass index (BMI) of 45.0 to 49.9 in adult, unspecified obesity type (HCC).  Kim Garcia is currently in the action stage of change. As such, her goal is to continue with weight loss efforts. She has agreed to keeping a food journal and adhering to recommended goals of 1500-1600 calories and 90 grams of protein daily.   Exercise goals: Kim Garcia will continue her current exercise regimen.   Behavioral modification strategies: decreasing simple carbohydrates, no skipping meals and meal planning and cooking strategies.  Kim Garcia has  agreed to follow-up with our clinic in 3 weeks.   Objective:   Blood pressure 124/82, pulse 89, temperature 97.9 F (36.6 C), height 5\' 2"  (1.575 m), weight 255 lb (115.7 kg), SpO2 (!) 89 %. Body mass index is 46.64 kg/m.  General: Cooperative, alert, well developed, in no acute distress. HEENT: Conjunctivae and lids  unremarkable. Cardiovascular: Regular rhythm.  Lungs: Normal work of breathing. Neurologic: No focal deficits.   Lab Results  Component Value Date   CREATININE 0.72 06/18/2020   BUN 8 06/18/2020   NA 141 06/18/2020   K 4.2 06/18/2020   CL 104 06/18/2020   CO2 25 06/18/2020   Lab Results  Component Value Date   ALT 13 06/18/2020   AST 17 06/18/2020   ALKPHOS 124 (H) 06/18/2020   BILITOT 0.3 06/18/2020   Lab Results  Component Value Date   HGBA1C 5.1 06/18/2020   HGBA1C 5.0 05/22/2018   Lab Results  Component Value Date   INSULIN 16.9 06/18/2020   Lab Results  Component Value Date   TSH 1.200 06/18/2020   Lab Results  Component Value Date   CHOL 171 06/18/2020   HDL 39 (L) 06/18/2020   LDLCALC 107 (H) 06/18/2020   TRIG 139 06/18/2020   Lab Results  Component Value Date   WBC 6.8 06/18/2020   HGB 13.4 06/18/2020   HCT 42.5 06/18/2020   MCV 85 06/18/2020   PLT 226 06/18/2020   Lab Results  Component Value Date   IRON 34 05/22/2018   TIBC 340 05/22/2018   FERRITIN 32 05/22/2018   Attestation Statements:   Reviewed by clinician on day of visit: allergies, medications, problem list, medical history, surgical history, family history, social history, and previous encounter notes.  I08/09/2018, am acting as Marianna Payment for Energy manager, FNP-C   I have reviewed the above documentation for accuracy and completeness, and I agree with the above. -  [place], FNP

## 2020-10-14 ENCOUNTER — Encounter (INDEPENDENT_AMBULATORY_CARE_PROVIDER_SITE_OTHER): Payer: Self-pay | Admitting: Family Medicine

## 2020-10-16 ENCOUNTER — Ambulatory Visit (INDEPENDENT_AMBULATORY_CARE_PROVIDER_SITE_OTHER): Payer: Managed Care, Other (non HMO) | Admitting: Family Medicine

## 2021-01-09 ENCOUNTER — Other Ambulatory Visit: Payer: Self-pay | Admitting: Family Medicine

## 2021-04-07 ENCOUNTER — Other Ambulatory Visit: Payer: Self-pay | Admitting: Family Medicine

## 2021-04-29 ENCOUNTER — Telehealth: Payer: Managed Care, Other (non HMO) | Admitting: Family

## 2021-04-29 DIAGNOSIS — H00019 Hordeolum externum unspecified eye, unspecified eyelid: Secondary | ICD-10-CM | POA: Diagnosis not present

## 2021-04-29 MED ORDER — BACITRACIN-POLYMYXIN B 500-10000 UNIT/GM OP OINT: 1.0000 "application " | TOPICAL_OINTMENT | Freq: Two times a day (BID) | OPHTHALMIC | 0 refills | Status: DC

## 2021-04-29 NOTE — Progress Notes (Signed)
  E-Visit for Stye   We are sorry that you are not feeling well. Here is how we plan to help!  Based on what you have shared with me it looks like you have a stye.  A stye is an inflammation of the eyelid.  It is often a red, painful lump near the edge of the eyelid that may look like a boil or a pimple.  A stye develops when an infection occurs at the base of an eyelash.   We have made appropriate suggestions for you based upon your presentation: Your symptoms may indicate an infection of the eye lid. I have prescribed polysporin ointment you will apply to the eye lid twice a day.   HOME CARE:   Wash your hands often!  Let the stye open on its own. Don't squeeze or open it.  Don't rub your eyes. This can irritate your eyes and let in bacteria.  If you need to touch your eyes, wash your hands first.  Don't wear eye makeup or contact lenses until the area has healed.  GET HELP RIGHT AWAY IF:   Your symptoms do not improve.  You develop blurred or loss of vision.  Your symptoms worsen (increased discharge, pain or redness).  Thank you for choosing an e-visit.  Your e-visit answers were reviewed by a board certified advanced clinical practitioner to complete your personal care plan.  Depending upon the condition, your plan could have included both over the counter or prescription medications.  Please review your pharmacy choice.  Make sure the pharmacy is open so you can pick up prescription now.  If there is a problem, you may contact your provider through [employer] and have the prescription routed to another pharmacy.    Your safety is important to [name].  If you have drug allergies check your prescription carefully.  For the next 24 hours you can use MyChart to ask questions about today's visit, request a non-urgent call back, or ask for a work or school excuse.  You will get an email in the next two days asking about your experience.  I hope you that your e-visit has  been valuable and will speed your recovery.   Approximately 5 minutes was spent documenting and reviewing patient's chart.

## 2021-07-16 ENCOUNTER — Other Ambulatory Visit: Payer: Self-pay | Admitting: Family Medicine

## 2021-09-18 ENCOUNTER — Telehealth (INDEPENDENT_AMBULATORY_CARE_PROVIDER_SITE_OTHER): Payer: BC Managed Care – PPO | Admitting: Family Medicine

## 2021-09-18 ENCOUNTER — Other Ambulatory Visit: Payer: Self-pay

## 2021-09-18 DIAGNOSIS — W19XXXA Unspecified fall, initial encounter: Secondary | ICD-10-CM | POA: Diagnosis not present

## 2021-09-18 DIAGNOSIS — F32A Depression, unspecified: Secondary | ICD-10-CM | POA: Diagnosis not present

## 2021-09-18 DIAGNOSIS — F419 Anxiety disorder, unspecified: Secondary | ICD-10-CM | POA: Diagnosis not present

## 2021-09-18 DIAGNOSIS — R41840 Attention and concentration deficit: Secondary | ICD-10-CM | POA: Diagnosis not present

## 2021-09-18 MED ORDER — BUPROPION HCL ER (XL) 300 MG PO TB24: 300.0000 mg | ORAL_TABLET | Freq: Every day | ORAL | 2 refills | Status: DC

## 2021-09-18 NOTE — Progress Notes (Signed)
Virtual Visit via video Note  This visit type was conducted due to national recommendations for restrictions regarding the COVID-19 pandemic (e.g. social distancing).  This format is felt to be most appropriate for this patient at this time.  All issues noted in this document were discussed and addressed.  No physical exam was performed (except for noted visual exam findings with Video Visits).   I connected with Kim Garcia today at 11:30 AM EDT by a video enabled telemedicine application or telephone and verified that I am speaking with the correct person using two identifiers. Location patient: home Location provider: work  Persons participating in the virtual visit: patient, provider  I discussed the limitations, risks, security and privacy concerns of performing an evaluation and management service by telephone and the availability of in person appointments. I also discussed with the patient that there may be a patient responsible charge related to this service. The patient expressed understanding and agreed to proceed.  Reason for visit: Follow-up  HPI: Anxiety/depression/focusing issues: Patient has chronic anxiety and depression.  These things continue to be bothersome.  She notes lots of issues focusing.  She has difficulty managing her time.  It is affecting her work.  She is up staying late to finish her work.  This is also affecting home activities as well.  She feels like she is sleeping more.  She feels more overwhelmed and irritable than usual.  She does feel lethargic.  She does enjoy her work.  She has never had an ADHD evaluation.  No SI or HI.  She is currently on Lexapro 20 mg once daily and Wellbutrin 150 mg daily.  Fall: Patient notes she fell while tripping over a baby gate that she has up for her dogs.  She hit her face.  She is unsure if her phone hit her or if she hit it on the counter.  She felt dizzy for a moment afterwards though recovered well.  She notes no black and  blue bruising though does have some discomfort over the soft tissue portion of her left nostril and cheek.  She had no loss of consciousness.  She notes the area feels a little swollen.  She notes no nasal discharge or nasal bleeding.  No vision changes.  No numbness.  No weakness.   ROS: See pertinent positives and negatives per HPI.  Past Medical History:  Diagnosis Date   Anemia    Anxiety    Asthma    as child, rarely uses inhaler   Back pain    Bilateral swelling of feet    Depression    Depression    History of blood transfusion 05/2011   1 unit transfused at [facility]   Joint pain    Migraines    Missed ab 2014   no surgery required   Obesity    Seasonal allergies    Sleep apnea    Sleep apnea    SVD (spontaneous vaginal delivery) 05/2011   x 1   Vitamin D deficiency     Past Surgical History:  Procedure Laterality Date   CESAREAN SECTION N/A 09/27/2014   Procedure: Primary CESAREAN SECTION;  Surgeon: Serita Kyle, MD;  Location: [facility];  Service: Obstetrics;  Laterality: N/A;  EDD: 10/04/14   CESAREAN SECTION WITH BILATERAL TUBAL LIGATION N/A 02/25/2017   Procedure: REPEAT CESAREAN SECTION WITH BILATERAL TUBAL LIGATION;  Surgeon: Maxie Better, MD;  Location: [facility];  Service: Obstetrics;  Laterality: N/A;  EDD: 02/28/17  WISDOM TOOTH EXTRACTION      Family History  Problem Relation Age of Onset   Alcoholism Other        Parent   Colon cancer Maternal Grandmother    Diabetes Maternal Grandmother    Stroke Paternal Grandmother    Diabetes Paternal Grandmother    Depression Mother    Anxiety disorder Mother    Diabetes Father    Alcoholism Father    Alcohol abuse Father    Obesity Father     SOCIAL HX: Non-smoker   Current Outpatient Medications:    albuterol (VENTOLIN HFA) 108 (90 Base) MCG/ACT inhaler, INHALE 1 PUFF INTO THE LUNGS EVERY 4 (FOUR) HOURS AS NEEDED FOR WHEEZING OR SHORTNESS OF BREATH., Disp: 18 g, Rfl: 1   buPROPion  (WELLBUTRIN XL) 300 MG 24 hr tablet, Take 1 tablet (300 mg total) by mouth daily., Disp: 90 tablet, Rfl: 2   cetirizine (ZYRTEC) 10 MG tablet, Take 10 mg by mouth daily., Disp: , Rfl:    escitalopram (LEXAPRO) 20 MG tablet, TAKE 1 TABLET BY MOUTH EVERY DAY, Disp: 90 tablet, Rfl: 0   SUMAtriptan (IMITREX) 50 MG tablet, TAKE 1 TABLET (50 MG TOTAL) BY MOUTH EVERY 2 (TWO) HOURS AS NEEDED FOR MIGRAINE. MAY REPEAT IN 2 HOURS IF HEADACHE PERSISTS OR RECURS., Disp: 9 tablet, Rfl: 2   tretinoin (RETIN-A) 0.025 % cream, Apply topically at bedtime., Disp: , Rfl:   EXAM:  VITALS per patient if applicable:  GENERAL: alert, oriented, appears well and in no acute distress  HEENT: atraumatic, conjunttiva clear, no obvious abnormalities on inspection of external nose and ears  NECK: normal movements of the head and neck  LUNGS: on inspection no signs of respiratory distress, breathing rate appears normal, no obvious gross SOB, gasping or wheezing  CV: no obvious cyanosis  MS: moves all visible extremities without noticeable abnormality  PSYCH/NEURO: pleasant and cooperative, no obvious depression or anxiety, speech and thought processing grossly intact  ASSESSMENT AND PLAN:  Discussed the following assessment and plan:  Problem List Items Addressed This Visit     Anxiety and depression    Seem to have worsened.  Discussed that this certainly could be playing a role in her focusing issues or she could have some level of ADHD.  We will increase her Wellbutrin to 300 mg once daily.  She will continue Lexapro 20 mg once daily.  She will monitor on this and follow-up in 8 weeks.      Relevant Medications   buPROPion (WELLBUTRIN XL) 300 MG 24 hr tablet   Attention deficit    Possibly related to her anxiety and depression though it is certainly possible she could have ADHD underlying all of this as well.  We will refer her for an ADHD evaluation.  If positive I advised I could prescribe medication for  this.      Relevant Orders   Ambulatory referral to Psychology   Fall    The patient had a mechanical fall.  Sounds as though she may have hit her face on her phone and less likely the countertop based on her description of her injury.  Discussed that the soft tissue injury may take several weeks to fully resolve.  If she has any worsening symptoms or develops any discharge from her nose or any new symptoms she will be evaluated in person.       Return in about 8 weeks (around 11/13/2021).   I discussed the assessment and treatment plan with  the patient. The patient was provided an opportunity to ask questions and all were answered. The patient agreed with the plan and demonstrated an understanding of the instructions.   The patient was advised to call back or seek an in-person evaluation if the symptoms worsen or if the condition fails to improve as anticipated.    [name], MD

## 2021-09-18 NOTE — Assessment & Plan Note (Signed)
Seem to have worsened.  Discussed that this certainly could be playing a role in her focusing issues or she could have some level of ADHD.  We will increase her Wellbutrin to 300 mg once daily.  She will continue Lexapro 20 mg once daily.  She will monitor on this and follow-up in 8 weeks.

## 2021-09-18 NOTE — Assessment & Plan Note (Signed)
Possibly related to her anxiety and depression though it is certainly possible she could have ADHD underlying all of this as well.  We will refer her for an ADHD evaluation.  If positive I advised I could prescribe medication for this.

## 2021-09-18 NOTE — Assessment & Plan Note (Addendum)
The patient had a mechanical fall.  Sounds as though she may have hit her face on her phone and less likely the countertop based on her description of her injury.  Discussed that the soft tissue injury may take several weeks to fully resolve.  If she has any worsening symptoms or develops any discharge from her nose or any new symptoms she will be evaluated in person.

## 2021-11-19 ENCOUNTER — Ambulatory Visit (INDEPENDENT_AMBULATORY_CARE_PROVIDER_SITE_OTHER): Payer: BC Managed Care – PPO | Admitting: Psychology

## 2021-11-19 DIAGNOSIS — F89 Unspecified disorder of psychological development: Secondary | ICD-10-CM
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
## 2021-12-01 ENCOUNTER — Telehealth: Payer: BC Managed Care – PPO | Admitting: Physician Assistant

## 2021-12-01 DIAGNOSIS — H66002 Acute suppurative otitis media without spontaneous rupture of ear drum, left ear: Secondary | ICD-10-CM

## 2021-12-01 MED ORDER — AMOXICILLIN 500 MG PO CAPS: 500.0000 mg | ORAL_CAPSULE | Freq: Two times a day (BID) | ORAL | 0 refills | Status: AC

## 2021-12-01 MED ORDER — NEOMYCIN-POLYMYXIN-HC 3.5-10000-1 OT SOLN: 3.0000 [drp] | Freq: Four times a day (QID) | OTIC | 0 refills | Status: DC

## 2021-12-01 NOTE — Patient Instructions (Signed)
Glennon Mac, thank you for joining Margaretann Loveless, PA-C for today's virtual visit.  While this provider is not your primary care provider (PCP), if your PCP is located in our provider database this encounter information will be shared with them immediately following your visit.  Consent: (Patient) Kim Garcia provided verbal consent for this virtual visit at the beginning of the encounter.  Current Medications:  Current Outpatient Medications:    amoxicillin (AMOXIL) 500 MG capsule, Take 1 capsule (500 mg total) by mouth 2 (two) times daily for 10 days., Disp: 20 capsule, Rfl: 0   neomycin-polymyxin-hydrocortisone (CORTISPORIN) OTIC solution, Place 3 drops into the left ear 4 (four) times daily., Disp: 10 mL, Rfl: 0   albuterol (VENTOLIN HFA) 108 (90 Base) MCG/ACT inhaler, INHALE 1 PUFF INTO THE LUNGS EVERY 4 (FOUR) HOURS AS NEEDED FOR WHEEZING OR SHORTNESS OF BREATH., Disp: 18 g, Rfl: 1   buPROPion (WELLBUTRIN XL) 300 MG 24 hr tablet, Take 1 tablet (300 mg total) by mouth daily., Disp: 90 tablet, Rfl: 2   cetirizine (ZYRTEC) 10 MG tablet, Take 10 mg by mouth daily., Disp: , Rfl:    escitalopram (LEXAPRO) 20 MG tablet, TAKE 1 TABLET BY MOUTH EVERY DAY, Disp: 90 tablet, Rfl: 0   SUMAtriptan (IMITREX) 50 MG tablet, TAKE 1 TABLET (50 MG TOTAL) BY MOUTH EVERY 2 (TWO) HOURS AS NEEDED FOR MIGRAINE. MAY REPEAT IN 2 HOURS IF HEADACHE PERSISTS OR RECURS., Disp: 9 tablet, Rfl: 2   tretinoin (RETIN-A) 0.025 % cream, Apply topically at bedtime., Disp: , Rfl:    Medications ordered in this encounter:  Meds ordered this encounter  Medications   amoxicillin (AMOXIL) 500 MG capsule    Sig: Take 1 capsule (500 mg total) by mouth 2 (two) times daily for 10 days.    Dispense:  20 capsule    Refill:  0    Order Specific Question:   Supervising Provider    Answer:   MILLER, BRIAN [[id number]]   neomycin-polymyxin-hydrocortisone (CORTISPORIN) OTIC solution    Sig: Place 3 drops into the left ear 4 (four) times  daily.    Dispense:  10 mL    Refill:  0    Order Specific Question:   Supervising Provider    Answer:   Hyacinth Meeker, BRIAN [[id number]]     *If you need refills on other medications prior to your next appointment, please contact your pharmacy*  Follow-Up: Call back or seek an in-person evaluation if the symptoms worsen or if the condition fails to improve as anticipated.  Other Instructions Otitis Media, Adult Otitis media occurs when there is inflammation and fluid in the middle ear with signs and symptoms of an acute infection. The middle ear is a part of the ear that contains bones for hearing as well as air that helps send sounds to the brain. When infected fluid builds up in this space, it causes pressure and can lead to an ear infection. The eustachian tube connects the middle ear to the back of the nose (nasopharynx) and normally allows air into the middle ear. If the eustachian tube becomes blocked, fluid can build up and become infected. What are the causes? This condition is caused by a blockage in the eustachian tube. This can be caused by mucus or by swelling of the tube. Problems that can cause a blockage include: A cold or other upper respiratory infection. Allergies. An irritant, such as tobacco smoke. Enlarged adenoids. The adenoids are areas of soft tissue located  high in the back of the throat, behind the nose and the roof of the mouth. They are part of the body's defense system (immune system). A mass in the nasopharynx. Damage to the ear caused by pressure changes (barotrauma). What increases the risk? You are more likely to develop this condition if you: Smoke or are exposed to tobacco smoke. Have an opening in the roof of your mouth (cleft palate). Have gastroesophageal reflux. Have an immune system disorder. What are the signs or symptoms? Symptoms of this condition include: Ear pain. Fever. Decreased hearing. Tiredness (lethargy). Fluid leaking from the ear, if the  eardrum is ruptured or has burst. Ringing in the ear. How is this diagnosed? This condition is diagnosed with a physical exam. During the exam, your health care provider will use an instrument called an otoscope to look in your ear and check for redness, swelling, and fluid. He or she will also ask about your symptoms. Your health care provider may also order tests, such as: A pneumatic otoscopy. This is a test to check the movement of the eardrum. It is done by squeezing a small amount of air into the ear. A tympanogram. This is a test that shows how well the eardrum moves in response to air pressure in the ear canal. It provides a graph for your health care provider to review. How is this treated? This condition can go away on its own within 3-5 days. But if the condition is caused by a bacterial infection and does not go away on its own, or if it keeps coming back, your health care provider may: Prescribe antibiotic medicine to treat the infection. Prescribe or recommend medicines to control pain. Follow these instructions at home: Take over-the-counter and prescription medicines only as told by your health care provider. If you were prescribed an antibiotic medicine, take it as told by your health care provider. Do not stop taking the antibiotic even if you start to feel better. Keep all follow-up visits. This is important. Contact a health care provider if: You have bleeding from your nose. There is a lump on your neck. You are not feeling better in 5 days. You feel worse instead of better. Get help right away if: You have severe pain that is not controlled with medicine. You have swelling, redness, or pain around your ear. You have stiffness in your neck. A part of your face is not moving (paralyzed). The bone behind your ear (mastoid bone) is tender when you touch it. You develop a severe headache. Summary Otitis media is redness, soreness, and swelling of the middle ear, usually  resulting in pain and decreased hearing. This condition can go away on its own within 3-5 days. If the problem does not go away in 3-5 days, your health care provider may give you medicines to treat the infection. If you were prescribed an antibiotic medicine, take it as told by your health care provider. Follow all instructions that were given to you by your health care provider. This information is not intended to replace advice given to you by your health care provider. Make sure you discuss any questions you have with your health care provider. Document Revised: 03/09/2021 Document Reviewed: 03/09/2021 Elsevier Patient Education  2022 [employer].    If you have been instructed to have an in-person evaluation today at a local Urgent Care facility, please use the link below. It will take you to a list of all of our available [facility]gent [facility],  including address, phone number and hours of operation. Please do not delay care.  [facility]  If you or a family member do not have a primary care provider, use the link below to schedule a visit and establish care. When you choose a [facility] primary care physician or advanced practice provider, you gain a long-term partner in health. Find a Primary Care Provider  Learn more about [facility]'s in-office and virtual care options: [facility]

## 2021-12-01 NOTE — Progress Notes (Signed)
Virtual Visit Consent   [name], you are scheduled for a virtual visit with a [facility] provider today.     Just as with appointments in the office, your consent must be obtained to participate.  Your consent will be active for this visit and any virtual visit you may have with one of our providers in the next 365 days.     If you have a MyChart account, a copy of this consent can be sent to you electronically.  All virtual visits are billed to your insurance company just like a traditional visit in the office.    As this is a virtual visit, video technology does not allow for your provider to perform a traditional examination.  This may limit your provider's ability to fully assess your condition.  If your provider identifies any concerns that need to be evaluated in person or the need to arrange testing (such as labs, EKG, etc.), we will make arrangements to do so.     Although advances in technology are sophisticated, we cannot ensure that it will always work on either your end or our end.  If the connection with a video visit is poor, the visit may have to be switched to a telephone visit.  With either a video or telephone visit, we are not always able to ensure that we have a secure connection.     I need to obtain your verbal consent now.   Are you willing to proceed with your visit today?    [name] has provided verbal consent on 12/01/2021 for a virtual visit (video or telephone).   Margaretann Loveless, PA-C   Date: 12/01/2021 12:37 PM   Virtual Visit via Video Note   IMargaretann Loveless, connected with  KADEN DUNKEL  ([id number], [birth year]) on 12/01/21 at 12:30 PM EST by a video-enabled telemedicine application and verified that I am speaking with the correct person using two identifiers.  Location: Patient: Virtual Visit Location Patient: Home Provider: Virtual Visit Location Provider: Home Office   I discussed the limitations of evaluation and management by  telemedicine and the availability of in person appointments. The patient expressed understanding and agreed to proceed.    History of Present Illness: Kim Garcia is a 35 y.o. who identifies as a female who was assigned female at birth, and is being seen today for ear pain.  HPI: Otalgia  There is pain in the left ear. This is a new problem. Episode onset: started noticing a week ago. The problem has been unchanged. There has been no fever. The pain is moderate. Associated symptoms include headaches and hearing loss (muffled). Pertinent negatives include no ear discharge, rhinorrhea or sore throat. She has tried acetaminophen (decongestants and warm compresses) for the symptoms. The treatment provided no relief. Her past medical history is significant for a chronic ear infection.     Problems:  Patient Active Problem List   Diagnosis Date Noted   Attention deficit 09/18/2021   Fall 09/18/2021   Insulin resistance 09/25/2020   OSA (obstructive sleep apnea) 09/25/2020   Iron deficiency 06/16/2018   Vitamin D deficiency 06/16/2018   Bilateral leg edema 05/19/2018   Frequent urination 03/09/2018   Irregular menstrual cycle 03/09/2018   Encounter for general adult medical examination with abnormal findings 02/02/2018   Hypersomnia 02/02/2018   Class 3 severe obesity with serious comorbidity and body mass index (BMI) of 45.0 to 49.9 in adult (HCC) 02/02/2018   Elevated  blood pressure reading 02/02/2018   Migraine 08/19/2017   Anxiety and depression 04/01/2016   Plantar fasciitis 04/01/2016    Allergies:  Allergies  Allergen Reactions   Tape Dermatitis    Caused skin irritation, and tore skin when removing   Medications:  Current Outpatient Medications:    amoxicillin (AMOXIL) 500 MG capsule, Take 1 capsule (500 mg total) by mouth 2 (two) times daily for 10 days., Disp: 20 capsule, Rfl: 0   neomycin-polymyxin-hydrocortisone (CORTISPORIN) OTIC solution, Place 3 drops into the left ear 4  (four) times daily., Disp: 10 mL, Rfl: 0   albuterol (VENTOLIN HFA) 108 (90 Base) MCG/ACT inhaler, INHALE 1 PUFF INTO THE LUNGS EVERY 4 (FOUR) HOURS AS NEEDED FOR WHEEZING OR SHORTNESS OF BREATH., Disp: 18 g, Rfl: 1   buPROPion (WELLBUTRIN XL) 300 MG 24 hr tablet, Take 1 tablet (300 mg total) by mouth daily., Disp: 90 tablet, Rfl: 2   cetirizine (ZYRTEC) 10 MG tablet, Take 10 mg by mouth daily., Disp: , Rfl:    escitalopram (LEXAPRO) 20 MG tablet, TAKE 1 TABLET BY MOUTH EVERY DAY, Disp: 90 tablet, Rfl: 0   SUMAtriptan (IMITREX) 50 MG tablet, TAKE 1 TABLET (50 MG TOTAL) BY MOUTH EVERY 2 (TWO) HOURS AS NEEDED FOR MIGRAINE. MAY REPEAT IN 2 HOURS IF HEADACHE PERSISTS OR RECURS., Disp: 9 tablet, Rfl: 2   tretinoin (RETIN-A) 0.025 % cream, Apply topically at bedtime., Disp: , Rfl:   Observations/Objective: Patient is well-developed, well-nourished in no acute distress.  Resting comfortably at home.  Head is normocephalic, atraumatic.  No labored breathing.  Speech is clear and coherent with logical content.  Patient is alert and oriented at baseline.    Assessment and Plan: 1. Non-recurrent acute suppurative otitis media of left ear without spontaneous rupture of tympanic membrane - amoxicillin (AMOXIL) 500 MG capsule; Take 1 capsule (500 mg total) by mouth 2 (two) times daily for 10 days.  Dispense: 20 capsule; Refill: 0 - neomycin-polymyxin-hydrocortisone (CORTISPORIN) OTIC solution; Place 3 drops into the left ear 4 (four) times daily.  Dispense: 10 mL; Refill: 0  - Worsening symptoms that have not responded to OTC medications.  - Will give amoxicillin and cortisporin drops  - Continue allergy medications.  - Stay well hydrated and get plenty of rest.  - Call or seek in person evaluation if no symptom improvement or if symptoms worsen.  Follow Up Instructions: I discussed the assessment and treatment plan with the patient. The patient was provided an opportunity to ask questions and all  were answered. The patient agreed with the plan and demonstrated an understanding of the instructions.  A copy of instructions were sent to the patient via MyChart unless otherwise noted below.    The patient was advised to call back or seek an in-person evaluation if the symptoms worsen or if the condition fails to improve as anticipated.  Time:  I spent 10 minutes with the patient via telehealth technology discussing the above problems/concerns.    Margaretann Loveless, PA-C

## 2021-12-11 ENCOUNTER — Ambulatory Visit (INDEPENDENT_AMBULATORY_CARE_PROVIDER_SITE_OTHER): Payer: BC Managed Care – PPO | Admitting: Psychology

## 2021-12-11 DIAGNOSIS — F89 Unspecified disorder of psychological development: Secondary | ICD-10-CM

## 2021-12-11 NOTE — Progress Notes (Signed)
Date: 12/11/2021    Appointment Start Time: 3:05pm (patient experienced technical issues) Duration: 60 minutes Provider: [name], PsyD Type of Session: Testing Appointment for Evaluation  Location of Patient: Home Location of Provider: Provider's Home (private office) Type of Contact: WebEx video visit with audio  Session Content: Today's appointment was a telepsychological visit due to COVID-19. [name] is aware it is her responsibility to secure confidentiality on her end of the session. Prior to proceeding with today's appointment, [name]'s physical location at the time of this appointment was obtained as well a phone number she could be reached at in the event of technical difficulties. [name] denied anyone else being present in the room or on the virtual appointment. This provider reviewed that video should not be captured, photos should not be taken, nor should testing stimuli be copied or recorded as it would be a copyright violation. [name] expressed understanding of the aforementioned, and verbally consented to proceed. The WAIS-IV was administered, scored, and interpreted by this evaluator  Billing codes will be input on the feedback appointment. There are no billing codes for the testing appointment.   Mental Status Examination:  Appearance:  neat Behavior: appropriate to circumstances Mood: anxious Affect: mood congruent Speech: WNL Eye Contact: appropriate Psychomotor Activity: WNL Thought Process: linear, logical, and goal directed and denies suicidal, homicidal, and self-harm ideation, plan and intent Content/Perceptual Disturbances: none Orientation: AAOx4 Cognition/Sensorium: intact Insight: good Judgment: good  Provisional DSM-5 diagnosis(es):  F89 Unspecified Disorder of Psychological Development   Plan: [name] was scheduled for a feedback appointment on 12/18/2020 at 7am via WebEx video visit with audio.

## 2021-12-18 ENCOUNTER — Ambulatory Visit (INDEPENDENT_AMBULATORY_CARE_PROVIDER_SITE_OTHER): Payer: BC Managed Care – PPO | Admitting: Psychology

## 2021-12-18 ENCOUNTER — Encounter: Payer: Self-pay | Admitting: Family Medicine

## 2021-12-18 DIAGNOSIS — F902 Attention-deficit hyperactivity disorder, combined type: Secondary | ICD-10-CM | POA: Diagnosis not present

## 2021-12-18 NOTE — Progress Notes (Signed)
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 in case it is lost or misplaced. Utilize visual reminders by posting appointments, to do lists, or schedule in strategic areas at home and at work.  Practice using an appointment book, smart phone or other tech  device, or a daily planning calendar, and learn to write down appointments and commitments immediately. Keep notepads or use a portable audio recorder to capture important ideas that would be beneficial to recall later. Learn and practice time management skills. Purchase a programmable alarm watch or set an alarm on smartphone to avoid losing track of time.  Use a color-coded file system, desk and closet organizers, storage boxes, or other organization device to reduce clutter and improve efficiency and structure.  Implement ways to become more aware of your actions and to inhibit or adjust them as warranted (e.g., reviewing videos of your actions, consider consequences of obeying or not obeying the rules of various upcoming situations, have a trusted other to discuss plans with and/or provide cues to stop certain behaviors, and make visual cues for rules you would like to follow). Stay flexible and be prepared to change your plans as symptom breakthroughs and crises are likely to occur periodically. Ms. Ausley may benefit from neurofeedback and mindfulness training to address her  symptoms of inattention.  Ms. Balla would likely benefit from a consultation regarding medication for ADHD symptoms.   Individual therapeutic services may assist in improving coping skills for ADHD-, depression-, and anxiety-related symptoms. Mental alertness/energy can be raised by increasing exercise; improving sleep; eating a healthy diet; and managing depression, anxiety, trauma, and stress. Consult with a physician regarding any changes to physical regimen is recommended. Organizations that are a good source of information on ADHD:  Children and Adults with Attention-Deficit/Hyperactivity Disorder (CHADD): chadd.org  Attention Deficit Disorder Association (ADDA) HotterNames.de ADD Resources: addresources.org ADD WareHouse: addwarehouse.com World Federation of ADHD: adhd-federation.org ADDConsults: FightListings.se. Future evaluation if deemed necessary and/or to determine effectiveness of recommended interventions.

## 2021-12-22 NOTE — Telephone Encounter (Signed)
Pt called in wanting a prescription for ADHD. Pt said she was diagnosed this past week. I let pt know that the provider is out of the office until February.

## 2022-01-11 ENCOUNTER — Telehealth (INDEPENDENT_AMBULATORY_CARE_PROVIDER_SITE_OTHER): Payer: BC Managed Care – PPO | Admitting: Family Medicine

## 2022-01-11 ENCOUNTER — Encounter: Payer: Self-pay | Admitting: Family Medicine

## 2022-01-11 ENCOUNTER — Other Ambulatory Visit: Payer: Self-pay

## 2022-01-11 DIAGNOSIS — F909 Attention-deficit hyperactivity disorder, unspecified type: Secondary | ICD-10-CM | POA: Insufficient documentation

## 2022-01-11 MED ORDER — LISDEXAMFETAMINE DIMESYLATE 30 MG PO CAPS: 30.0000 mg | ORAL_CAPSULE | Freq: Every day | ORAL | 0 refills | Status: DC

## 2022-01-11 NOTE — Assessment & Plan Note (Signed)
New diagnosis.  Discussed treatment options of stimulants versus Strattera.  Discussed that Strattera would not be an option given that she is on Wellbutrin.  We will trial Vyvanse 30 mg once daily.  Discussed the risk of palpitations, sleeping issues, and appetite suppression.  If any of those occur she will let us know.  Discussed the risk of elevated blood pressure.  We will follow-up in about a month for this issue.  She knows to contact us to schedule an appointment if she is not contacted by our office in the next couple of days.  She was advised to contact us if the Vyvanse is excessively expensive.

## 2022-01-11 NOTE — Progress Notes (Signed)
Virtual Visit via video Note  This visit type was conducted due to national recommendations for restrictions regarding the COVID-19 pandemic (e.g. social distancing).  This format is felt to be most appropriate for this patient at this time.  All issues noted in this document were discussed and addressed.  No physical exam was performed (except for noted visual exam findings with Video Visits).   I connected with Kim Garcia today at  3:15 PM EST by a video enabled telemedicine application or telephone and verified that I am speaking with the correct person using two identifiers. Location patient: home Location provider: work Persons participating in the virtual visit: patient, provider  I discussed the limitations, risks, security and privacy concerns of performing an evaluation and management service by telephone and the availability of in person appointments. I also discussed with the patient that there may be a patient responsible charge related to this service. The patient expressed understanding and agreed to proceed.  Reason for visit: f/u  HPI: ADHD: The patient recently underwent evaluation with psychology for ADHD.  She was found to have ADHD.  She has implemented lifestyle and coping mechanisms with some benefit.  She is interested in medication.  She does report a family history of addiction and wonders if there are nonaddictive treatments.   ROS: See pertinent positives and negatives per HPI.  Past Medical History:  Diagnosis Date   Anemia    Anxiety    Asthma    as child, rarely uses inhaler   Back pain    Bilateral swelling of feet    Depression    Depression    History of blood transfusion 05/2011   1 unit transfused at [facility]   Joint pain    Migraines    Missed ab 2014   no surgery required   Obesity    Seasonal allergies    Sleep apnea    Sleep apnea    SVD (spontaneous vaginal delivery) 05/2011   x 1   Vitamin D deficiency     Past Surgical History:   Procedure Laterality Date   CESAREAN SECTION N/A 09/27/2014   Procedure: Primary CESAREAN SECTION;  Surgeon: Serita Kyle, MD;  Location: [facility];  Service: Obstetrics;  Laterality: N/A;  EDD: 10/04/14   CESAREAN SECTION WITH BILATERAL TUBAL LIGATION N/A 02/25/2017   Procedure: REPEAT CESAREAN SECTION WITH BILATERAL TUBAL LIGATION;  Surgeon: Maxie Better, MD;  Location: [facility];  Service: Obstetrics;  Laterality: N/A;  EDD: 02/28/17   WISDOM TOOTH EXTRACTION      Family History  Problem Relation Age of Onset   Alcoholism Other        Parent   Colon cancer Maternal Grandmother    Diabetes Maternal Grandmother    Stroke Paternal Grandmother    Diabetes Paternal Grandmother    Depression Mother    Anxiety disorder Mother    Diabetes Father    Alcoholism Father    Alcohol abuse Father    Obesity Father     SOCIAL HX: Non-smoker   Current Outpatient Medications:    lisdexamfetamine (VYVANSE) 30 MG capsule, Take 1 capsule (30 mg total) by mouth daily., Disp: 30 capsule, Rfl: 0   albuterol (VENTOLIN HFA) 108 (90 Base) MCG/ACT inhaler, INHALE 1 PUFF INTO THE LUNGS EVERY 4 (FOUR) HOURS AS NEEDED FOR WHEEZING OR SHORTNESS OF BREATH., Disp: 18 g, Rfl: 1   buPROPion (WELLBUTRIN XL) 300 MG 24 hr tablet, Take 1 tablet (300 mg total) by mouth  daily., Disp: 90 tablet, Rfl: 2   cetirizine (ZYRTEC) 10 MG tablet, Take 10 mg by mouth daily., Disp: , Rfl:    escitalopram (LEXAPRO) 20 MG tablet, TAKE 1 TABLET BY MOUTH EVERY DAY, Disp: 90 tablet, Rfl: 0   neomycin-polymyxin-hydrocortisone (CORTISPORIN) OTIC solution, Place 3 drops into the left ear 4 (four) times daily., Disp: 10 mL, Rfl: 0   SUMAtriptan (IMITREX) 50 MG tablet, TAKE 1 TABLET (50 MG TOTAL) BY MOUTH EVERY 2 (TWO) HOURS AS NEEDED FOR MIGRAINE. MAY REPEAT IN 2 HOURS IF HEADACHE PERSISTS OR RECURS., Disp: 9 tablet, Rfl: 2   tretinoin (RETIN-A) 0.025 % cream, Apply topically at bedtime., Disp: , Rfl:   EXAM:  VITALS  per patient if applicable:  GENERAL: alert, oriented, appears well and in no acute distress  HEENT: atraumatic, conjunttiva clear, no obvious abnormalities on inspection of external nose and ears  NECK: normal movements of the head and neck  LUNGS: on inspection no signs of respiratory distress, breathing rate appears normal, no obvious gross SOB, gasping or wheezing  CV: no obvious cyanosis  MS: moves all visible extremities without noticeable abnormality  PSYCH/NEURO: pleasant and cooperative, no obvious depression or anxiety, speech and thought processing grossly intact  ASSESSMENT AND PLAN:  Discussed the following assessment and plan:  Problem List Items Addressed This Visit     ADHD    New diagnosis.  Discussed treatment options of stimulants versus Strattera.  Discussed that Strattera would not be an option given that she is on Wellbutrin.  We will trial Vyvanse 30 mg once daily.  Discussed the risk of palpitations, sleeping issues, and appetite suppression.  If any of those occur she will let us know.  Discussed the risk of elevated blood pressure.  We will follow-up in about a month for this issue.  She knows to contact us to schedule an appointment if she is not contacted by our office in the next couple of days.  She was advised to contact us if the Vyvanse is excessively expensive.      Relevant Medications   lisdexamfetamine (VYVANSE) 30 MG capsule    Return in about 4 weeks (around 02/08/2022) for ADHD .   I discussed the assessment and treatment plan with the patient. The patient was provided an opportunity to ask questions and all were answered. The patient agreed with the plan and demonstrated an understanding of the instructions.   The patient was advised to call back or seek an in-person evaluation if the symptoms worsen or if the condition fails to improve as anticipated.   [name], MD

## 2022-01-16 ENCOUNTER — Encounter: Payer: Self-pay | Admitting: Family Medicine

## 2022-01-19 MED ORDER — AMPHETAMINE-DEXTROAMPHET ER 10 MG PO CP24: 10.0000 mg | ORAL_CAPSULE | Freq: Every day | ORAL | 0 refills | Status: DC

## 2022-02-08 ENCOUNTER — Other Ambulatory Visit: Payer: Self-pay | Admitting: Family Medicine

## 2022-02-12 ENCOUNTER — Encounter: Payer: Self-pay | Admitting: Family Medicine

## 2022-02-12 ENCOUNTER — Other Ambulatory Visit: Payer: Self-pay

## 2022-02-12 ENCOUNTER — Ambulatory Visit (INDEPENDENT_AMBULATORY_CARE_PROVIDER_SITE_OTHER): Payer: 59 | Admitting: Family Medicine

## 2022-02-12 DIAGNOSIS — F419 Anxiety disorder, unspecified: Secondary | ICD-10-CM | POA: Diagnosis not present

## 2022-02-12 DIAGNOSIS — F909 Attention-deficit hyperactivity disorder, unspecified type: Secondary | ICD-10-CM

## 2022-02-12 DIAGNOSIS — F32A Depression, unspecified: Secondary | ICD-10-CM

## 2022-02-12 MED ORDER — AMPHETAMINE-DEXTROAMPHET ER 15 MG PO CP24: 15.0000 mg | ORAL_CAPSULE | Freq: Every day | ORAL | 0 refills | Status: DC

## 2022-02-12 NOTE — Assessment & Plan Note (Signed)
Generally well controlled and stable.  She will continue Wellbutrin 300 mg once daily and Lexapro 20 mg daily. ?

## 2022-02-12 NOTE — Assessment & Plan Note (Signed)
Some benefit from the Adderall though discussed that we can increase the Adderall to 15 mg once daily to see if she gets additional benefit.  She will monitor for any side effects and let us know if those occur.  I will see her back in 4 weeks. ?

## 2022-02-12 NOTE — Patient Instructions (Signed)
Nice to see you. ?We are going to increase your Adderall to 15 mg once daily.  If you notice any appetite suppression, sleep changes, or palpitations please let us know.  If you notice any other side effects please let me know as well. ?

## 2022-02-12 NOTE — Progress Notes (Signed)
?[name], MD ?Phone: [phone] ? ?Kim Garcia is a 36 y.o. female who presents today for follow-up. ? ?ADHD ?Medication: adderall XR 10 mg daily ?Effectiveness: somewhat beneficial ?Palpitations: no ?Sleep difficulty: no ?Appetite suppression: no ?She had some drowsiness with Vyvanse and thus we switched to Adderall. ? ?Anxiety/depression: Patient notes this seems to be well controlled.  Anxiety seems to be somewhat better given that the ADHD is under better control.  She has cut back on caffeine as well.  She continues on Lexapro and Wellbutrin.  No SI. ? ? ?Social History  ? ?Tobacco Use  ?Smoking Status Never  ?Smokeless Tobacco Never  ? ? ?Current Outpatient Medications on File Prior to Visit  ?Medication Sig Dispense Refill  ? albuterol (VENTOLIN HFA) 108 (90 Base) MCG/ACT inhaler INHALE 1 PUFF INTO THE LUNGS EVERY 4 (FOUR) HOURS AS NEEDED FOR WHEEZING OR SHORTNESS OF BREATH. 18 g 1  ? buPROPion (WELLBUTRIN XL) 300 MG 24 hr tablet Take 1 tablet (300 mg total) by mouth daily. 90 tablet 2  ? cetirizine (ZYRTEC) 10 MG tablet Take 10 mg by mouth daily.    ? escitalopram (LEXAPRO) 20 MG tablet TAKE 1 TABLET BY MOUTH EVERY DAY 90 tablet 0  ? neomycin-polymyxin-hydrocortisone (CORTISPORIN) OTIC solution Place 3 drops into the left ear 4 (four) times daily. 10 mL 0  ? SUMAtriptan (IMITREX) 50 MG tablet TAKE 1 TABLET (50 MG TOTAL) BY MOUTH EVERY 2 (TWO) HOURS AS NEEDED FOR MIGRAINE. MAY REPEAT IN 2 HOURS IF HEADACHE PERSISTS OR RECURS. 9 tablet 2  ? tretinoin (RETIN-A) 0.025 % cream Apply topically at bedtime.    ? ?No current facility-administered medications on file prior to visit.  ? ? ? ?ROS see history of present illness ? ?Objective ? ?Physical Exam ?Vitals:  ? 02/12/22 1530  ?BP: 120/80  ?Pulse: 81  ?Temp: 98.5 ?F (36.9 ?C)  ?SpO2: 98%  ? ? ?BP Readings from Last 3 Encounters:  ?02/12/22 120/80  ?09/24/20 124/82  ?09/10/20 121/84  ? ?Wt Readings from Last 3 Encounters:  ?02/12/22 288 lb (130.6 kg)   ?01/11/22 275 lb (124.7 kg)  ?09/18/21 265 lb (120.2 kg)  ? ? ?Physical Exam ?Constitutional:   ?   General: She is not in acute distress. ?   Appearance: She is not diaphoretic.  ?Cardiovascular:  ?   Rate and Rhythm: Normal rate and regular rhythm.  ?   Heart sounds: Normal heart sounds.  ?Pulmonary:  ?   Effort: Pulmonary effort is normal.  ?   Breath sounds: Normal breath sounds.  ?Skin: ?   General: Skin is warm and dry.  ?Neurological:  ?   Mental Status: She is alert.  ? ? ? ?Assessment/Plan: Please see individual problem list. ? ?Problem List Items Addressed This Visit   ? ? ADHD  ?  Some benefit from the Adderall though discussed that we can increase the Adderall to 15 mg once daily to see if she gets additional benefit.  She will monitor for any side effects and let us know if those occur.  I will see her back in 4 weeks. ?  ?  ? Relevant Medications  ? amphetamine-dextroamphetamine (ADDERALL XR) 15 MG 24 hr capsule  ? Anxiety and depression  ?  Generally well controlled and stable.  She will continue Wellbutrin 300 mg once daily and Lexapro 20 mg daily. ?  ?  ? ? ? ?Return in about 4 weeks (around 03/12/2022) for ADHD. ? ?This visit occurred  during the SARS-CoV-2 public health emergency.  Safety protocols were in place, including screening questions prior to the visit, additional usage of staff PPE, and extensive cleaning of exam room while observing appropriate contact time as indicated for disinfecting solutions.  ? ? ?Tommi Rumps, MD ?[place] ? ?

## 2022-02-19 ENCOUNTER — Telehealth: Payer: BC Managed Care – PPO | Admitting: Family Medicine

## 2022-02-27 ENCOUNTER — Encounter: Payer: Self-pay | Admitting: Family Medicine

## 2022-02-27 DIAGNOSIS — F909 Attention-deficit hyperactivity disorder, unspecified type: Secondary | ICD-10-CM

## 2022-03-01 MED ORDER — AMPHETAMINE-DEXTROAMPHET ER 15 MG PO CP24: 15.0000 mg | ORAL_CAPSULE | Freq: Every day | ORAL | 0 refills | Status: DC

## 2022-03-03 ENCOUNTER — Other Ambulatory Visit: Payer: Self-pay

## 2022-03-03 MED ORDER — AMPHETAMINE-DEXTROAMPHET ER 15 MG PO CP24: 15.0000 mg | ORAL_CAPSULE | Freq: Every day | ORAL | 0 refills | Status: DC

## 2022-03-09 ENCOUNTER — Telehealth (INDEPENDENT_AMBULATORY_CARE_PROVIDER_SITE_OTHER): Payer: Commercial Managed Care - PPO | Admitting: Internal Medicine

## 2022-03-09 ENCOUNTER — Other Ambulatory Visit: Payer: Self-pay

## 2022-03-09 ENCOUNTER — Encounter: Payer: Self-pay | Admitting: Internal Medicine

## 2022-03-09 VITALS — Ht 62.0 in | Wt 279.0 lb

## 2022-03-09 DIAGNOSIS — B3731 Acute candidiasis of vulva and vagina: Secondary | ICD-10-CM | POA: Diagnosis not present

## 2022-03-09 DIAGNOSIS — H6690 Otitis media, unspecified, unspecified ear: Secondary | ICD-10-CM | POA: Diagnosis not present

## 2022-03-09 MED ORDER — AMOXICILLIN-POT CLAVULANATE 875-125 MG PO TABS: 1.0000 | ORAL_TABLET | Freq: Two times a day (BID) | ORAL | 0 refills | Status: DC

## 2022-03-09 MED ORDER — FLUCONAZOLE 150 MG PO TABS: 150.0000 mg | ORAL_TABLET | Freq: Once | ORAL | 0 refills | Status: AC

## 2022-03-09 NOTE — Progress Notes (Signed)
Virtual Visit via Video Note ? ?I connected with Kim Garcia ? on 03/09/22 at  4:02 by a video enabled telemedicine application and verified that I am speaking with the correct person using two identifiers. ? Location patient: [place] ?Location provider:work or home office ?Persons participating in the virtual visit: patient, provider ? ?I discussed the limitations and requested verbal permission for telemedicine visit. The patient expressed understanding and agreed to proceed. ? ? ?HPI: ? ?Acute telemedicine visit for : ?Co h/a, congestion, body aches x 2 days resolved x 2 days Friday and sat had fever to 103 F. She is having right > left ear pain and pain around her posterior/lateral neck on the right side worse with chewing and hears a popping sensation in her ear. Tried otc allegra qhs and sudafed and ibuprofen her oldest daughter had hand foot and mouth but she has no sx's of this. H/o tubes in her ears as a kid ?Home covid test negative  ?  ?-Pertinent past medical history: see below ?-Pertinent medication allergies: ?Allergies  ?Allergen Reactions  ? Tape Dermatitis  ?  Caused skin irritation, and tore skin when removing  ? ?-COVID-19 vaccine status:  ?Immunization History  ?Administered Date(s) Administered  ? Influenza-Unspecified 10/15/2017, 08/21/2018, 09/03/2020  ? ? ? ?ROS: See pertinent positives and negatives per HPI. ? ?Past Medical History:  ?Diagnosis Date  ? Anemia   ? Anxiety   ? Asthma   ? as child, rarely uses inhaler  ? Back pain   ? Bilateral swelling of feet   ? Depression   ? Depression   ? History of blood transfusion 05/2011  ? 1 unit transfused at [facility]  ? Joint pain   ? Migraines   ? Missed ab 2014  ? no surgery required  ? Obesity   ? Seasonal allergies   ? Sleep apnea   ? Sleep apnea   ? SVD (spontaneous vaginal delivery) 05/2011  ? x 1  ? Vitamin D deficiency   ? ? ?Past Surgical History:  ?Procedure Laterality Date  ? CESAREAN SECTION N/A 09/27/2014  ? Procedure: Primary CESAREAN SECTION;   Surgeon: Marvene Staff, MD;  Location: [facility] [facility];  Service: Obstetrics;  Laterality: N/A;  EDD: 10/04/14  ? CESAREAN SECTION WITH BILATERAL TUBAL LIGATION N/A 02/25/2017  ? Procedure: REPEAT CESAREAN SECTION WITH BILATERAL TUBAL LIGATION;  Surgeon: Servando Salina, MD;  Location: [facility];  Service: Obstetrics;  Laterality: N/A;  EDD: 02/28/17  ? WISDOM TOOTH EXTRACTION    ? ? ? ?Current Outpatient Medications:  ?  albuterol (VENTOLIN HFA) 108 (90 Base) MCG/ACT inhaler, INHALE 1 PUFF INTO THE LUNGS EVERY 4 (FOUR) HOURS AS NEEDED FOR WHEEZING OR SHORTNESS OF BREATH., Disp: 18 g, Rfl: 1 ?  amoxicillin-clavulanate (AUGMENTIN) 875-125 MG tablet, Take 1 tablet by mouth 2 (two) times daily. With food, Disp: 14 tablet, Rfl: 0 ?  amphetamine-dextroamphetamine (ADDERALL XR) 15 MG 24 hr capsule, Take 1 capsule by mouth daily. (Patient taking differently: Take 25 mg by mouth daily.), Disp: 30 capsule, Rfl: 0 ?  buPROPion (WELLBUTRIN XL) 300 MG 24 hr tablet, Take 1 tablet (300 mg total) by mouth daily., Disp: 90 tablet, Rfl: 2 ?  cetirizine (ZYRTEC) 10 MG tablet, Take 10 mg by mouth daily., Disp: , Rfl:  ?  escitalopram (LEXAPRO) 20 MG tablet, TAKE 1 TABLET BY MOUTH EVERY DAY, Disp: 90 tablet, Rfl: 0 ?  fluconazole (DIFLUCAN) 150 MG tablet, Take 1 tablet (150 mg total) by mouth once  for 1 dose. Repeat in 3 days prn, Disp: 2 tablet, Rfl: 0 ?  SUMAtriptan (IMITREX) 50 MG tablet, TAKE 1 TABLET (50 MG TOTAL) BY MOUTH EVERY 2 (TWO) HOURS AS NEEDED FOR MIGRAINE. MAY REPEAT IN 2 HOURS IF HEADACHE PERSISTS OR RECURS., Disp: 9 tablet, Rfl: 2 ?  tretinoin (RETIN-A) 0.025 % cream, Apply topically at bedtime., Disp: , Rfl:  ?  neomycin-polymyxin-hydrocortisone (CORTISPORIN) OTIC solution, Place 3 drops into the left ear 4 (four) times daily. (Patient not taking: Reported on 03/09/2022), Disp: 10 mL, Rfl: 0 ? ?EXAM: ? ?VITALS per patient if applicable: ? ?GENERAL: alert, oriented, appears well and in no acute  distress ? ?HEENT: atraumatic, conjunttiva clear, no obvious abnormalities on inspection of external nose and ears ? ?NECK: normal movements of the head and neck ? ?LUNGS: on inspection no signs of respiratory distress, breathing rate appears normal, no obvious gross SOB, gasping or wheezing ? ?CV: no obvious cyanosis ? ?MS: moves all visible extremities without noticeable abnormality ? ?PSYCH/NEURO: pleasant and cooperative, no obvious depression or anxiety, speech and thought processing grossly intact ? ?ASSESSMENT AND PLAN: ? ?Discussed the following assessment and plan: ? ?Acute otitis media, unspecified otitis media type right - Plan: amoxicillin-clavulanate (AUGMENTIN) 875-125 MG tablet bid x 7 days ?Prn tylenol, advil ? ?Yeast vaginitis if gets with Abx- Plan: fluconazole (DIFLUCAN) 150 MG tablet ? ?-we discussed possible serious and likely etiologies, options for evaluation and workup, limitations of telemedicine visit vs in person visit, treatment, treatment risks and precautions. Pt is agreeable to treatment via telemedicine at this moment.  ? ?  ?I discussed the assessment and treatment plan with the patient. The patient was provided an opportunity to ask questions and all were answered. The patient agreed with the plan and demonstrated an understanding of the instructions. ?  ? ?Time spent 20 min ?Nino Glow McLean-Scocuzza, MD   ?

## 2022-03-09 NOTE — Progress Notes (Signed)
Onset of symptoms last Thursday with headache, then Friday morning with congestion and body aches as well. Symptoms improved with OTC cold medications.  ? ?Still having pain in the right ear worse but left ear as well. Swelling in the throat/neck area.  ?

## 2022-03-12 ENCOUNTER — Ambulatory Visit: Payer: 59 | Admitting: Family Medicine

## 2022-03-23 ENCOUNTER — Ambulatory Visit: Payer: Commercial Managed Care - PPO | Admitting: Family Medicine

## 2022-03-23 ENCOUNTER — Encounter: Payer: Self-pay | Admitting: Family Medicine

## 2022-03-23 DIAGNOSIS — F32A Depression, unspecified: Secondary | ICD-10-CM

## 2022-03-23 DIAGNOSIS — F909 Attention-deficit hyperactivity disorder, unspecified type: Secondary | ICD-10-CM | POA: Diagnosis not present

## 2022-03-23 DIAGNOSIS — F419 Anxiety disorder, unspecified: Secondary | ICD-10-CM | POA: Diagnosis not present

## 2022-03-23 DIAGNOSIS — H66003 Acute suppurative otitis media without spontaneous rupture of ear drum, bilateral: Secondary | ICD-10-CM

## 2022-03-23 DIAGNOSIS — H6690 Otitis media, unspecified, unspecified ear: Secondary | ICD-10-CM | POA: Insufficient documentation

## 2022-03-23 MED ORDER — FLUTICASONE PROPIONATE 50 MCG/ACT NA SUSP: 2.0000 | Freq: Every day | NASAL | 6 refills | Status: AC

## 2022-03-23 MED ORDER — AMPHETAMINE-DEXTROAMPHET ER 15 MG PO CP24: 15.0000 mg | ORAL_CAPSULE | Freq: Every day | ORAL | 0 refills | Status: DC

## 2022-03-23 MED ORDER — AMPHETAMINE-DEXTROAMPHET ER 15 MG PO CP24: 15.0000 mg | ORAL_CAPSULE | ORAL | 0 refills | Status: DC

## 2022-03-23 NOTE — Assessment & Plan Note (Signed)
Well-controlled.  She will continue Wellbutrin 300 mg once daily and Lexapro 20 mg daily. ?

## 2022-03-23 NOTE — Assessment & Plan Note (Signed)
Generally well controlled.  She will continue Adderall XR 15 mg once daily.  Controlled substance database reviewed. ?

## 2022-03-23 NOTE — Assessment & Plan Note (Signed)
Much improved from previously.  She does still have some clear fluid behind her right TM.  No sign of infection at this time.  She will try Flonase.  If not beneficial she will let us know.  She can continue with her over-the-counter 24-hour allergy pill. ?

## 2022-03-23 NOTE — Patient Instructions (Signed)
Nice to see you. ?I sent Flonase in.  If it is not beneficial for your ear over the next week please let me know. ?

## 2022-03-23 NOTE — Progress Notes (Signed)
?[name], MD ?Phone: [phone] ? ?VALMAI Kim Garcia is a 36 y.o. female who presents today for follow-up. ? ?Otitis media: Patient reports she was recently treated for otitis media with Augmentin.  She notes her symptoms are significantly improved though she does still feel quite a bit of fullness in her right ear.  She finished the antibiotics sometime in the last week. ? ?Anxiety/depression: Patient reports these are controlled.  She is on Wellbutrin and Lexapro.  No SI or HI. ? ?ADHD ?Medication: Adderall XR 15 mg once daily ?Effectiveness: Yes, has helped with impulsive eating, she still working on her work schedule to see if that will help as well ?Palpitations: No ?Sleep difficulty: Occasionally waking up in the middle the night though no trouble falling asleep ?Appetite suppression: No ? ? ?Social History  ? ?Tobacco Use  ?Smoking Status Never  ?Smokeless Tobacco Never  ? ? ?Current Outpatient Medications on File Prior to Visit  ?Medication Sig Dispense Refill  ? albuterol (VENTOLIN HFA) 108 (90 Base) MCG/ACT inhaler INHALE 1 PUFF INTO THE LUNGS EVERY 4 (FOUR) HOURS AS NEEDED FOR WHEEZING OR SHORTNESS OF BREATH. 18 g 1  ? amoxicillin-clavulanate (AUGMENTIN) 875-125 MG tablet Take 1 tablet by mouth 2 (two) times daily. With food 14 tablet 0  ? buPROPion (WELLBUTRIN XL) 300 MG 24 hr tablet Take 1 tablet (300 mg total) by mouth daily. 90 tablet 2  ? cetirizine (ZYRTEC) 10 MG tablet Take 10 mg by mouth daily.    ? escitalopram (LEXAPRO) 20 MG tablet TAKE 1 TABLET BY MOUTH EVERY DAY 90 tablet 0  ? neomycin-polymyxin-hydrocortisone (CORTISPORIN) OTIC solution Place 3 drops into the left ear 4 (four) times daily. 10 mL 0  ? SUMAtriptan (IMITREX) 50 MG tablet TAKE 1 TABLET (50 MG TOTAL) BY MOUTH EVERY 2 (TWO) HOURS AS NEEDED FOR MIGRAINE. MAY REPEAT IN 2 HOURS IF HEADACHE PERSISTS OR RECURS. 9 tablet 2  ? tretinoin (RETIN-A) 0.025 % cream Apply topically at bedtime.    ? ?No current facility-administered  medications on file prior to visit.  ? ? ? ?ROS see history of present illness ? ?Objective ? ?Physical Exam ?Vitals:  ? 03/23/22 1236  ?BP: 130/80  ?Pulse: 100  ?Temp: 98.6 ?F (37 ?C)  ?SpO2: 98%  ? ? ?BP Readings from Last 3 Encounters:  ?03/23/22 130/80  ?02/12/22 120/80  ?09/24/20 124/82  ? ?Wt Readings from Last 3 Encounters:  ?03/23/22 280 lb 6.4 oz (127.2 kg)  ?03/09/22 279 lb (126.6 kg)  ?02/12/22 288 lb (130.6 kg)  ? ? ?Physical Exam ?Constitutional:   ?   General: She is not in acute distress. ?   Appearance: She is not diaphoretic.  ?HENT:  ?   Left Ear: Tympanic membrane normal.  ?   Ears:  ?   Comments: Clear fluid behind the right TM, there is no bulging, very slight erythema to the right TM ?Cardiovascular:  ?   Rate and Rhythm: Normal rate and regular rhythm.  ?   Heart sounds: Normal heart sounds.  ?Pulmonary:  ?   Effort: Pulmonary effort is normal.  ?   Breath sounds: Normal breath sounds.  ?Skin: ?   General: Skin is warm and dry.  ?Neurological:  ?   Mental Status: She is alert.  ? ? ? ?Assessment/Plan: Please see individual problem list. ? ?Problem List Items Addressed This Visit   ? ? ADHD (Chronic)  ?  Generally well controlled.  She will continue Adderall XR 15 mg  once daily.  Controlled substance database reviewed. ?  ?  ? Relevant Medications  ? amphetamine-dextroamphetamine (ADDERALL XR) 15 MG 24 hr capsule (Start on 06/03/2022)  ? amphetamine-dextroamphetamine (ADDERALL XR) 15 MG 24 hr capsule (Start on 05/03/2022)  ? amphetamine-dextroamphetamine (ADDERALL XR) 15 MG 24 hr capsule (Start on 04/03/2022)  ? Anxiety and depression (Chronic)  ?  Well-controlled.  She will continue Wellbutrin 300 mg once daily and Lexapro 20 mg daily. ?  ?  ? Otitis media  ?  Much improved from previously.  She does still have some clear fluid behind her right TM.  No sign of infection at this time.  She will try Flonase.  If not beneficial she will let us know.  She can continue with her over-the-counter  24-hour allergy pill. ?  ?  ? ? ? ?Return in about 3 months (around 06/22/2022) for ADHD. ? ?This visit occurred during the SARS-CoV-2 public health emergency.  Safety protocols were in place, including screening questions prior to the visit, additional usage of staff PPE, and extensive cleaning of exam room while observing appropriate contact time as indicated for disinfecting solutions.  ? ? ?[name], MD ?[facility] [place] ? ?

## 2022-04-01 ENCOUNTER — Telehealth: Payer: Self-pay

## 2022-04-01 NOTE — Telephone Encounter (Signed)
I received a fax that the adderall 15 mg is on backorder and I called and left a VM for patient to call around to other pharmacies and see who has that dose and call us back and we can send it to that pharmacy..  Ryosuke Ericksen,cma  ?

## 2022-04-01 NOTE — Telephone Encounter (Signed)
Pt called and I read the message to her and pt stated [facility] has ten pills available  ?

## 2022-04-02 NOTE — Telephone Encounter (Signed)
I called and spoke with the patient yesterday before I left and she stated she would search around to more pharmacies and let us know. Evanell Redlich,cma  ?

## 2022-04-06 ENCOUNTER — Encounter: Payer: Self-pay | Admitting: Family Medicine

## 2022-04-06 ENCOUNTER — Telehealth: Payer: Self-pay

## 2022-04-06 NOTE — Telephone Encounter (Signed)
I called and LVM to call back.  Lucien Budney,cma  ?

## 2022-04-06 NOTE — Telephone Encounter (Signed)
Noted. Has she been able to find a different pharmacy that has it in stock? ?

## 2022-04-06 NOTE — Telephone Encounter (Signed)
A fax came in for the patient stating that the patient amphetamine salts ER 15 mg capsule is on backorder at [facility].  Nikala Walsworth,cma  ?

## 2022-04-12 MED ORDER — METHYLPHENIDATE HCL ER (OSM) 36 MG PO TBCR: 36.0000 mg | EXTENDED_RELEASE_TABLET | Freq: Every day | ORAL | 0 refills | Status: DC

## 2022-04-13 NOTE — Telephone Encounter (Signed)
I called and spoke with the patient and she was driving, she stated she would call back to schedule a follow up visit in 1 month about the new medication.  Rahul Malinak,cma  ?

## 2022-05-05 MED ORDER — AMPHETAMINE-DEXTROAMPHETAMINE 5 MG PO TABS: 5.0000 mg | ORAL_TABLET | Freq: Two times a day (BID) | ORAL | 0 refills | Status: DC

## 2022-06-21 ENCOUNTER — Ambulatory Visit: Payer: Commercial Managed Care - PPO | Admitting: Family Medicine

## 2022-06-21 ENCOUNTER — Encounter: Payer: Self-pay | Admitting: Family Medicine

## 2022-06-21 DIAGNOSIS — F909 Attention-deficit hyperactivity disorder, unspecified type: Secondary | ICD-10-CM

## 2022-06-21 DIAGNOSIS — Z6841 Body Mass Index (BMI) 40.0 and over, adult: Secondary | ICD-10-CM | POA: Diagnosis not present

## 2022-06-21 DIAGNOSIS — F32A Depression, unspecified: Secondary | ICD-10-CM

## 2022-06-21 DIAGNOSIS — F419 Anxiety disorder, unspecified: Secondary | ICD-10-CM

## 2022-06-21 MED ORDER — ESCITALOPRAM OXALATE 20 MG PO TABS: 20.0000 mg | ORAL_TABLET | Freq: Every day | ORAL | 3 refills | Status: DC

## 2022-06-21 MED ORDER — AMPHETAMINE-DEXTROAMPHETAMINE 5 MG PO TABS: 5.0000 mg | ORAL_TABLET | Freq: Two times a day (BID) | ORAL | 0 refills | Status: DC

## 2022-06-21 MED ORDER — BUPROPION HCL ER (XL) 300 MG PO TB24: 300.0000 mg | ORAL_TABLET | Freq: Every day | ORAL | 2 refills | Status: DC

## 2022-06-21 NOTE — Assessment & Plan Note (Signed)
I encouraged adding in exercise several days a week.  Discussed healthy diet.

## 2022-06-21 NOTE — Progress Notes (Signed)
[name], MD Phone: [phone]  Kim Garcia is a 36 y.o. female who presents today for f/u.  ADHD Medication: adderall IR 5 mg BID, sometimes misses the afternoon dose Effectiveness: yes Palpitations: no Sleep difficulty: only if she takes the afternoon dose too late Appetite suppression: no  Obesity: Patient notes she has not been exercising though she has been more active than in the past.  She is not as sedentary.  She does eat out more than she used to though she is not anything as large volume.  Does have soda occasionally.   Social History   Tobacco Use  Smoking Status Never  Smokeless Tobacco Never    Current Outpatient Medications on File Prior to Visit  Medication Sig Dispense Refill   albuterol (VENTOLIN HFA) 108 (90 Base) MCG/ACT inhaler INHALE 1 PUFF INTO THE LUNGS EVERY 4 (FOUR) HOURS AS NEEDED FOR WHEEZING OR SHORTNESS OF BREATH. 18 g 1   cetirizine (ZYRTEC) 10 MG tablet Take 10 mg by mouth daily.     fluticasone (FLONASE) 50 MCG/ACT nasal spray Place 2 sprays into both nostrils daily. 16 g 6   SUMAtriptan (IMITREX) 50 MG tablet TAKE 1 TABLET (50 MG TOTAL) BY MOUTH EVERY 2 (TWO) HOURS AS NEEDED FOR MIGRAINE. MAY REPEAT IN 2 HOURS IF HEADACHE PERSISTS OR RECURS. 9 tablet 2   tretinoin (RETIN-A) 0.025 % cream Apply topically at bedtime.     No current facility-administered medications on file prior to visit.     ROS see history of present illness  Objective  Physical Exam Vitals:   06/21/22 1557 06/21/22 1608  BP: 140/90 130/80  Pulse: 96   Temp: 97.6 F (36.4 C)   SpO2: 99%     BP Readings from Last 3 Encounters:  06/21/22 130/80  03/23/22 130/80  02/12/22 120/80   Wt Readings from Last 3 Encounters:  06/21/22 290 lb 3.2 oz (131.6 kg)  03/23/22 280 lb 6.4 oz (127.2 kg)  03/09/22 279 lb (126.6 kg)    Physical Exam Constitutional:      General: She is not in acute distress.    Appearance: She is not diaphoretic.  Cardiovascular:      Rate and Rhythm: Normal rate and regular rhythm.     Heart sounds: Normal heart sounds.  Pulmonary:     Effort: Pulmonary effort is normal.     Breath sounds: Normal breath sounds.  Skin:    General: Skin is warm and dry.  Neurological:     Mental Status: She is alert.      Assessment/Plan: Please see individual problem list.  Problem List Items Addressed This Visit     ADHD (Chronic)    Adequately controlled.  She will continue Adderall 5 mg twice daily.  Encouraged to take the afternoon dose a little earlier so she does not have sleep issues.      Relevant Medications   amphetamine-dextroamphetamine (ADDERALL) 5 MG tablet (Start on 08/22/2022)   amphetamine-dextroamphetamine (ADDERALL) 5 MG tablet (Start on 07/22/2022)   amphetamine-dextroamphetamine (ADDERALL) 5 MG tablet   Anxiety and depression (Chronic)   Relevant Medications   buPROPion (WELLBUTRIN XL) 300 MG 24 hr tablet   escitalopram (LEXAPRO) 20 MG tablet   Class 3 severe obesity with serious comorbidity and body mass index (BMI) of 45.0 to 49.9 in adult [facility]) (Chronic)    I encouraged adding in exercise several days a week.  Discussed healthy diet.      Relevant Medications   amphetamine-dextroamphetamine (ADDERALL)  5 MG tablet (Start on 08/22/2022)   amphetamine-dextroamphetamine (ADDERALL) 5 MG tablet (Start on 07/22/2022)   amphetamine-dextroamphetamine (ADDERALL) 5 MG tablet     Return in about 3 months (around 09/21/2022) for ADHD.   [name], MD [facility]

## 2022-06-21 NOTE — Patient Instructions (Signed)
Nice to see you. I refilled your Adderall and your Wellbutrin and Lexapro. Please try to add in some exercise 3 days a week.

## 2022-06-21 NOTE — Assessment & Plan Note (Signed)
Adequately controlled.  She will continue Adderall 5 mg twice daily.  Encouraged to take the afternoon dose a little earlier so she does not have sleep issues.

## 2022-06-27 ENCOUNTER — Encounter: Payer: Self-pay | Admitting: Family Medicine

## 2022-06-28 ENCOUNTER — Other Ambulatory Visit: Payer: Self-pay | Admitting: Family

## 2022-06-28 DIAGNOSIS — F909 Attention-deficit hyperactivity disorder, unspecified type: Secondary | ICD-10-CM

## 2022-06-28 MED ORDER — AMPHETAMINE-DEXTROAMPHETAMINE 5 MG PO TABS: 5.0000 mg | ORAL_TABLET | Freq: Two times a day (BID) | ORAL | 0 refills | Status: DC

## 2022-07-21 ENCOUNTER — Encounter (INDEPENDENT_AMBULATORY_CARE_PROVIDER_SITE_OTHER): Payer: Self-pay

## 2022-09-26 ENCOUNTER — Other Ambulatory Visit: Payer: Self-pay | Admitting: Family Medicine

## 2022-09-26 DIAGNOSIS — F909 Attention-deficit hyperactivity disorder, unspecified type: Secondary | ICD-10-CM

## 2022-09-27 MED ORDER — AMPHETAMINE-DEXTROAMPHETAMINE 5 MG PO TABS: 5.0000 mg | ORAL_TABLET | Freq: Two times a day (BID) | ORAL | 0 refills | Status: DC

## 2022-09-27 NOTE — Telephone Encounter (Signed)
This patient is due for follow-up.  She should be following up every 3 months given that she is on the Adderall.  I will send 1 refill in though she will need to schedule follow-up for future refills to be sent in.  Thanks.

## 2022-09-28 NOTE — Telephone Encounter (Signed)
I called and LVM for the patient to call back and schedule a visit with the provider for any future refills.  Geneveive Furness,cma

## 2022-10-17 ENCOUNTER — Other Ambulatory Visit: Payer: Self-pay | Admitting: Family Medicine

## 2022-10-17 DIAGNOSIS — F32A Depression, unspecified: Secondary | ICD-10-CM

## 2022-11-19 ENCOUNTER — Encounter: Payer: Self-pay | Admitting: Family Medicine

## 2022-11-19 ENCOUNTER — Ambulatory Visit: Payer: Commercial Managed Care - PPO | Admitting: Family Medicine

## 2022-11-19 VITALS — BP 140/90 | HR 90 | Temp 99.6°F | Ht 62.0 in | Wt 293.2 lb

## 2022-11-19 DIAGNOSIS — F32A Depression, unspecified: Secondary | ICD-10-CM | POA: Diagnosis not present

## 2022-11-19 DIAGNOSIS — R030 Elevated blood-pressure reading, without diagnosis of hypertension: Secondary | ICD-10-CM | POA: Diagnosis not present

## 2022-11-19 DIAGNOSIS — F419 Anxiety disorder, unspecified: Secondary | ICD-10-CM

## 2022-11-19 DIAGNOSIS — F909 Attention-deficit hyperactivity disorder, unspecified type: Secondary | ICD-10-CM

## 2022-11-19 NOTE — Patient Instructions (Signed)
Lets have you come back to have your BP checked in a week or so and we can see if we are able to go up on the adderall at that time.  Please try to check your BP at home when you have had a chance to rest for 10-15 minutes.

## 2022-11-19 NOTE — Assessment & Plan Note (Signed)
Well-controlled. Continue to take Wellbutrin 300 mg once daily and Lexapro 20 mg daily.

## 2022-11-19 NOTE — Assessment & Plan Note (Signed)
Possibly related to recent stress of losing her grandmother though could be from the adderall. I will bring her back in 1 week for recheck and then consider the next steps in management at that time.

## 2022-11-19 NOTE — Progress Notes (Signed)
Patient seen along with medical student Zada Zhong.  I personally evaluated this patient along with the student, and verified all aspects of the history, physical exam, and medical decision making as documented by the student.  I agree with the student's documentation and have made all necessary edits.  Petronella Shuford, MD  

## 2022-11-19 NOTE — Progress Notes (Signed)
[name], MD Phone: [phone]  Kim Garcia is a 36 y.o. female who presents today for f/u.  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 However, she was written up at work this week for recurrent time management issues and returning paperwork on time, and this is the second time that this has happened. She lost her grandmother a couple of weeks ago and overall feels overwhelmed with work, taking care of her three kids, and going to family functions. Her grandmother's funeral is scheduled for the next day. She believes that her recurrent time management and task completion issues are unrelated to her grandmother's death as they have been longstanding. She wonders if she can go up on her Adderall dose. She denies any appetite suppression or insomnia while on the medication.  Anxiety/depression: Patient reports that this is overall better. She no longer has any panic attacks. She does notice a decline in her mood if she stops her Lexapro or Wellbutrin. She denies any SI.   Elevated BP: no chest pain or shortness of breath. Does not check her BP at home.    Social History   Tobacco Use  Smoking Status Never  Smokeless Tobacco Never    Current Outpatient Medications on File Prior to Visit  Medication Sig Dispense Refill   albuterol (VENTOLIN HFA) 108 (90 Base) MCG/ACT inhaler INHALE 1 PUFF INTO THE LUNGS EVERY 4 (FOUR) HOURS AS NEEDED FOR WHEEZING OR SHORTNESS OF BREATH. 18 g 1   amphetamine-dextroamphetamine (ADDERALL) 5 MG tablet Take 1 tablet (5 mg total) by mouth 2 (two) times daily with a meal. 60 tablet 0   amphetamine-dextroamphetamine (ADDERALL) 5 MG tablet Take 1 tablet (5 mg total) by mouth 2 (two) times daily with a meal. 60 tablet 0   amphetamine-dextroamphetamine (ADDERALL) 5 MG tablet Take 1 tablet (5 mg  total) by mouth 2 (two) times daily with a meal. 60 tablet 0   buPROPion (WELLBUTRIN XL) 300 MG 24 hr tablet TAKE 1 TABLET BY MOUTH EVERY DAY 90 tablet 2   cetirizine (ZYRTEC) 10 MG tablet Take 10 mg by mouth daily.     escitalopram (LEXAPRO) 20 MG tablet Take 1 tablet (20 mg total) by mouth daily. 90 tablet 3   fluticasone (FLONASE) 50 MCG/ACT nasal spray Place 2 sprays into both nostrils daily. 16 g 6   SUMAtriptan (IMITREX) 50 MG tablet TAKE 1 TABLET (50 MG TOTAL) BY MOUTH EVERY 2 (TWO) HOURS AS NEEDED FOR MIGRAINE. MAY REPEAT IN 2 HOURS IF HEADACHE PERSISTS OR RECURS. 9 tablet 2   tretinoin (RETIN-A) 0.025 % cream Apply topically at bedtime.     No current facility-administered medications on file prior to visit.     ROS see history of present illness  Objective  Vitals:   11/19/22 0935 11/19/22 0959  BP: (!) 130/100 (!) 140/90  Pulse: (!) 102 90  Temp: 99.6 F (37.6 C)   SpO2: 97%     BP Readings from Last 3 Encounters:  11/19/22 (!) 140/90  06/21/22 130/80  03/23/22 130/80   Wt Readings from Last 3 Encounters:  11/19/22 293 lb 3.2 oz (133 kg)  06/21/22 290 lb 3.2 oz (131.6 kg)  03/23/22 280 lb 6.4 oz (127.2 kg)    Physical Exam Constitutional:      Appearance: Normal  appearance.  HENT:     Head: Normocephalic and atraumatic.  Cardiovascular:     Rate and Rhythm: Normal rate and regular rhythm.  Pulmonary:     Effort: Pulmonary effort is normal.     Breath sounds: Normal breath sounds.  Skin:    General: Skin is warm and dry.  Neurological:     Mental Status: She is alert.  Psychiatric:        Mood and Affect: Affect is tearful.   Heart rate on physical exam: 90   Assessment/Plan: Please see individual problem list.  Problem List Items Addressed This Visit     ADHD (Chronic)    Not adequately controlled per patient. Patient wishes to go up from her 5mg  dose of Adderall. Patient had HR of 102 and blood pressure of 140/90 at her visit today. Discussed  with patient that her vitals may not be her baseline since she is grieving, but an increase in Adderall may exacerbate her abnormal vitals. Given her tachycardia and hypertension today, we will recheck her vitals in a week and then assess if she can then go up on her Adderall. At this time she will stay on adderall 5 mg BID.      Anxiety and depression - Primary (Chronic)    Well-controlled. Continue to take Wellbutrin 300 mg once daily and Lexapro 20 mg daily.       Elevated BP without diagnosis of hypertension    Possibly related to recent stress of losing her grandmother though could be from the adderall. I will bring her back in 1 week for recheck and then consider the next steps in management at that time.          Return in about 1 week (around 11/26/2022) for BP check with nurse, 1 month PCP.   11/28/2022, Medical Student [facility]

## 2022-11-19 NOTE — Assessment & Plan Note (Addendum)
Not adequately controlled per patient. Patient wishes to go up from her 5mg  dose of Adderall. Patient had HR of 102 and blood pressure of 140/90 at her visit today. Discussed with patient that her vitals may not be her baseline since she is grieving, but an increase in Adderall may exacerbate her abnormal vitals. Given her tachycardia and hypertension today, we will recheck her vitals in a week and then assess if she can then go up on her Adderall. At this time she will stay on adderall 5 mg BID.

## 2022-12-30 ENCOUNTER — Encounter: Payer: Self-pay | Admitting: Family Medicine

## 2022-12-30 ENCOUNTER — Ambulatory Visit (INDEPENDENT_AMBULATORY_CARE_PROVIDER_SITE_OTHER): Payer: Commercial Managed Care - PPO

## 2022-12-30 ENCOUNTER — Telehealth: Payer: Self-pay | Admitting: Internal Medicine

## 2022-12-30 VITALS — BP 140/88 | HR 98

## 2022-12-30 DIAGNOSIS — R030 Elevated blood-pressure reading, without diagnosis of hypertension: Secondary | ICD-10-CM

## 2022-12-30 DIAGNOSIS — F909 Attention-deficit hyperactivity disorder, unspecified type: Secondary | ICD-10-CM

## 2022-12-30 NOTE — Telephone Encounter (Signed)
Kim Garcia came in Thursday 12/30/22 for blood pressure check.  I signed off on the note, but I wanted to forward the information to you.  I did not make any changes in medication.  Here is the message:    "Pt presented for blood pressure check. BP at last office visit was 140/90. Patients blood pressure today in the office is 140/88 pulse 98 in the right arm and 140/90 pulse 92 in the left arm. Pt is not symptomatic. Pt is not currently taking any blood pressure medications. Pt also stated that the nurse visit was requested because Kim Garcia was wanting to adjust her Adderall dose."

## 2022-12-30 NOTE — Progress Notes (Signed)
Pt presented for blood pressure check. BP at last office visit was 140/90. Patients blood pressure today in the office is 140/88 pulse 98 in the right arm and 140/90 pulse 92 in the left arm. Pt is not symptomatic. Pt is not currently taking any blood pressure medications. Pt also stated that the nurse visit was requested because Dr. Caryl Bis was wanting to adjust her Adderall dose.

## 2022-12-31 MED ORDER — AMPHETAMINE-DEXTROAMPHETAMINE 5 MG PO TABS: 5.0000 mg | ORAL_TABLET | Freq: Two times a day (BID) | ORAL | 0 refills | Status: DC

## 2022-12-31 MED ORDER — 3 SERIES BP MONITOR/UPPER ARM DEVI: 0 refills | Status: AC

## 2022-12-31 NOTE — Telephone Encounter (Signed)
Noted. Certainly I would not increase the adderall with her BP remaining borderline elevated. Can you see if she has been checking her BP at home? It looks like she has been out of her adderall so her current dose is likely not causing issues with her BP.

## 2022-12-31 NOTE — Telephone Encounter (Signed)
Please see the phone note on this person. I sent her adderall in.

## 2022-12-31 NOTE — Addendum Note (Signed)
Addended by: Leone Haven on: 12/31/2022 09:37 AM   Modules accepted: Orders

## 2022-12-31 NOTE — Telephone Encounter (Signed)
I sent it in for her though I would be surprised if her insurance covers it. She could get an omron upper arm BP monitor.

## 2022-12-31 NOTE — Telephone Encounter (Signed)
I called and spoke with the patient and she stated she does not have a BP monitor at home and would like the provider to send on to her pharmacy.  She also stated she would like to go back on her current dose of the adderall and would like for the provider to send that in for her.  I informed her that if she gets the monitor for her to take her BP for 1-2 weeks and send it through mychart to the provider.  Sabine Tenenbaum,cma

## 2022-12-31 NOTE — Telephone Encounter (Signed)
Left Patient a message about BP monitor.

## 2023-01-24 ENCOUNTER — Ambulatory Visit: Payer: Commercial Managed Care - PPO | Admitting: Family Medicine

## 2023-01-28 ENCOUNTER — Ambulatory Visit: Payer: Commercial Managed Care - PPO | Admitting: Family Medicine

## 2023-01-28 ENCOUNTER — Encounter: Payer: Self-pay | Admitting: Family Medicine

## 2023-01-28 VITALS — BP 136/88 | HR 81 | Temp 97.6°F | Resp 16 | Ht 62.0 in | Wt 286.1 lb

## 2023-01-28 DIAGNOSIS — F32A Depression, unspecified: Secondary | ICD-10-CM

## 2023-01-28 DIAGNOSIS — F909 Attention-deficit hyperactivity disorder, unspecified type: Secondary | ICD-10-CM | POA: Diagnosis not present

## 2023-01-28 DIAGNOSIS — F419 Anxiety disorder, unspecified: Secondary | ICD-10-CM | POA: Diagnosis not present

## 2023-01-28 DIAGNOSIS — R030 Elevated blood-pressure reading, without diagnosis of hypertension: Secondary | ICD-10-CM | POA: Diagnosis not present

## 2023-01-28 NOTE — Progress Notes (Signed)
Kim Rumps, MD Phone: [phone]  Kim Garcia is a 37 y.o. female who presents today for f/u.  ADHD Medication: adderall Effectiveness: notes it is ok, though could be better Palpitations: no Sleep difficulty: no Appetite suppression: no  Elevated BP: has been checking at home some, though notes the cuff was too small and kept popping off. No CP or SOB.  Anxiety and depression: notes this is stable and adequately controlled. Work is better than it has been. She continues on wellbutrin and lexapro.    Social History   Tobacco Use  Smoking Status Never  Smokeless Tobacco Never    Current Outpatient Medications on File Prior to Visit  Medication Sig Dispense Refill   albuterol (VENTOLIN HFA) 108 (90 Base) MCG/ACT inhaler INHALE 1 PUFF INTO THE LUNGS EVERY 4 (FOUR) HOURS AS NEEDED FOR WHEEZING OR SHORTNESS OF BREATH. 18 g 1   amphetamine-dextroamphetamine (ADDERALL) 5 MG tablet Take 1 tablet (5 mg total) by mouth 2 (two) times daily with a meal. 60 tablet 0   amphetamine-dextroamphetamine (ADDERALL) 5 MG tablet Take 1 tablet (5 mg total) by mouth 2 (two) times daily with a meal. 60 tablet 0   amphetamine-dextroamphetamine (ADDERALL) 5 MG tablet Take 1 tablet (5 mg total) by mouth 2 (two) times daily with a meal. 60 tablet 0   Blood Pressure Monitoring (3 SERIES BP MONITOR/UPPER ARM) DEVI Use to check BP once daily. 1 each 0   buPROPion (WELLBUTRIN XL) 300 MG 24 hr tablet TAKE 1 TABLET BY MOUTH EVERY DAY 90 tablet 2   cetirizine (ZYRTEC) 10 MG tablet Take 10 mg by mouth daily.     escitalopram (LEXAPRO) 20 MG tablet Take 1 tablet (20 mg total) by mouth daily. 90 tablet 3   fluticasone (FLONASE) 50 MCG/ACT nasal spray Place 2 sprays into both nostrils daily. 16 g 6   SUMAtriptan (IMITREX) 50 MG tablet TAKE 1 TABLET (50 MG TOTAL) BY MOUTH EVERY 2 (TWO) HOURS AS NEEDED FOR MIGRAINE. MAY REPEAT IN 2 HOURS IF HEADACHE PERSISTS OR RECURS. 9 tablet 2   tretinoin (RETIN-A) 0.025 %  cream Apply topically at bedtime.     No current facility-administered medications on file prior to visit.     ROS see history of present illness  Objective  Physical Exam Vitals:   01/28/23 1538  BP: 136/88  Pulse: 81  Resp: 16  Temp: 97.6 F (36.4 C)  SpO2: 99%    BP Readings from Last 3 Encounters:  01/28/23 136/88  12/30/22 (!) 140/88  11/19/22 (!) 140/90   Wt Readings from Last 3 Encounters:  01/28/23 286 lb 2 oz (129.8 kg)  11/19/22 293 lb 3.2 oz (133 kg)  06/21/22 290 lb 3.2 oz (131.6 kg)    Physical Exam Constitutional:      General: She is not in acute distress.    Appearance: She is not diaphoretic.  Cardiovascular:     Rate and Rhythm: Normal rate and regular rhythm.     Heart sounds: Normal heart sounds.  Pulmonary:     Effort: Pulmonary effort is normal.     Breath sounds: Normal breath sounds.  Skin:    General: Skin is warm and dry.  Neurological:     Mental Status: She is alert.      Assessment/Plan: Please see individual problem list.  Anxiety and depression Assessment & Plan: Chronic issue. Adequately controlled. Continue wellbutrin 300 mg daily and lexapro 20 mg daily.    Attention deficit hyperactivity disorder (  ADHD), unspecified ADHD type Assessment & Plan: Chronic issue. Not adequately controlled though BP is limiting this. For now she will continue adderall 5 mg BID for this. We could go up on this if her BP at home is well controlled with a bigger cuff.    Elevated BP without diagnosis of hypertension Assessment & Plan: She will buy a bigger cuff and see what her BP is. She will send me her readings in a couple of weeks.      Return in about 3 months (around 04/28/2023) for adhd.   Kim Rumps, MD [place]

## 2023-01-28 NOTE — Assessment & Plan Note (Signed)
Chronic issue. Adequately controlled. Continue wellbutrin 300 mg daily and lexapro 20 mg daily.

## 2023-01-28 NOTE — Assessment & Plan Note (Signed)
Chronic issue. Not adequately controlled though BP is limiting this. For now she will continue adderall 5 mg BID for this. We could go up on this if her BP at home is well controlled with a bigger cuff.

## 2023-01-28 NOTE — Assessment & Plan Note (Signed)
She will buy a bigger cuff and see what her BP is. She will send me her readings in a couple of weeks.

## 2023-03-30 ENCOUNTER — Other Ambulatory Visit: Payer: Self-pay | Admitting: Family Medicine

## 2023-03-30 DIAGNOSIS — F909 Attention-deficit hyperactivity disorder, unspecified type: Secondary | ICD-10-CM

## 2023-03-31 MED ORDER — AMPHETAMINE-DEXTROAMPHETAMINE 5 MG PO TABS: 5.0000 mg | ORAL_TABLET | Freq: Two times a day (BID) | ORAL | 0 refills | Status: DC

## 2023-05-06 ENCOUNTER — Ambulatory Visit: Payer: Commercial Managed Care - PPO | Admitting: Family Medicine

## 2023-07-11 ENCOUNTER — Other Ambulatory Visit: Payer: Self-pay | Admitting: Family Medicine

## 2023-07-11 DIAGNOSIS — F419 Anxiety disorder, unspecified: Secondary | ICD-10-CM

## 2023-07-11 NOTE — Telephone Encounter (Signed)
Patient needs follow-up scheduled. Refill sent to pharmacy, though please get her scheduled.

## 2023-09-11 ENCOUNTER — Ambulatory Visit (INDEPENDENT_AMBULATORY_CARE_PROVIDER_SITE_OTHER): Payer: Commercial Managed Care - PPO

## 2023-09-11 ENCOUNTER — Ambulatory Visit
Admission: EM | Admit: 2023-09-11 | Discharge: 2023-09-11 | Disposition: A | Discharge status: Home / Self Care | Payer: Commercial Managed Care - PPO | Attending: Physician Assistant | Admitting: Physician Assistant

## 2023-09-11 DIAGNOSIS — J45901 Unspecified asthma with (acute) exacerbation: Secondary | ICD-10-CM | POA: Diagnosis not present

## 2023-09-11 DIAGNOSIS — R051 Acute cough: Secondary | ICD-10-CM

## 2023-09-11 DIAGNOSIS — J180 Bronchopneumonia, unspecified organism: Secondary | ICD-10-CM

## 2023-09-11 MED ORDER — PREDNISONE 50 MG PO TABS: 50.0000 mg | ORAL_TABLET | Freq: Every day | ORAL | 0 refills | Status: AC

## 2023-09-11 MED ORDER — PROMETHAZINE-DM 6.25-15 MG/5ML PO SYRP: 5.0000 mL | ORAL_SOLUTION | Freq: Four times a day (QID) | ORAL | 0 refills | Status: DC | PRN

## 2023-09-11 MED ORDER — DOXYCYCLINE HYCLATE 100 MG PO CAPS: 100.0000 mg | ORAL_CAPSULE | Freq: Two times a day (BID) | ORAL | 0 refills | Status: AC

## 2023-09-11 NOTE — ED Provider Notes (Signed)
[facility]    CSN: [id number] Arrival date & time: 09/11/23  4098      History   Chief Complaint Chief Complaint  Patient presents with   Cough   Shortness of Breath    HPI Kim Garcia is a 37 y.o. female presenting for 1 week history of cough, congestion, chest tightness.  Patient says she was diagnosed with COVID 2 weeks ago and symptoms got better and then worse again over the past few days.  Now she reports her cough is productive, her chest is tight and she is having increased wheezing and shortness of breath from baseline.  Has a history of asthma.  Has been using inhaler and taking Mucinex.  Reports low-grade fever 100 degrees a couple days ago.  Reports symptoms are getting worse and she thought she should get checked out.  HPI  Past Medical History:  Diagnosis Date   Anemia    Anxiety    Asthma    as child, rarely uses inhaler   Back pain    Bilateral swelling of feet    Depression    Depression    History of blood transfusion 05/2011   1 unit transfused at [facility]   Joint pain    Migraines    Missed ab 2014   no surgery required   Obesity    Seasonal allergies    Sleep apnea    Sleep apnea    SVD (spontaneous vaginal delivery) 05/2011   x 1   Vitamin D deficiency     Patient Active Problem List   Diagnosis Date Noted   Elevated BP without diagnosis of hypertension 11/19/2022   Otitis media 03/23/2022   ADHD 01/11/2022   Insulin resistance 09/25/2020   OSA (obstructive sleep apnea) 09/25/2020   Vitamin D deficiency 06/16/2018   Class 3 severe obesity with serious comorbidity and body mass index (BMI) of 45.0 to 49.9 in adult [facility]) 02/02/2018   Migraine 08/19/2017   Anxiety and depression 04/01/2016    Past Surgical History:  Procedure Laterality Date   CESAREAN SECTION N/A 09/27/2014   Procedure: Primary CESAREAN SECTION;  Surgeon: Serita Kyle, MD;  Location: [facility];  Service: Obstetrics;  Laterality: N/A;  EDD: 10/04/14    CESAREAN SECTION WITH BILATERAL TUBAL LIGATION N/A 02/25/2017   Procedure: REPEAT CESAREAN SECTION WITH BILATERAL TUBAL LIGATION;  Surgeon: Maxie Better, MD;  Location: [facility];  Service: Obstetrics;  Laterality: N/A;  EDD: 02/28/17   WISDOM TOOTH EXTRACTION      OB History     Gravida  4   Para  3   Term  3   Preterm      AB  1   Living  1      SAB  1   IAB      Ectopic      Multiple  0   Live Births  1            Home Medications    Prior to Admission medications   Medication Sig Start Date End Date Taking? Authorizing Provider  albuterol (VENTOLIN HFA) 108 (90 Base) MCG/ACT inhaler INHALE 1 PUFF INTO THE LUNGS EVERY 4 (FOUR) HOURS AS NEEDED FOR WHEEZING OR SHORTNESS OF BREATH. 05/21/20  Yes Glori Luis, MD  amphetamine-dextroamphetamine (ADDERALL) 5 MG tablet Take 1 tablet (5 mg total) by mouth 2 (two) times daily with a meal. 06/28/22  Yes Worthy Rancher B, FNP  amphetamine-dextroamphetamine (ADDERALL) 5 MG tablet  Take 1 tablet (5 mg total) by mouth 2 (two) times daily with a meal. 09/27/22  Yes Glori Luis, MD  amphetamine-dextroamphetamine (ADDERALL) 5 MG tablet Take 1 tablet (5 mg total) by mouth 2 (two) times daily with a meal. 03/31/23  Yes Glori Luis, MD  Blood Pressure Monitoring (3 SERIES BP MONITOR/UPPER ARM) DEVI Use to check BP once daily. 12/31/22  Yes Glori Luis, MD  buPROPion (WELLBUTRIN XL) 300 MG 24 hr tablet TAKE 1 TABLET BY MOUTH EVERY DAY 10/18/22  Yes Glori Luis, MD  cetirizine (ZYRTEC) 10 MG tablet Take 10 mg by mouth daily.   Yes [provider]  doxycycline (VIBRAMYCIN) 100 MG capsule Take 1 capsule (100 mg total) by mouth 2 (two) times daily for 7 days. 09/11/23 09/18/23 Yes Eusebio Friendly B, PA-C  escitalopram (LEXAPRO) 20 MG tablet TAKE 1 TABLET(20 MG) BY MOUTH DAILY 07/11/23  Yes Glori Luis, MD  predniSONE (DELTASONE) 50 MG tablet Take 1 tablet (50 mg total) by mouth daily for 5  days. 09/11/23 09/16/23 Yes Shirlee Latch, PA-C  promethazine-dextromethorphan (PROMETHAZINE-DM) 6.25-15 MG/5ML syrup Take 5 mLs by mouth 4 (four) times daily as needed. 09/11/23  Yes Eusebio Friendly B, PA-C  SUMAtriptan (IMITREX) 50 MG tablet TAKE 1 TABLET (50 MG TOTAL) BY MOUTH EVERY 2 (TWO) HOURS AS NEEDED FOR MIGRAINE. MAY REPEAT IN 2 HOURS IF HEADACHE PERSISTS OR RECURS. 01/09/21  Yes Glori Luis, MD  fluticasone (FLONASE) 50 MCG/ACT nasal spray Place 2 sprays into both nostrils daily. 03/23/22   Glori Luis, MD  tretinoin (RETIN-A) 0.025 % cream Apply topically at bedtime.    [provider]    Family History Family History  Problem Relation Age of Onset   Alcoholism Other        Parent   Colon cancer Maternal Grandmother    Diabetes Maternal Grandmother    Stroke Paternal Grandmother    Diabetes Paternal Grandmother    Depression Mother    Anxiety disorder Mother    Diabetes Father    Alcoholism Father    Alcohol abuse Father    Obesity Father     Social History Social History   Tobacco Use   Smoking status: Never   Smokeless tobacco: Never  Vaping Use   Vaping status: Never Used  Substance Use Topics   Alcohol use: No   Drug use: No     Allergies   Tape   Review of Systems Review of Systems  Constitutional:  Positive for fatigue and fever. Negative for chills and diaphoresis.  HENT:  Positive for congestion. Negative for ear pain, rhinorrhea, sinus pressure, sinus pain and sore throat.   Respiratory:  Positive for cough, chest tightness, shortness of breath and wheezing.   Cardiovascular:  Negative for chest pain.  Gastrointestinal:  Negative for abdominal pain, nausea and vomiting.  Musculoskeletal:  Negative for arthralgias and myalgias.  Skin:  Negative for rash.  Neurological:  Negative for weakness and headaches.  Hematological:  Negative for adenopathy.     Physical Exam Triage Vital Signs ED Triage Vitals  Encounter Vitals  Group     BP      Systolic BP Percentile      Diastolic BP Percentile      Pulse      Resp      Temp      Temp src      SpO2      Weight  Height      Head Circumference      Peak Flow      Pain Score      Pain Loc      Pain Education      Exclude from Growth Chart    No data found.  Updated Vital Signs BP (!) 138/101 (BP Location: Left Arm)   Pulse 83   Temp 98.4 F (36.9 C) (Oral)   Ht 5\' 1"  (1.549 m)   Wt 254 lb (115.2 kg)   LMP 08/11/2023   SpO2 97%   BMI 47.99 kg/m      Physical Exam Vitals and nursing note reviewed.  Constitutional:      General: She is not in acute distress.    Appearance: Normal appearance. She is ill-appearing. She is not toxic-appearing.  HENT:     Head: Normocephalic and atraumatic.     Nose: Nose normal.     Mouth/Throat:     Mouth: Mucous membranes are moist.     Pharynx: Oropharynx is clear.  Eyes:     General: No scleral icterus.       Right eye: No discharge.        Left eye: No discharge.     Conjunctiva/sclera: Conjunctivae normal.  Cardiovascular:     Rate and Rhythm: Normal rate and regular rhythm.     Heart sounds: Normal heart sounds.  Pulmonary:     Effort: Pulmonary effort is normal. No respiratory distress.     Breath sounds: Wheezing and rhonchi present.  Musculoskeletal:     Cervical back: Neck supple.  Skin:    General: Skin is dry.  Neurological:     General: No focal deficit present.     Mental Status: She is alert. Mental status is at baseline.     Motor: No weakness.     Gait: Gait normal.  Psychiatric:        Mood and Affect: Mood normal.        Behavior: Behavior normal.        Thought Content: Thought content normal.      UC Treatments / Results  Labs (all labs ordered are listed, but only abnormal results are displayed) Labs Reviewed - No data to display  EKG   Radiology DG Chest 2 View  Result Date: 09/11/2023 CLINICAL DATA:  Cough with shortness of breath for 1 week. COVID  infection last month. EXAM: CHEST - 2 VIEW COMPARISON:  None Available. FINDINGS: The heart size and mediastinal contours are normal. There is mild central airway thickening without evidence of airspace disease, edema, pleural effusion or pneumothorax. No acute osseous findings are evident. Mild convex right thoracic scoliosis and spondylosis. IMPRESSION: Mild central airway thickening. No evidence of pneumonia or other acute cardiopulmonary process. Electronically Signed   By: Carey Bullocks M.D.   On: 09/11/2023 09:33    Procedures Procedures (including critical care time)  Medications Ordered in UC Medications - No data to display  Initial Impression / Assessment and Plan / UC Course  I have reviewed the triage vital signs and the nursing notes.  Pertinent labs & imaging results that were available during my care of the patient were reviewed by me and considered in my medical decision making (see chart for details).   37 year old female presents for 1 week history of low-grade fever, fatigue, cough, congestion, chest tightness and shortness of breath.  Had COVID a couple weeks ago and symptoms improved and then recently worsened again.  Has been using albuterol inhalers and taking Mucinex without relief.  She is afebrile.  Ill-appearing but nontoxic.  On exam has wheezing and rhonchi throughout.  Chest x-ray obtained today shows some suspicion for right-sided pneumonia.  Waiting on official overread but will treat patient for developing pneumonia with doxycycline.  Also sent prednisone, Promethazine DM and encouraged her to continue with inhalers and rest.  Reviewed return precautions.   Final Clinical Impressions(s) / UC Diagnoses   Final diagnoses:  Acute cough  Asthma with acute exacerbation, unspecified asthma severity, unspecified whether persistent  Bronchopneumonia     Discharge Instructions      -Your x-ray is concerning for pneumonia.  I sent antibiotics to pharmacy. -  Your asthma is flared up.  I sent prednisone. - I also sent cough medicine. - Continue use of your inhalers and increased rest and fluids. - When I get the report of the x-ray I will contact you. - You should repeat this x-ray in about 4 weeks to make sure everything is cleared up.     ED Prescriptions     Medication Sig Dispense Auth. Provider   predniSONE (DELTASONE) 50 MG tablet Take 1 tablet (50 mg total) by mouth daily for 5 days. 5 tablet Shirlee Latch, PA-C   promethazine-dextromethorphan (PROMETHAZINE-DM) 6.25-15 MG/5ML syrup Take 5 mLs by mouth 4 (four) times daily as needed. 118 mL Eusebio Friendly B, PA-C   doxycycline (VIBRAMYCIN) 100 MG capsule Take 1 capsule (100 mg total) by mouth 2 (two) times daily for 7 days. 14 capsule Shirlee Latch, PA-C      PDMP not reviewed this encounter.   Shirlee Latch, PA-C 09/11/23 [phone]

## 2023-09-11 NOTE — Discharge Instructions (Addendum)
-  Your x-ray is concerning for pneumonia.  I sent antibiotics to pharmacy. - Your asthma is flared up.  I sent prednisone. - I also sent cough medicine. - Continue use of your inhalers and increased rest and fluids. - When I get the report of the x-ray I will contact you. - You should repeat this x-ray in about 4 weeks to make sure everything is cleared up.

## 2023-09-11 NOTE — ED Triage Notes (Addendum)
Pt c/o worsening cough, SOB, Chest tightness x6days  Pt has asthma and has been using albuterol   Pt had covid 3 weeks ago

## 2024-02-14 ENCOUNTER — Other Ambulatory Visit: Payer: Self-pay | Admitting: Family Medicine

## 2024-02-14 ENCOUNTER — Telehealth: Payer: Self-pay

## 2024-02-14 DIAGNOSIS — F909 Attention-deficit hyperactivity disorder, unspecified type: Secondary | ICD-10-CM

## 2024-02-14 DIAGNOSIS — F419 Anxiety disorder, unspecified: Secondary | ICD-10-CM

## 2024-02-14 MED ORDER — BUPROPION HCL ER (XL) 300 MG PO TB24: 300.0000 mg | ORAL_TABLET | Freq: Every day | ORAL | 0 refills | Status: DC

## 2024-02-14 NOTE — Telephone Encounter (Signed)
 Copied from CRM [id number]. Topic: Clinical - Medication Refill >> Feb 14, 2024 10:36 AM [place] J wrote: Most Recent Primary Care Visit:  Provider: Birdie Sons, ERIC G  Department: [facility]  Visit Type: OFFICE VISIT  Date: 01/28/2023  Medication:  buPROPion (WELLBUTRIN XL) 300 MG 24 hr tablet amphetamine-dextroamphetamine (ADDERALL) 5 MG tablet  Has the patient contacted their pharmacy? Yes (Agent: If no, request that the patient contact the pharmacy for the refill. If patient does not wish to contact the pharmacy document the reason why and proceed with request.) (Agent: If yes, when and what did the pharmacy advise?) Patient was instructed to call clinic.   Is this the correct pharmacy for this prescription? Yes If no, delete pharmacy and type the correct one.  This is the patient's preferred pharmacy:  [id number] [place], [place] - [address] AT [facility] [address] [place] [address] Phone: [phone] Fax: [phone]  [id number] - [place], [place] - [address] [place] [address] Phone: [phone] Fax: [phone]   Has the prescription been filled recently? No  Is the patient out of the medication? Yes  Has the patient been seen for an appointment in the last year OR does the patient have an upcoming appointment? Yes  Can we respond through MyChart? Yes  Agent: Please be advised that Rx refills may take up to 3 business days. We ask that you follow-up with your pharmacy.

## 2024-02-14 NOTE — Telephone Encounter (Signed)
 Copied from CRM [id number]. Topic: Appointments - Transfer of Care >> Feb 14, 2024 10:34 AM [place] J wrote: Pt is requesting to transfer FROM: Kim Garcia Pt is requesting to transfer TO: Kim Garcia Reason for requested transfer: Provider retired. It is the responsibility of the team the patient would like to transfer to (Dr. Jason Coop) to reach out to the patient if for any reason this transfer is not acceptable.

## 2024-02-14 NOTE — Telephone Encounter (Signed)
 Last Fill: Wellbutrin: 10/18/22     Adderall: 03/31/23 60 tabs/0 RF  Last OV: 01/28/23 Next OV: 02/20/24  Routing to provider for review/authorization.

## 2024-02-15 ENCOUNTER — Telehealth: Payer: Self-pay

## 2024-02-15 NOTE — Telephone Encounter (Signed)
 I left a voicemail for patient letting her know that her appointment with Rennie Plowman, FNP, on 02/20/2024 is actually a transfer of care appointment, not a physical.  I let patient know that I have made the change in our system.  I asked patient to please let us know if she has any questions.

## 2024-02-15 NOTE — Telephone Encounter (Signed)
 NOTED

## 2024-02-15 NOTE — Telephone Encounter (Signed)
Error

## 2024-02-20 ENCOUNTER — Encounter: Payer: Self-pay | Admitting: Family

## 2024-02-20 ENCOUNTER — Ambulatory Visit: Admitting: Family

## 2024-02-20 VITALS — BP 130/76 | Temp 98.2°F | Ht 62.0 in | Wt 243.2 lb

## 2024-02-20 DIAGNOSIS — F419 Anxiety disorder, unspecified: Secondary | ICD-10-CM | POA: Diagnosis not present

## 2024-02-20 DIAGNOSIS — G4733 Obstructive sleep apnea (adult) (pediatric): Secondary | ICD-10-CM

## 2024-02-20 DIAGNOSIS — F909 Attention-deficit hyperactivity disorder, unspecified type: Secondary | ICD-10-CM

## 2024-02-20 DIAGNOSIS — F32A Depression, unspecified: Secondary | ICD-10-CM | POA: Diagnosis not present

## 2024-02-20 MED ORDER — LISDEXAMFETAMINE DIMESYLATE 10 MG PO CAPS
10.0000 mg | ORAL_CAPSULE | Freq: Every day | ORAL | 0 refills | Status: DC
Start: 2024-02-20 — End: 2024-03-15

## 2024-02-20 MED ORDER — ESCITALOPRAM OXALATE 20 MG PO TABS: 20.0000 mg | ORAL_TABLET | Freq: Every day | ORAL | 3 refills | Status: AC

## 2024-02-20 MED ORDER — BUPROPION HCL ER (XL) 300 MG PO TB24: 300.0000 mg | ORAL_TABLET | Freq: Every day | ORAL | 3 refills | Status: AC

## 2024-02-20 NOTE — Progress Notes (Unsigned)
 Assessment & Plan:  Attention deficit hyperactivity disorder (ADHD), unspecified ADHD type Assessment & Plan: Reviewed formal testing 2023.  Discussed suboptimal control, side effects of Adderall.  We jointly agreed a trial of Vyvanse is appropriate.  Discontinue Adderall at this time.  Close follow-up  Orders: -     Lisdexamfetamine Dimesylate; Take 1 capsule (10 mg total) by mouth daily.  Dispense: 30 capsule; Refill: 0  Anxiety and depression Assessment & Plan: Suboptimal control.  Discussed adjuncting Lexapro 20 mg with Wellbutrin 150mg . Consider weaning down/off lexapro if she feels is not particularly effective.   Orders: -     buPROPion HCl ER (XL); Take 1 tablet (300 mg total) by mouth daily.  Dispense: 90 tablet; Refill: 3 -     Escitalopram Oxalate; Take 1 tablet (20 mg total) by mouth daily.  Dispense: 90 tablet; Refill: 3  OSA (obstructive sleep apnea) Assessment & Plan: Significant weight loss of 50 pounds.  Pending referral to pulmonology for repeat sleep study.  Discussed Zepbound and approval for OSA.  May consider changing from semaglutide [employer] (consulting)) to [employer] due to cost, safety.   Orders: -     Ambulatory referral to Pulmonology     Return precautions given.   Risks, benefits, and alternatives of the medications and treatment plan prescribed today were discussed, and patient expressed understanding.   Education regarding symptom management and diagnosis given to patient on AVS either electronically or printed.  Return in about 3 months (around 05/22/2024) for Complete Physical Exam.  Rennie Plowman, FNP  Subjective:    Patient ID: Kim Garcia, female    DOB: 05-Nov-1986, 38 y.o.   MRN: [id number]  CC: Kim Garcia is a 38 y.o. female who presents today for follow up.   HPI: Medication Follow up    She would refills of adderall today. Endorses increased depression and anxiety of late. She would like refills of lexapro.   Denies SI.   At  times she questions if adderall can affect sleep or exacerbate depression.    Formally diagnosed with ADHD Kim Muster, phD in 2023 met criteria for ADHD 12/18/21 ( in Epic)    She started semaglutide 2.4mg  through [place]. Overall doing well on medication and lost 50lbs. H/o OSA.   Denies personal or family history of thyroid cancer, MEN.  No history of seizure, anorexia, bulimia, alcohol use Allergies: Tape Current Outpatient Medications on File Prior to Visit  Medication Sig Dispense Refill   albuterol (VENTOLIN HFA) 108 (90 Base) MCG/ACT inhaler INHALE 1 PUFF INTO THE LUNGS EVERY 4 (FOUR) HOURS AS NEEDED FOR WHEEZING OR SHORTNESS OF BREATH. 18 g 1   amphetamine-dextroamphetamine (ADDERALL) 5 MG tablet Take 1 tablet (5 mg total) by mouth 2 (two) times daily with a meal. (Patient not taking: Reported on 02/22/2024) 60 tablet 0   amphetamine-dextroamphetamine (ADDERALL) 5 MG tablet Take 1 tablet (5 mg total) by mouth 2 (two) times daily with a meal. (Patient not taking: Reported on 02/22/2024) 60 tablet 0   amphetamine-dextroamphetamine (ADDERALL) 5 MG tablet Take 1 tablet (5 mg total) by mouth 2 (two) times daily with a meal. (Patient not taking: Reported on 02/22/2024) 60 tablet 0   Blood Pressure Monitoring (3 SERIES BP MONITOR/UPPER ARM) DEVI Use to check BP once daily. (Patient not taking: Reported on 02/22/2024) 1 each 0   cetirizine (ZYRTEC) 10 MG tablet Take 10 mg by mouth daily.     Semaglutide-Weight Management 2.4 MG/0.75ML SOAJ Inject 2.4 mg  into the skin.     SUMAtriptan (IMITREX) 50 MG tablet TAKE 1 TABLET (50 MG TOTAL) BY MOUTH EVERY 2 (TWO) HOURS AS NEEDED FOR MIGRAINE. MAY REPEAT IN 2 HOURS IF HEADACHE PERSISTS OR RECURS. 9 tablet 2   tretinoin (RETIN-A) 0.025 % cream Apply topically at bedtime.     fluticasone (FLONASE) 50 MCG/ACT nasal spray Place 2 sprays into both nostrils daily. (Patient not taking: Reported on 02/22/2024) 16 g 6   promethazine-dextromethorphan  (PROMETHAZINE-DM) 6.25-15 MG/5ML syrup Take 5 mLs by mouth 4 (four) times daily as needed. (Patient not taking: Reported on 02/22/2024) 118 mL 0   No current facility-administered medications on file prior to visit.    Review of Systems  Constitutional:  Negative for chills, fatigue and fever.  Respiratory:  Negative for cough.   Cardiovascular:  Negative for chest pain and palpitations.  Gastrointestinal:  Negative for nausea and vomiting.  Psychiatric/Behavioral:  Positive for sleep disturbance. Negative for suicidal ideas. The patient is nervous/anxious.       Objective:    BP 130/76   Temp 98.2 F (36.8 C) (Oral)   Ht 5\' 2"  (1.575 m)   Wt 243 lb 3.2 oz (110.3 kg)   SpO2 98%   BMI 44.48 kg/m  BP Readings from Last 3 Encounters:  02/22/24 134/86  02/20/24 130/76  09/11/23 (!) 138/101   Wt Readings from Last 3 Encounters:  02/22/24 240 lb 12.8 oz (109.2 kg)  02/20/24 243 lb 3.2 oz (110.3 kg)  09/11/23 254 lb (115.2 kg)      02/20/2024   11:35 AM 11/19/2022    9:37 AM 06/21/2022    4:00 PM  Depression screen PHQ 2/9  Decreased Interest 2 0 0  Down, Depressed, Hopeless 1 0 0  PHQ - 2 Score 3 0 0  Altered sleeping 1    Tired, decreased energy 2    Change in appetite 1    Feeling bad or failure about yourself  1    Trouble concentrating 2    Moving slowly or fidgety/restless 0    Suicidal thoughts 0    PHQ-9 Score 10    Difficult doing work/chores Somewhat difficult       Physical Exam Vitals reviewed.  Constitutional:      Appearance: She is well-developed.  Eyes:     Conjunctiva/sclera: Conjunctivae normal.  Cardiovascular:     Rate and Rhythm: Normal rate and regular rhythm.     Pulses: Normal pulses.     Heart sounds: Normal heart sounds.  Pulmonary:     Effort: Pulmonary effort is normal.     Breath sounds: Normal breath sounds. No wheezing, rhonchi or rales.  Skin:    General: Skin is warm and dry.  Neurological:     Mental Status: She is alert.   Psychiatric:        Speech: Speech normal.        Behavior: Behavior normal.        Thought Content: Thought content normal.

## 2024-02-20 NOTE — Patient Instructions (Addendum)
 Lets consider Zepbound for sleep apnea.   Stop Adderall.  Trial of Vyvanse with titration. Start Wellbutrin 150mg  in the morning.   Referral to pulmonology.  Let us know if you dont hear back within a week in regards to an appointment being scheduled.   So that you are aware, if you are [facility] MyChart user , please pay attention to your MyChart messages as you may receive a MyChart message with a phone number to call and schedule this test/appointment own your own from our referral coordinator. This is a new process so I do not want you to miss this message.  If you are not a MyChart user, you will receive a phone call.    Nice seeing you today!

## 2024-02-22 ENCOUNTER — Ambulatory Visit: Admitting: Sleep Medicine

## 2024-02-22 ENCOUNTER — Encounter: Payer: Self-pay | Admitting: Sleep Medicine

## 2024-02-22 ENCOUNTER — Encounter: Payer: Self-pay | Admitting: Family

## 2024-02-22 VITALS — BP 134/86 | HR 85 | Temp 97.3°F | Ht 62.0 in | Wt 240.8 lb

## 2024-02-22 DIAGNOSIS — Z6841 Body Mass Index (BMI) 40.0 and over, adult: Secondary | ICD-10-CM

## 2024-02-22 DIAGNOSIS — G4733 Obstructive sleep apnea (adult) (pediatric): Secondary | ICD-10-CM

## 2024-02-22 DIAGNOSIS — F411 Generalized anxiety disorder: Secondary | ICD-10-CM

## 2024-02-22 NOTE — Patient Instructions (Signed)

## 2024-02-22 NOTE — Assessment & Plan Note (Signed)
 Significant weight loss of 50 pounds.  Pending referral to pulmonology for repeat sleep study.  Discussed Zepbound and approval for OSA.  May consider changing from semaglutide [employer], [employer] (consulting)) to [employer] due to cost, safety.

## 2024-02-22 NOTE — Assessment & Plan Note (Signed)
 Reviewed formal testing 2023.  Discussed suboptimal control, side effects of Adderall.  We jointly agreed a trial of Vyvanse is appropriate.  Discontinue Adderall at this time.  Close follow-up

## 2024-02-22 NOTE — Assessment & Plan Note (Signed)
 Suboptimal control.  Discussed adjuncting Lexapro 20 mg with Wellbutrin 150mg . Consider weaning down/off lexapro if she feels is not particularly effective.

## 2024-02-22 NOTE — Progress Notes (Signed)
 Name:Kim Garcia MRN: [id number] DOB: 1986/11/02   CHIEF COMPLAINT:  Establish care for OSA   HISTORY OF PRESENT ILLNESS:  [name] is a 38 y.o. w/ a h/o OSA, morbid obesity, anxiety and depression who presents to establish care for OSA. Reports that she was initially diagnosed with severe OSA (AHI 114, O2 nadir 73%) and subsequently started on CPAP therapy set to 11 cm H2O. Reports using CPAP therapy consistently since initial diagnosis of OSA until just a few months ago. Reports decreased CPAP usage due to feeling bloated and feeling like pressure was too high. Reports significant weight fluctuations over the last few years, has recently lost 50 lbs. Reports feeling more refreshed upon awakening with CPAP therapy. Denies snoring with CPAP therapy. Reports nocturnal awakenings due to nocturia, however does not have difficulty falling back to sleep. Admits to dry mouth and morning headaches. Denies RLS symptoms, dream enactment, cataplexy, hypnagogic or hypnapompic hallucinations. Denies a family history of sleep apnea. Denies drowsy driving. Drinks 1-2 sodas daily, denies alcohol, tobacco or illicit drug use.   Bedtime 10-11 pm Sleep onset 15 mins Rise time 6:30-7 am   EPWORTH SLEEP SCORE 11    02/22/2024    1:00 PM 06/14/2018   11:00 AM  Results of the Epworth flowsheet  Sitting and reading 2 2  Watching TV 2 1  Sitting, inactive in a public place (e.g. a theatre or a meeting) 1 1  As a passenger in a car for an hour without a break 2 2  Lying down to rest in the afternoon when circumstances permit 3 3  Sitting and talking to someone 0 1  Sitting quietly after a lunch without alcohol 1 2  In a car, while stopped for a few minutes in traffic 0 1  Total score 11 13     PAST MEDICAL HISTORY :   has a past medical history of Anemia, Anxiety, Asthma, Back pain, Bilateral swelling of feet, Depression, Depression, History of blood transfusion (05/2011), Joint pain, Migraines,  Missed ab (2014), Obesity, Seasonal allergies, Sleep apnea, Sleep apnea, SVD (spontaneous vaginal delivery) (05/2011), and Vitamin D deficiency.  has a past surgical history that includes Wisdom tooth extraction; Cesarean section (N/A, 09/27/2014); and Cesarean section with bilateral tubal ligation (N/A, 02/25/2017). Prior to Admission medications   Medication Sig Start Date End Date Taking? Authorizing Provider  albuterol (VENTOLIN HFA) 108 (90 Base) MCG/ACT inhaler INHALE 1 PUFF INTO THE LUNGS EVERY 4 (FOUR) HOURS AS NEEDED FOR WHEEZING OR SHORTNESS OF BREATH. 05/21/20  Yes Glori Luis, MD  buPROPion (WELLBUTRIN XL) 300 MG 24 hr tablet Take 1 tablet (300 mg total) by mouth daily. 02/20/24  Yes Arnett, Lyn Records, FNP  cetirizine (ZYRTEC) 10 MG tablet Take 10 mg by mouth daily.   Yes [provider]  escitalopram (LEXAPRO) 20 MG tablet Take 1 tablet (20 mg total) by mouth daily. 02/20/24  Yes Arnett, Lyn Records, FNP  lisdexamfetamine (VYVANSE) 10 MG capsule Take 1 capsule (10 mg total) by mouth daily. 02/20/24  Yes Allegra Grana, FNP  Semaglutide-Weight Management 2.4 MG/0.75ML SOAJ Inject 2.4 mg into the skin.   Yes [provider]  SUMAtriptan (IMITREX) 50 MG tablet TAKE 1 TABLET (50 MG TOTAL) BY MOUTH EVERY 2 (TWO) HOURS AS NEEDED FOR MIGRAINE. MAY REPEAT IN 2 HOURS IF HEADACHE PERSISTS OR RECURS. 01/09/21  Yes Glori Luis, MD  tretinoin (RETIN-A) 0.025 % cream Apply topically at bedtime.  Yes [provider]  amphetamine-dextroamphetamine (ADDERALL) 5 MG tablet Take 1 tablet (5 mg total) by mouth 2 (two) times daily with a meal. Patient not taking: Reported on 02/22/2024 06/28/22   Eulis Foster, FNP  amphetamine-dextroamphetamine (ADDERALL) 5 MG tablet Take 1 tablet (5 mg total) by mouth 2 (two) times daily with a meal. Patient not taking: Reported on 02/22/2024 09/27/22   Glori Luis, MD  amphetamine-dextroamphetamine (ADDERALL) 5 MG tablet Take 1  tablet (5 mg total) by mouth 2 (two) times daily with a meal. Patient not taking: Reported on 02/22/2024 03/31/23   Glori Luis, MD  Blood Pressure Monitoring (3 SERIES BP MONITOR/UPPER ARM) DEVI Use to check BP once daily. Patient not taking: Reported on 02/22/2024 12/31/22   Glori Luis, MD  fluticasone [facility]) 50 MCG/ACT nasal spray Place 2 sprays into both nostrils daily. Patient not taking: Reported on 02/22/2024 03/23/22   Glori Luis, MD  promethazine-dextromethorphan (PROMETHAZINE-DM) 6.25-15 MG/5ML syrup Take 5 mLs by mouth 4 (four) times daily as needed. Patient not taking: Reported on 02/22/2024 09/11/23   Shirlee Latch, PA-C   Allergies  Allergen Reactions   Tape Dermatitis    Caused skin irritation, and tore skin when removing    FAMILY HISTORY:  family history includes Alcohol abuse in her father; Alcoholism in her father and another family member; Anxiety disorder in her mother; Colon cancer in her maternal grandmother; Depression in her mother; Diabetes in her father, maternal grandmother, and paternal grandmother; Fibrocystic breast disease in her mother; Obesity in her father; Stroke (age of onset: 39) in her paternal grandmother. SOCIAL HISTORY:  reports that she has never smoked. She has never used smokeless tobacco. She reports that she does not drink alcohol and does not use drugs.   Review of Systems:  Gen:  Denies  fever, sweats, chills weight loss  HEENT: Denies blurred vision, double vision, ear pain, eye pain, hearing loss, nose bleeds, sore throat Cardiac:  No dizziness, chest pain or heaviness, chest tightness,edema, No JVD Resp:   No cough, -sputum production, -shortness of breath,-wheezing, -hemoptysis,  Gi: Denies swallowing difficulty, stomach pain, nausea or vomiting, diarrhea, constipation, bowel incontinence Gu:  Denies bladder incontinence, burning urine Ext:   Denies Joint pain, stiffness or swelling Skin: Denies  skin rash, easy  bruising or bleeding or hives Endoc:  Denies polyuria, polydipsia , polyphagia or weight change Psych:   Denies depression, insomnia or hallucinations  Other:  All other systems negative  VITAL SIGNS: BP 134/86 (BP Location: Right Wrist, Cuff Size: Large)   Pulse 85   Temp (!) 97.3 F (36.3 C)   Ht 5\' 2"  (1.575 m)   Wt 240 lb 12.8 oz (109.2 kg)   SpO2 97%   BMI 44.04 kg/m    Physical Examination:   General Appearance: No distress  EYES PERRLA, EOM intact.   NECK Supple, No JVD Mallampati IV Pulmonary: normal breath sounds, No wheezing.  CardiovascularNormal S1,S2.  No m/r/g.   Abdomen: Benign, Soft, non-tender. Skin:   warm, no rashes, no ecchymosis  Extremities: normal, no cyanosis, clubbing. Neuro:without focal findings,  speech normal  PSYCHIATRIC: Mood, affect within normal limits.   ASSESSMENT AND PLAN  OSA Reviewed CPAP compliance with patient. Due to pressure intolerance, decreased CPAP pressure to 10 cm H2O. Discussed the consequences of untreated sleep apnea. Advised not to drive drowsy for safety of patient and others. Will follow up in 3 months to review CPAP efficacy and compliance  data.    Anxiety  Stable, on current management. Following with PCP.   Morbid obesity Counseled patient on diet and lifestyle modification.    Patient  satisfied with Plan of action and management. All questions answered  Will follow up in 3 months to review CPAP efficacy and compliance data.   I spent a total of 47 minutes reviewing chart data, face-to-face evaluation with the patient, counseling and coordination of care as detailed above.    Tempie Hoist, M.D.  Sleep Medicine [facility]

## 2024-02-27 ENCOUNTER — Encounter: Payer: Self-pay | Admitting: Family

## 2024-03-15 ENCOUNTER — Other Ambulatory Visit: Payer: Self-pay | Admitting: Family

## 2024-03-15 DIAGNOSIS — F909 Attention-deficit hyperactivity disorder, unspecified type: Secondary | ICD-10-CM

## 2024-03-15 MED ORDER — LISDEXAMFETAMINE DIMESYLATE 30 MG PO CAPS
30.0000 mg | ORAL_CAPSULE | Freq: Every day | ORAL | 0 refills | Status: DC
Start: 2024-03-15 — End: 2024-06-22

## 2024-03-15 MED ORDER — LISDEXAMFETAMINE DIMESYLATE 30 MG PO CAPS: 30.0000 mg | ORAL_CAPSULE | Freq: Every day | ORAL | 0 refills | Status: DC

## 2024-05-25 ENCOUNTER — Encounter: Payer: Self-pay | Admitting: Sleep Medicine

## 2024-05-25 ENCOUNTER — Ambulatory Visit: Admitting: Sleep Medicine

## 2024-05-25 VITALS — BP 130/80 | HR 91 | Temp 98.1°F | Ht 61.5 in | Wt 232.8 lb

## 2024-05-25 DIAGNOSIS — F32A Depression, unspecified: Secondary | ICD-10-CM

## 2024-05-25 DIAGNOSIS — G4733 Obstructive sleep apnea (adult) (pediatric): Secondary | ICD-10-CM | POA: Diagnosis not present

## 2024-05-25 DIAGNOSIS — F419 Anxiety disorder, unspecified: Secondary | ICD-10-CM | POA: Diagnosis not present

## 2024-05-25 NOTE — Patient Instructions (Addendum)
 Patient Instructions  Continue to use CPAP every night, minimum of 7-8 hours a night.  Change equipment every 30 days or as directed by DME.  Wash your tubing with warm soap and water weekly, hang to dry. Wash humidifier portion weekly. Use bottled, distilled water and change daily   Be aware of reduced alertness and do not drive or operate heavy machinery if experiencing this or drowsiness.  Exercise encouraged, as tolerated. Encouraged proper weight management.  Important to get eight or more hours of sleep  Limiting the use of the computer and television before bedtime.  Decrease naps during the day, so night time sleep will become enhanced.  Limit caffeine, and sleep deprivation.  HTN, stroke, uncontrolled diabetes and heart failure are potential risk factors.  Risk of untreated sleep apnea including cardiac arrhthymias, stroke, DM, pulm HTN.

## 2024-05-25 NOTE — Progress Notes (Signed)
 Name:Kim Garcia MRN: [id number] DOB: [birth year]   CHIEF COMPLAINT:  CPAP F/U   HISTORY OF PRESENT ILLNESS:  Kim Garcia is a 38 y.o. w/ a h/o OSA, ADD, anxiety, depression and obesity who presents for CPAP f/u visit. Reports intermittent CPAP usage due to increased life stressors and more life responsibilities. Reports that the pressure change from last visit has improved comfort overall. She is currently using a full face mask, which occasionally leaks as well.    EPWORTH SLEEP SCORE    02/22/2024    1:00 PM 06/14/2018   11:00 AM  Results of the Epworth flowsheet  Sitting and reading 2 2  Watching TV 2 1  Sitting, inactive in a public place (e.g. a theatre or a meeting) 1 1  As a passenger in a car for an hour without a break 2 2  Lying down to rest in the afternoon when circumstances permit 3 3  Sitting and talking to someone 0 1  Sitting quietly after a lunch without alcohol 1 2  In a car, while stopped for a few minutes in traffic 0 1  Total score 11 13     PAST MEDICAL HISTORY :   has a past medical history of Anemia, Anxiety, Asthma, Back pain, Bilateral swelling of feet, Depression, Depression, History of blood transfusion (05/2011), Joint pain, Migraines, Missed ab (2014), Obesity, Seasonal allergies, Sleep apnea, Sleep apnea, SVD (spontaneous vaginal delivery) (05/2011), and Vitamin D  deficiency.  has a past surgical history that includes Wisdom tooth extraction; Cesarean section (N/A, 09/27/2014); and Cesarean section with bilateral tubal ligation (N/A, 02/25/2017). Prior to Admission medications   Medication Sig Start Date End Date Taking? Authorizing Provider  albuterol  (VENTOLIN  HFA) 108 (90 Base) MCG/ACT inhaler INHALE 1 PUFF INTO THE LUNGS EVERY 4 (FOUR) HOURS AS NEEDED FOR WHEEZING OR SHORTNESS OF BREATH. 05/21/20  Yes Kent Pear, MD  Blood Pressure Monitoring (3 SERIES BP MONITOR/UPPER ARM) DEVI Use to check BP once daily. Patient taking differently: as  needed. Use to check BP once daily. 12/31/22  Yes Kent Pear, MD  buPROPion  (WELLBUTRIN  XL) 300 MG 24 hr tablet Take 1 tablet (300 mg total) by mouth daily. 02/20/24  Yes Arnett, Hanley Lew, FNP  cetirizine (ZYRTEC) 10 MG tablet Take 10 mg by mouth daily.   Yes [provider]  escitalopram  (LEXAPRO ) 20 MG tablet Take 1 tablet (20 mg total) by mouth daily. 02/20/24  Yes Calista Catching, FNP  fluticasone  (FLONASE ) 50 MCG/ACT nasal spray Place 2 sprays into both nostrils daily. Patient taking differently: Place 2 sprays into both nostrils as needed for rhinitis or allergies. 03/23/22  Yes Kent Pear, MD  lisdexamfetamine (VYVANSE ) 30 MG capsule Take 1 capsule (30 mg total) by mouth daily. 03/15/24  Yes Calista Catching, FNP  lisdexamfetamine (VYVANSE ) 30 MG capsule Take 1 capsule (30 mg total) by mouth daily. 03/15/24  Yes Calista Catching, FNP  lisdexamfetamine (VYVANSE ) 30 MG capsule Take 1 capsule (30 mg total) by mouth daily. 03/15/24  Yes Arnett, Hanley Lew, FNP  loratadine (CLARITIN) 10 MG tablet Take 10 mg by mouth daily as needed. 05/05/24  Yes [provider]  Semaglutide-Weight Management 2.4 MG/0.75ML SOAJ Inject 2.4 mg into the skin.   Yes [provider]  tretinoin (RETIN-A) 0.025 % cream Apply topically at bedtime.   Yes [provider]  promethazine -dextromethorphan (PROMETHAZINE -DM) 6.25-15 MG/5ML syrup Take 5 mLs by mouth 4 (four) times daily  as needed. Patient not taking: Reported on 05/25/2024 09/11/23   Nancy Axon B, PA-C  SUMAtriptan  (IMITREX ) 50 MG tablet TAKE 1 TABLET (50 MG TOTAL) BY MOUTH EVERY 2 (TWO) HOURS AS NEEDED FOR MIGRAINE. MAY REPEAT IN 2 HOURS IF HEADACHE PERSISTS OR RECURS. Patient not taking: Reported on 05/25/2024 01/09/21   Kent Pear, MD   Allergies  Allergen Reactions   Tape Dermatitis    Caused skin irritation, and tore skin when removing    FAMILY HISTORY:  family history includes Alcohol abuse in  her father; Alcoholism in her father and another family member; Anxiety disorder in her mother; Colon cancer in her maternal grandmother; Depression in her mother; Diabetes in her father, maternal grandmother, and paternal grandmother; Fibrocystic breast disease in her mother; Obesity in her father; Stroke (age of onset: 28) in her paternal grandmother. SOCIAL HISTORY:  reports that she has never smoked. She has never used smokeless tobacco. She reports that she does not drink alcohol and does not use drugs.   Review of Systems:  Gen:  Denies  fever, sweats, chills weight loss  HEENT: Denies blurred vision, double vision, ear pain, eye pain, hearing loss, nose bleeds, sore throat Cardiac:  No dizziness, chest pain or heaviness, chest tightness,edema, No JVD Resp:   No cough, -sputum production, -shortness of breath,-wheezing, -hemoptysis,  Gi: Denies swallowing difficulty, stomach pain, nausea or vomiting, diarrhea, constipation, bowel incontinence Gu:  Denies bladder incontinence, burning urine Ext:   Denies Joint pain, stiffness or swelling Skin: Denies  skin rash, easy bruising or bleeding or hives Endoc:  Denies polyuria, polydipsia , polyphagia or weight change Psych:   Denies depression, insomnia or hallucinations  Other:  All other systems negative  VITAL SIGNS: BP 130/80 (BP Location: Left Arm, Patient Position: Sitting, Cuff Size: Large)   Pulse 91   Temp 98.1 F (36.7 C) (Oral)   Ht 5' 1.5 (1.562 m)   Wt 232 lb 12.8 oz (105.6 kg)   SpO2 98%   BMI 43.27 kg/m     Physical Examination:   General Appearance: No distress  EYES PERRLA, EOM intact.   NECK Supple, No JVD Pulmonary: normal breath sounds, No wheezing.  CardiovascularNormal S1,S2.  No m/r/g.   Abdomen: Benign, Soft, non-tender. Skin:   warm, no rashes, no ecchymosis  Extremities: normal, no cyanosis, clubbing. Neuro:without focal findings,  speech normal  PSYCHIATRIC: Mood, affect within normal  limits.   ASSESSMENT AND PLAN  OSA Counseled patient on increasing PAP compliance and increasing total sleep time to 7-8 hours per night. Discussed the consequences of untreated sleep apnea. Advised not to drive drowsy for safety of patient and others. Will follow up in 3 months.     Anxiety and depression Stable, on current management. Following with PCP.    Patient  satisfied with Plan of action and management. All questions answered  I spent a total of 35 minutes reviewing chart data, face-to-face evaluation with the patient, counseling and coordination of care as detailed above.    Kairah Leoni, M.D.  Sleep Medicine [facility]

## 2024-06-07 ENCOUNTER — Encounter: Payer: Self-pay | Admitting: Family

## 2024-06-07 ENCOUNTER — Other Ambulatory Visit: Payer: Self-pay | Admitting: Family

## 2024-06-07 DIAGNOSIS — F909 Attention-deficit hyperactivity disorder, unspecified type: Secondary | ICD-10-CM

## 2024-06-07 NOTE — Telephone Encounter (Signed)
 Requesting: Vyvanse  Contract: No UDS: no Last Visit: 02/20/2024 Next Visit: 06/22/2024 Last Refill: Notes to Pharmacy: DNF 05/15/24  E-Prescribing Status: Receipt confirmed by pharmacy (03/15/2024  1:26 PM EDT)     Please Advise

## 2024-06-22 ENCOUNTER — Ambulatory Visit (INDEPENDENT_AMBULATORY_CARE_PROVIDER_SITE_OTHER): Admitting: Family

## 2024-06-22 VITALS — BP 130/76 | HR 82 | Temp 97.7°F | Ht 61.0 in | Wt 230.2 lb

## 2024-06-22 DIAGNOSIS — Z131 Encounter for screening for diabetes mellitus: Secondary | ICD-10-CM

## 2024-06-22 DIAGNOSIS — F909 Attention-deficit hyperactivity disorder, unspecified type: Secondary | ICD-10-CM

## 2024-06-22 DIAGNOSIS — M25512 Pain in left shoulder: Secondary | ICD-10-CM | POA: Insufficient documentation

## 2024-06-22 DIAGNOSIS — Z0001 Encounter for general adult medical examination with abnormal findings: Secondary | ICD-10-CM

## 2024-06-22 DIAGNOSIS — G4733 Obstructive sleep apnea (adult) (pediatric): Secondary | ICD-10-CM

## 2024-06-22 DIAGNOSIS — Z0000 Encounter for general adult medical examination without abnormal findings: Secondary | ICD-10-CM

## 2024-06-22 DIAGNOSIS — Z13220 Encounter for screening for lipoid disorders: Secondary | ICD-10-CM

## 2024-06-22 DIAGNOSIS — G8929 Other chronic pain: Secondary | ICD-10-CM | POA: Diagnosis not present

## 2024-06-22 LAB — COMPREHENSIVE METABOLIC PANEL WITH GFR
ALT: 9 U/L (ref 0–35)
AST: 10 U/L (ref 0–37)
Albumin: 4.3 g/dL (ref 3.5–5.2)
Alkaline Phosphatase: 85 U/L (ref 39–117)
BUN: 10 mg/dL (ref 6–23)
CO2: 28 meq/L (ref 19–32)
Calcium: 9.3 mg/dL (ref 8.4–10.5)
Chloride: 106 meq/L (ref 96–112)
Creatinine, Ser: 0.82 mg/dL (ref 0.40–1.20)
GFR: 90.99 mL/min (ref >60.00)
Glucose, Bld: 85 mg/dL (ref 70–99)
Potassium: 4.1 meq/L (ref 3.5–5.1)
Sodium: 140 meq/L (ref 135–145)
Total Bilirubin: 0.3 mg/dL (ref 0.2–1.2)
Total Protein: 6.9 g/dL (ref 6.0–8.3)

## 2024-06-22 LAB — LIPID PANEL
Cholesterol: 169 mg/dL (ref 0–200)
HDL: 35.8 mg/dL — ABNORMAL LOW (ref >39.00)
LDL Cholesterol: 108 mg/dL — ABNORMAL HIGH (ref 0–99)
NonHDL: 133.4
Total CHOL/HDL Ratio: 5
Triglycerides: 128 mg/dL (ref 0.0–149.0)
VLDL: 25.6 mg/dL (ref 0.0–40.0)

## 2024-06-22 LAB — HEMOGLOBIN A1C: Hgb A1c MFr Bld: 5.2 % (ref 4.6–6.5)

## 2024-06-22 LAB — CBC WITH DIFFERENTIAL/PLATELET
Basophils Absolute: 0 K/uL (ref 0.0–0.1)
Basophils Relative: 0.4 % (ref 0.0–3.0)
Eosinophils Absolute: 0.3 K/uL (ref 0.0–0.7)
Eosinophils Relative: 3.8 % (ref 0.0–5.0)
HCT: 41.1 % (ref 36.0–46.0)
Hemoglobin: 13.5 g/dL (ref 12.0–15.0)
Lymphocytes Relative: 29.3 % (ref 12.0–46.0)
Lymphs Abs: 2.5 K/uL (ref 0.7–4.0)
MCHC: 32.8 g/dL (ref 30.0–36.0)
MCV: 82.8 fl (ref 78.0–100.0)
Monocytes Absolute: 0.4 K/uL (ref 0.1–1.0)
Monocytes Relative: 4.6 % (ref 3.0–12.0)
Neutro Abs: 5.2 K/uL (ref 1.4–7.7)
Neutrophils Relative %: 61.9 % (ref 43.0–77.0)
Platelets: 233 K/uL (ref 150.0–400.0)
RBC: 4.97 Mil/uL (ref 3.87–5.11)
RDW: 14.1 % (ref 11.5–15.5)
WBC: 8.4 K/uL (ref 4.0–10.5)

## 2024-06-22 LAB — TSH: TSH: 0.87 u[IU]/mL (ref 0.35–5.50)

## 2024-06-22 LAB — VITAMIN D 25 HYDROXY (VIT D DEFICIENCY, FRACTURES): VITD: 22.54 ng/mL — ABNORMAL LOW (ref 30.00–100.00)

## 2024-06-22 MED ORDER — ZEPBOUND 5 MG/0.5ML ~~LOC~~ SOAJ
5.0000 mg | SUBCUTANEOUS | 3 refills | Status: DC
Start: 2024-06-22 — End: 2024-08-28

## 2024-06-22 MED ORDER — CYCLOBENZAPRINE HCL 5 MG PO TABS
5.0000 mg | ORAL_TABLET | Freq: Every day | ORAL | 2 refills | Status: AC
Start: 2024-06-22 — End: ?

## 2024-06-22 MED ORDER — LISDEXAMFETAMINE DIMESYLATE 30 MG PO CAPS: 30.0000 mg | ORAL_CAPSULE | Freq: Every day | ORAL | 0 refills | Status: DC

## 2024-06-22 NOTE — Assessment & Plan Note (Signed)
 Deferred clinical breast exam and pelvic exam in the absence of complaints.  Patient also prefers to reestablish care with GYN.  Referral has been placed.  Discussed baseline mammogram;  she politely declines at this time.  Congratulated patient on diligence to exercise

## 2024-06-22 NOTE — Assessment & Plan Note (Signed)
 Presentation c/w muscle spasm. Good ROM of left shoulder. No GI symptoms. Question if scoliosis aggravated.  Referral to PT. Trial of flexeril  at bedtime.

## 2024-06-22 NOTE — Progress Notes (Signed)
 Assessment & Plan:  Annual physical exam Assessment & Plan: Deferred clinical breast exam and pelvic exam in the absence of complaints.  Patient also prefers to reestablish care with GYN.  Referral has been placed.  Discussed baseline mammogram;  she politely declines at this time.  Congratulated patient on diligence to exercise  Orders: -     VITAMIN D  25 Hydroxy (Vit-D Deficiency, Fractures) -     Hemoglobin A1c -     CBC with Differential/Platelet -     Comprehensive metabolic panel with GFR -     Lipid panel -     TSH -     Ambulatory referral to Obstetrics / Gynecology  Attention deficit hyperactivity disorder (ADHD), unspecified ADHD type Assessment & Plan: Chronic, stable. Continue vyvanse  30mg  every day. Refills provided.   Orders: -     Lisdexamfetamine Dimesylate ; Take 1 capsule (30 mg total) by mouth daily.  Dispense: 30 capsule; Refill: 0 -     Lisdexamfetamine Dimesylate ; Take 1 capsule (30 mg total) by mouth daily.  Dispense: 30 capsule; Refill: 0 -     Lisdexamfetamine Dimesylate ; Take 1 capsule (30 mg total) by mouth daily.  Dispense: 30 capsule; Refill: 0  Chronic left shoulder pain Assessment & Plan: Presentation c/w muscle spasm. Good ROM of left shoulder. No GI symptoms. Question if scoliosis aggravated.  Referral to PT. Trial of flexeril  at bedtime.   Orders: -     Cyclobenzaprine  HCl; Take 1-2 tablets (5-10 mg total) by mouth at bedtime.  Dispense: 30 tablet; Refill: 2 -     Ambulatory referral to Physical Therapy  OSA (obstructive sleep apnea) Assessment & Plan: Follow pulmonology.  We agreed that reasonable to stop Ozempic and trial Zepbound  for additional benefit of weight loss after she is plateaued in setting of OSA. Counseled on side effects, titration  Orders: -     Zepbound ; Inject 5 mg into the skin once a week.  Dispense: 2 mL; Refill: 3     Return precautions given.   Risks, benefits, and alternatives of the medications and treatment  plan prescribed today were discussed, and patient expressed understanding.   Education regarding symptom management and diagnosis given to patient on AVS either electronically or printed.  Return in about 3 months (around 09/22/2024).  Rollene Northern, FNP  Subjective:    Patient ID: Camie SHAUNNA Fredericks, female    DOB: [birth year], 38 y.o.   MRN: [id number]  CC: YITTEL EMRICH is a 38 y.o. female who presents today for physical exam.    HPI: Complains of left shoulder posterior for years 'on and off' Started several years ago after pregnancy.   She tried massage and pilates with some relief. Improvement with ibuprofen  and tylenol .   Descries as 'sore', burning at times.   No left arm numbness, neck pain, pain with eating.    Migraines improved with cipap.   Diagnosed with scoliosis as a teen.  Low back pain has improved with weight loss.    H/o OSA  Interested in Zepbound .  Weight loss has plateaued on Ozempic.  She has done well on Ozempic.     Colorectal Cancer Screening: no FDR with colon cancer  Breast Cancer Screening: No FDR with breast cancer Cervical Cancer Screening: Following with GYN         Tetanus - UTD Exercise: Gets regular exercise.   Alcohol use:  none Smoking/tobacco use: Nonsmoker.    Health Maintenance  Topic Date Due   Hepatitis C  Screening  Never done   Pneumococcal Vaccination (1 of 2 - PCV) Never done   Hepatitis B Vaccine (1 of 3 - 19+ 3-dose series) Never done   HPV Vaccine (1 - 3-dose SCDM series) Never done   Pap with HPV screening  08/06/2019   COVID-19 Vaccine (3 - 2024-25 season) 08/14/2023   DTaP/Tdap/Td vaccine (1 - Tdap) 06/22/2025*   Flu Shot  07/13/2024   HIV Screening  Completed   Meningitis B Vaccine  Aged Out  *Topic was postponed. The date shown is not the original due date.    ALLERGIES: Tape  Current Outpatient Medications on File Prior to Visit  Medication Sig Dispense Refill   albuterol  (VENTOLIN  HFA) 108 (90 Base) MCG/ACT  inhaler INHALE 1 PUFF INTO THE LUNGS EVERY 4 (FOUR) HOURS AS NEEDED FOR WHEEZING OR SHORTNESS OF BREATH. 18 g 1   Blood Pressure Monitoring (3 SERIES BP MONITOR/UPPER ARM) DEVI Use to check BP once daily. (Patient taking differently: as needed. Use to check BP once daily.) 1 each 0   buPROPion  (WELLBUTRIN  XL) 300 MG 24 hr tablet Take 1 tablet (300 mg total) by mouth daily. 90 tablet 3   cetirizine (ZYRTEC) 10 MG tablet Take 10 mg by mouth daily.     escitalopram  (LEXAPRO ) 20 MG tablet Take 1 tablet (20 mg total) by mouth daily. 90 tablet 3   fluticasone  (FLONASE ) 50 MCG/ACT nasal spray Place 2 sprays into both nostrils daily. (Patient taking differently: Place 2 sprays into both nostrils as needed for rhinitis or allergies.) 16 g 6   loratadine (CLARITIN) 10 MG tablet Take 10 mg by mouth daily as needed.     tretinoin (RETIN-A) 0.025 % cream Apply topically at bedtime.     No current facility-administered medications on file prior to visit.    Review of Systems  Constitutional:  Negative for chills and fever.  Respiratory:  Negative for cough.   Cardiovascular:  Negative for chest pain and palpitations.  Gastrointestinal:  Negative for nausea and vomiting.  Musculoskeletal:  Positive for arthralgias and back pain.  Neurological:  Negative for headaches.      Objective:    BP 130/76   Pulse 82   Temp 97.7 F (36.5 C) (Oral)   Ht 5' 1 (1.549 m)   Wt 230 lb 3.2 oz (104.4 kg)   LMP  (LMP Unknown)   SpO2 97%   BMI 43.50 kg/m   BP Readings from Last 3 Encounters:  06/22/24 130/76  05/25/24 130/80  02/22/24 134/86   Wt Readings from Last 3 Encounters:  06/22/24 230 lb 3.2 oz (104.4 kg)  05/25/24 232 lb 12.8 oz (105.6 kg)  02/22/24 240 lb 12.8 oz (109.2 kg)    Physical Exam Vitals reviewed.  Constitutional:      Appearance: She is well-developed.  Eyes:     Conjunctiva/sclera: Conjunctivae normal.  Neck:     Thyroid : No thyroid  mass or thyromegaly.  Cardiovascular:      Rate and Rhythm: Normal rate and regular rhythm.     Pulses: Normal pulses.     Heart sounds: Normal heart sounds.  Pulmonary:     Effort: Pulmonary effort is normal.     Breath sounds: Normal breath sounds. No wheezing, rhonchi or rales.  Musculoskeletal:     Left shoulder: No swelling or bony tenderness. Normal range of motion.     Cervical back: Tenderness present. No spasms, torticollis or bony tenderness. No pain with movement.  Back:     Comments: Tenderness over trapezius  Lymphadenopathy:     Head:     Right side of head: No submental, submandibular, tonsillar, preauricular, posterior auricular or occipital adenopathy.     Left side of head: No submental, submandibular, tonsillar, preauricular, posterior auricular or occipital adenopathy.     Cervical: No cervical adenopathy.  Skin:    General: Skin is warm and dry.  Neurological:     Mental Status: She is alert.  Psychiatric:        Speech: Speech normal.        Behavior: Behavior normal.        Thought Content: Thought content normal.

## 2024-06-22 NOTE — Assessment & Plan Note (Signed)
 Follow pulmonology.  We agreed that reasonable to stop Ozempic and trial Zepbound  for additional benefit of weight loss after she is plateaued in setting of OSA. Counseled on side effects, titration

## 2024-06-22 NOTE — Assessment & Plan Note (Signed)
 Chronic, stable. Continue vyvanse  30mg  every day. Refills provided.

## 2024-06-22 NOTE — Patient Instructions (Signed)
 Start flexeril   Do not drive or operate heavy machinery while on muscle relaxant. Please do not drink alcohol. Only take this medication as needed for acute muscle spasm at bedtime. This medication make you feel drowsy so be very careful.  Stop taking if become too drowsy or somnolent as this puts you at risk for falls. Please contact our office with any questions.   Referral to physical therapy  Let us  know if you dont hear back within a week in regards to an appointment being scheduled.   So that you are aware, if you are [facility] MyChart user , please pay attention to your MyChart messages as you may receive a MyChart message with a phone number to call and schedule this test/appointment own your own from our referral coordinator. This is a new process so I do not want you to miss this message.  If you are not a MyChart user, you will receive a phone call.   Stop ozempic. Trial of zepbound   start Zepbound  2.5mg  once per week injected subcutaneously ( [place])  in stomach. Please clean with alcohol swab prior to injection and be sure to rotate site. You may schedule a nurse visit if you would like to first injection.   After 4 weeks, and if tolerated and weight loss has not reached 1-2 lbs per week, please increase to 5mg  once per week [place].  Once you are actively losing weight, you do not need to further increase medication.  Dose increments are below.   7.5 mg/0.5 mL (0.5 mL) 10 mg/0.5 mL (0.5 mL) 12.5 mg/0.5 mL (0.5 mL) 15 mg/0.5 mL (0.5 mL)   Please read information on medication below and remember black box warning that you may not take if you or a family member is diagnosed with thyroid  cancer (medullary thyroid  cancer), or multiple endocrine neoplasia.       Brand Names: [place]  Mounjaro; Zepbound  Brand Names: [place] Mounjaro  Warning  This drug has been shown to cause thyroid  cancer in some animals. It is not known if this happens in humans. If thyroid  cancer happens, it may be deadly if not  found and treated early. Call your doctor right away if you have a neck mass, trouble breathing, trouble swallowing, or have hoarseness that will not go away.  Do not use this drug if you have a health problem called Multiple Endocrine Neoplasia syndrome type 2 (MEN 2), or if you or a family member have had thyroid  cancer.  Have your blood work checked and thyroid  ultrasounds as you have been told by your doctor. What is this drug used for?  It is used to lower blood sugar in people with type 2 diabetes.  It is used to help with weight loss in certain people. What do I need to tell my doctor BEFORE I take this drug? All products:  If you are allergic to this drug; any part of this drug; or any other drugs, foods, or substances. Tell your doctor about the allergy and what signs you had.  If you have ever had pancreatitis.  If you have stomach or bowel problems.  If you are using another drug that has the same drug in it.  If you are using another drug like this one. If you are not sure, ask your doctor or pharmacist. If you are using this drug for diabetes:  If you have type 1 diabetes. Do not use this drug to treat type 1 diabetes. Zepbound :  If you have or  have ever had depression or thoughts of suicide. This is not a list of all drugs or health problems that interact with this drug. Tell your doctor and pharmacist about all of your drugs (prescription or OTC, natural products, vitamins) and health problems. You must check to make sure that it is safe for you to take this drug with all of your drugs and health problems. Do not start, stop, or change the dose of any drug without checking with your doctor. What are some things I need to know or do while I take this drug? All products:  Tell all of your health care providers that you take this drug. This includes your doctors, nurses, pharmacists, and dentists.  Follow the diet and workout plan that your doctor told you about.  Talk with your  doctor before you drink alcohol.  Birth control pills may not work as well to prevent pregnancy. If you take birth control pills, you may need to switch to another type of hormone-based birth control like a vaginal ring if your doctor tells you to. If another type of hormone-based birth control is not an option, use some other kind of birth control also, like a condom. Do this for 4 weeks after starting this drug and for 4 weeks each time the dose is raised.  This drug may prevent other drugs taken by mouth from getting into the body. If you take other drugs by mouth, you may need to take them at some other time than this drug. Talk with your doctor.  Do not share with another person even if the needle has been changed. Sharing your tray or pen may pass infections from one person to another. This includes infections you may not know you have.  If you cannot drink liquids by mouth or if you have upset stomach, throwing up, or diarrhea that does not go away; you need to avoid getting dehydrated. Contact your doctor to find out what to do. Dehydration may lead to low blood pressure or to new or worse kidney problems.  A severe and sometimes deadly pancreas problem (pancreatitis) has happened with other drugs like this one. If you are using this drug for diabetes:  It may be harder to control blood sugar during times of stress such as fever, infection, injury, or surgery. A change in physical activity, exercise, or diet may also affect blood sugar.  Check your blood sugar as you have been told by your doctor.  Do not drive if your blood sugar has been low. There is a greater chance of you having a crash.  Wear disease medical alert ID (identification).  Tell your doctor if you are pregnant, plan on getting pregnant, or are breast-feeding. You will need to talk about the benefits and risks to you and the baby. Zepbound :  If you have high blood sugar (diabetes), you will need to watch your blood sugar  closely.  Weight loss during pregnancy may cause harm to the unborn baby. If you get pregnant while taking this drug or if you want to get pregnant, call your doctor right away.  Tell your doctor if you are breast-feeding. You will need to talk about any risks to your baby. What are some side effects that I need to call my doctor about right away? WARNING/CAUTION: Even though it may be rare, some people may have very bad and sometimes deadly side effects when taking a drug. Tell your doctor or get medical help right away if you have  any of the following signs or symptoms that may be related to a very bad side effect: All products:  Signs of an allergic reaction, like rash; hives; itching; red, swollen, blistered, or peeling skin with or without fever; wheezing; tightness in the chest or throat; trouble breathing, swallowing, or talking; unusual hoarseness; or swelling of the mouth, face, lips, tongue, or throat.  Signs of kidney problems like unable to pass urine, change in how much urine is passed, blood in the urine, or a big weight gain.  Signs of gallbladder problems like pain in the upper right belly area, right shoulder area, or between the shoulder blades; yellow skin or eyes; fever with chills; bloating; or very upset stomach or throwing up.  Signs of a pancreas problem (pancreatitis) like very bad stomach pain, very bad back pain, or very bad upset stomach or throwing up.  Dizziness or passing out.  A fast heartbeat.  Change in eyesight.  Low blood sugar can happen. The chance may be raised when this drug is used with other drugs for diabetes. Signs may be dizziness, headache, feeling sleepy or weak, shaking, fast heartbeat, confusion, hunger, or sweating. Call your doctor right away if you have any of these signs. Follow what you have been told to do for low blood sugar. This may include taking glucose tablets, liquid glucose, or some fruit juices. Zepbound :  New or worse behavior or mood  changes like depression or thoughts of suicide. What are some other side effects of this drug? All drugs may cause side effects. However, many people have no side effects or only have minor side effects. Call your doctor or get medical help if any of these side effects or any other side effects bother you or do not go away: All products:  Constipation, diarrhea, stomach pain, upset stomach, throwing up, or decreased appetite.  Heartburn.  Pain, itching, or other irritation where the injection was given. Zepbound :  Feeling tired or weak. These are not all of the side effects that may occur. If you have questions about side effects, call your doctor. Call your doctor for medical advice about side effects. You may report side effects to your national health agency. How is this drug best taken? Use this drug as ordered by your doctor. Read all information given to you. Follow all instructions closely. All products:  It is given as a shot into the fatty part of the skin on the top of the thigh, belly area, or upper arm.  If you will be giving yourself the shot, your doctor or nurse will teach you how to give the shot.  Keep taking this drug as you have been told by your doctor or other health care provider, even if you feel well.  Take the same day each week.  Move site where you give the shot each time.  Take with or without food.  Wash your hands before and after use.  Do not use if the solution is leaking or has particles.  This drug is colorless to a faint yellow. Do not use if the solution changes color.  Do not move this drug from the pen to a syringe.  Each pen or vial is for 1 use only. Throw away any part of the used pen after the dose is given.  Throw away needles in a needle/sharp disposal box. Do not reuse needles or other items. When the box is full, follow all local rules for getting rid of it. Talk with a  doctor or pharmacist if you have any questions. If you are using this drug  for diabetes:  If you are also using insulin, you may inject this drug and the insulin in the same area of the body but not right next to each other.  Do not mix this drug in the same syringe with insulin. What do I do if I miss a dose?  If it is within 4 days after the missed dose, take the missed dose and go back to your normal day.  If it has been more than 4 days since the missed dose, skip the missed dose and go back to your normal day.  Do not take 2 doses at the same time or extra doses. How do I store and/or throw out this drug?  Store in a refrigerator. Do not freeze.  Do not use if it has been frozen.  If needed, each pen or vial may be stored at room temperature for up to 21 days. If you store at room temperature, throw away any part not used after 21 days.  Protect from heat.  Store in the original container to protect from light.  Keep all drugs in a safe place. Keep all drugs out of the reach of children and pets.  Throw away unused or expired drugs. Do not flush down a toilet or pour down a drain unless you are told to do so. Check with your pharmacist if you have questions about the best way to throw out drugs. There may be drug take-back programs in your area. General drug facts  If your symptoms or health problems do not get better or if they become worse, call your doctor.  Do not share your drugs with others and do not take anyone else's drugs.  Some drugs may have another patient information leaflet. If you have any questions about this drug, please talk with your doctor, nurse, pharmacist, or other health care provider.  If you think there has been an overdose, call your poison control center or get medical care right away. Be ready to tell or show what was taken, how much, and when it happened. Last Reviewed Date 2022-10-29 Consumer Information Use and Disclaimer This generalized information is a limited summary of diagnosis, treatment, and/or medication information. It  is not meant to be comprehensive and should be used as a tool to help the user understand and/or assess potential diagnostic and treatment options. It does NOT include all information about conditions, treatments, medications, side effects, or risks that may apply to a specific patient. It is not intended to be medical advice or a substitute for the medical advice, diagnosis, or treatment of a health care provider based on the health care provider's examination and assessment of a patient's specific and unique circumstances. Patients must speak with a health care provider for complete information about their health, medical questions, and treatment options, including any risks or benefits regarding use of medications. This information does not endorse any treatments or medications as safe, effective, or approved for treating a specific patient. UpToDate, Inc. and its affiliates disclaim any warranty or liability relating to this information or the use thereof. The use of this information is governed by the Terms of Use, available at https://www.wolterskluwer.com/en/know/clinical-effectiveness-terms.  2023 UpToDate, Inc. and its affiliates and/or licensors. All rights reserved. Use of UpToDate is subject to the Terms of Use. Topic 861265 Version 16.0 Health Maintenance, Female Adopting a healthy lifestyle and getting preventive care are important in promoting health  and wellness. Ask your health care provider about: The right schedule for you to have regular tests and exams. Things you can do on your own to prevent diseases and keep yourself healthy. What should I know about diet, weight, and exercise? Eat a healthy diet  Eat a diet that includes plenty of vegetables, fruits, low-fat dairy products, and lean protein. Do not eat a lot of foods that are high in solid fats, added sugars, or sodium. Maintain a healthy weight Body mass index (BMI) is used to identify weight problems. It estimates body fat  based on height and weight. Your health care provider can help determine your BMI and help you achieve or maintain a healthy weight. Get regular exercise Get regular exercise. This is one of the most important things you can do for your health. Most adults should: Exercise for at least 150 minutes each week. The exercise should increase your heart rate and make you sweat (moderate-intensity exercise). Do strengthening exercises at least twice a week. This is in addition to the moderate-intensity exercise. Spend less time sitting. Even light physical activity can be beneficial. Watch cholesterol and blood lipids Have your blood tested for lipids and cholesterol at 38 years of age, then have this test every 5 years. Have your cholesterol levels checked more often if: Your lipid or cholesterol levels are high. You are older than 38 years of age. You are at high risk for heart disease. What should I know about cancer screening? Depending on your health history and family history, you may need to have cancer screening at various ages. This may include screening for: Breast cancer. Cervical cancer. Colorectal cancer. Skin cancer. Lung cancer. What should I know about heart disease, diabetes, and high blood pressure? Blood pressure and heart disease High blood pressure causes heart disease and increases the risk of stroke. This is more likely to develop in people who have high blood pressure readings or are overweight. Have your blood pressure checked: Every 3-5 years if you are 18-44 years of age. Every year if you are 50 years old or older. Diabetes Have regular diabetes screenings. This checks your fasting blood sugar level. Have the screening done: Once every three years after age 9 if you are at a normal weight and have a low risk for diabetes. More often and at a younger age if you are overweight or have a high risk for diabetes. What should I know about preventing infection? Hepatitis  B If you have a higher risk for hepatitis B, you should be screened for this virus. Talk with your health care provider to find out if you are at risk for hepatitis B infection. Hepatitis C Testing is recommended for: Everyone born from [birth year] through 1965. Anyone with known risk factors for hepatitis C. Sexually transmitted infections (STIs) Get screened for STIs, including gonorrhea and chlamydia, if: You are sexually active and are younger than 38 years of age. You are older than 38 years of age and your health care provider tells you that you are at risk for this type of infection. Your sexual activity has changed since you were last screened, and you are at increased risk for chlamydia or gonorrhea. Ask your health care provider if you are at risk. Ask your health care provider about whether you are at high risk for HIV. Your health care provider may recommend a prescription medicine to help prevent HIV infection. If you choose to take medicine to prevent HIV, you should first get tested  for HIV. You should then be tested every 3 months for as long as you are taking the medicine. Pregnancy If you are about to stop having your period (premenopausal) and you may become pregnant, seek counseling before you get pregnant. Take 400 to 800 micrograms (mcg) of folic acid every day if you become pregnant. Ask for birth control (contraception) if you want to prevent pregnancy. Osteoporosis and menopause Osteoporosis is a disease in which the bones lose minerals and strength with aging. This can result in bone fractures. If you are 45 years old or older, or if you are at risk for osteoporosis and fractures, ask your health care provider if you should: Be screened for bone loss. Take a calcium or vitamin D  supplement to lower your risk of fractures. Be given hormone replacement therapy (HRT) to treat symptoms of menopause. Follow these instructions at home: Alcohol use Do not drink alcohol if: Your  health care provider tells you not to drink. You are pregnant, may be pregnant, or are planning to become pregnant. If you drink alcohol: Limit how much you have to: 0-1 drink a day. Know how much alcohol is in your drink. In the [place] one drink equals one 12 oz bottle of beer (355 mL), one 5 oz glass of wine (148 mL), or one 1 oz glass of hard liquor (44 mL). Lifestyle Do not use any products that contain nicotine or tobacco. These products include cigarettes, chewing tobacco, and vaping devices, such as e-cigarettes. If you need help quitting, ask your health care provider. Do not use street drugs. Do not share needles. Ask your health care provider for help if you need support or information about quitting drugs. General instructions Schedule regular health, dental, and eye exams. Stay current with your vaccines. Tell your health care provider if: You often feel depressed. You have ever been abused or do not feel safe at home. Summary Adopting a healthy lifestyle and getting preventive care are important in promoting health and wellness. Follow your health care provider's instructions about healthy diet, exercising, and getting tested or screened for diseases. Follow your health care provider's instructions on monitoring your cholesterol and blood pressure. This information is not intended to replace advice given to you by your health care provider. Make sure you discuss any questions you have with your health care provider. Document Revised: 04/20/2021 Document Reviewed: 04/20/2021 Elsevier Patient Education  2024 [employer].

## 2024-06-24 ENCOUNTER — Other Ambulatory Visit: Payer: Self-pay | Admitting: Medical Genetics

## 2024-06-26 ENCOUNTER — Other Ambulatory Visit (HOSPITAL_COMMUNITY): Payer: Self-pay

## 2024-06-26 ENCOUNTER — Encounter: Payer: Self-pay | Admitting: Family

## 2024-06-26 ENCOUNTER — Telehealth: Payer: Self-pay

## 2024-06-26 ENCOUNTER — Ambulatory Visit: Payer: Self-pay | Admitting: Family

## 2024-06-26 NOTE — Telephone Encounter (Signed)
 Pharmacy Patient Advocate Encounter   Received notification from CoverMyMeds that prior authorization for Zepbound  5 is required/requested.   Insurance verification completed.   The patient is insured through [facility] .   Per test claim: PA required; PA submitted to above mentioned insurance via CoverMyMeds Key/confirmation #/EOC [id number] Status is pending

## 2024-06-26 NOTE — Telephone Encounter (Signed)
 Pharmacy Patient Advocate Encounter  Received notification from [employer] that Prior Authorization for Zepbound  5MG /0.5ML pen-injectors has been APPROVED from 06/26/24 to 12/27/24. Ran test claim, Copay is $24.99. This test claim was processed through [facility]- copay amounts may vary at other pharmacies due to pharmacy/plan contracts, or as the patient moves through the different stages of their insurance plan.   PA #/Case ID/Reference #: [id number]

## 2024-06-26 NOTE — Telephone Encounter (Signed)
Pt has been sent mychart message.

## 2024-06-27 NOTE — Telephone Encounter (Signed)
 Pt is aware.

## 2024-07-21 ENCOUNTER — Other Ambulatory Visit: Payer: Self-pay | Attending: Medical Genetics

## 2024-08-28 ENCOUNTER — Encounter: Payer: Self-pay | Admitting: Family

## 2024-08-28 DIAGNOSIS — E66813 Obesity, class 3: Secondary | ICD-10-CM

## 2024-08-28 MED ORDER — ZEPBOUND 7.5 MG/0.5ML ~~LOC~~ SOAJ: 7.5000 mg | SUBCUTANEOUS | 2 refills | Status: AC

## 2024-08-28 NOTE — Telephone Encounter (Signed)
 Medication pended for approval.

## 2024-08-31 ENCOUNTER — Encounter: Admitting: Family

## 2024-09-06 ENCOUNTER — Other Ambulatory Visit (HOSPITAL_COMMUNITY)
Admission: RE | Admit: 2024-09-06 | Discharge: 2024-09-06 | Disposition: A | Source: Ambulatory Visit | Attending: Obstetrics and Gynecology | Admitting: Obstetrics and Gynecology

## 2024-09-06 ENCOUNTER — Encounter: Payer: Self-pay | Admitting: Obstetrics and Gynecology

## 2024-09-06 ENCOUNTER — Ambulatory Visit: Admitting: Physical Therapy

## 2024-09-06 ENCOUNTER — Ambulatory Visit: Admitting: Obstetrics and Gynecology

## 2024-09-06 VITALS — BP 130/85 | HR 87 | Ht 61.5 in | Wt 237.0 lb

## 2024-09-06 DIAGNOSIS — Z124 Encounter for screening for malignant neoplasm of cervix: Secondary | ICD-10-CM | POA: Diagnosis present

## 2024-09-06 DIAGNOSIS — N393 Stress incontinence (female) (male): Secondary | ICD-10-CM | POA: Diagnosis not present

## 2024-09-06 DIAGNOSIS — Z1151 Encounter for screening for human papillomavirus (HPV): Secondary | ICD-10-CM | POA: Insufficient documentation

## 2024-09-06 DIAGNOSIS — Z01419 Encounter for gynecological examination (general) (routine) without abnormal findings: Secondary | ICD-10-CM | POA: Diagnosis not present

## 2024-09-06 NOTE — Patient Instructions (Signed)
 I value your feedback and you entrusting [name] with your care. If you get a [place] patient survey, I would appreciate you taking the time to let us know about your experience today. Thank you! ? ? ?

## 2024-09-06 NOTE — Progress Notes (Signed)
 PCP:  Dineen Rollene MATSU, FNP   Chief Complaint  Patient presents with   Gynecologic Exam    No concerns     HPI:      [name] is a 38 y.o. [phone] whose LMP was Patient's last menstrual period was 08/12/2024 (approximate)., presents today for her NP annual examination.  Her menses are regular every 28-30 days, lasting 3-4 days, mod flow, no BTB, mild to mod dysmen, not relieved with NSAIDs.   Sex activity: single partner, contraception - tubal ligation. No pain/bleeding/dryness. Does have some decreased libido. On lexapro . Last Pap: not recent; no hx of abn paps  There is no FH of breast cancer. There is no FH of ovarian cancer. The patient does do self-breast exams.  Tobacco use: The patient denies current or previous tobacco use. Alcohol use: none No drug use.  Exercise: moderately active  She does get adequate calcium and some Vitamin D  in her diet. Hx of Vit D deficiency in past.  Labs with PCP; has lost 60# with Rx and diet/exercise changes. Has some issues with SUI.   Patient Active Problem List   Diagnosis Date Noted   Left shoulder pain 06/22/2024   Elevated BP without diagnosis of hypertension 11/19/2022   Otitis media 03/23/2022   ADHD 01/11/2022   Insulin resistance 09/25/2020   OSA (obstructive sleep apnea) 09/25/2020   Vitamin D  deficiency 06/16/2018   Annual physical exam 02/02/2018   Class 3 severe obesity with serious comorbidity and body mass index (BMI) of 45.0 to 49.9 in adult 02/02/2018   Migraine 08/19/2017   Anxiety and depression 04/01/2016    Past Surgical History:  Procedure Laterality Date   CESAREAN SECTION N/A 09/27/2014   Procedure: Primary CESAREAN SECTION;  Surgeon: Dickie DELENA Carder, MD;  Location: [facility];  Service: Obstetrics;  Laterality: N/A;  EDD: 10/04/14   CESAREAN SECTION WITH BILATERAL TUBAL LIGATION N/A 02/25/2017   Procedure: REPEAT CESAREAN SECTION WITH BILATERAL TUBAL LIGATION;  Surgeon: Dickie Carder, MD;   Location: [facility];  Service: Obstetrics;  Laterality: N/A;  EDD: 02/28/17   WISDOM TOOTH EXTRACTION      Family History  Problem Relation Age of Onset   Depression Mother    Anxiety disorder Mother    Fibrocystic breast disease Mother        multiple breast biopsy   Diabetes Father    Alcoholism Father    Alcohol abuse Father    Obesity Father    Colon cancer Maternal Grandmother 84   Diabetes Maternal Grandmother    Dementia Maternal Grandmother 44   Cancer - Colon Maternal Grandmother        50/60s   Stroke Paternal Grandmother 68   Diabetes Paternal Grandmother    Alcoholism Other        Parent   Thyroid  cancer Neg Hx     Social History   Socioeconomic History   Marital status: Married    Spouse name: Not on file   Number of children: Not on file   Years of education: Not on file   Highest education level: Master's degree (e.g., MA, MS, MEng, MEd, MSW, MBA)  Occupational History   Occupation: Visual merchandiser  Tobacco Use   Smoking status: Never   Smokeless tobacco: Never  Vaping Use   Vaping status: Never Used  Substance and Sexual Activity   Alcohol use: No   Drug use: No   Sexual activity: Yes    Birth control/protection:  Surgical    Comment: TL  Other Topics Concern   Not on file  Social History Narrative   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[employer], works from home      Grew up [employer]       Married      3 girls   12,9, 6      Enjoys Clinical cytogeneticist- Editor, commissioning, crocheting               Social Drivers of Health   Financial Resource Strain: Low Risk  (06/22/2024)   Overall Financial Resource Strain (CARDIA)    Difficulty of Paying Living Expenses: Not hard at all  Food Insecurity: No Food Insecurity (06/22/2024)   Hunger Vital Sign    Worried About Running Out of Food in the Last Year: Never true    Ran Out of Food in the Last Year: Never true  Transportation Needs: No Transportation Needs (06/22/2024)   PRAPARE -  Administrator, Civil Service (Medical): No    Lack of Transportation (Non-Medical): No  Physical Activity: Insufficiently Active (06/22/2024)   Exercise Vital Sign    Days of Exercise per Week: 2 days    Minutes of Exercise per Session: 30 min  Stress: Stress Concern Present (06/22/2024)   [employer] of Occupational Health - Occupational Stress Questionnaire    Feeling of Stress: To some extent  Social Connections: Socially Integrated (06/22/2024)   Social Connection and Isolation Panel    Frequency of Communication with Friends and Family: More than three times a week    Frequency of Social Gatherings with Friends and Family: Twice a week    Attends Religious Services: More than 4 times per year    Active Member of [employer] or Organizations: Yes    Attends Engineer, structural: More than 4 times per year    Marital Status: Married  Catering manager Violence: Not on file     Current Outpatient Medications:    albuterol  (VENTOLIN  HFA) 108 (90 Base) MCG/ACT inhaler, INHALE 1 PUFF INTO THE LUNGS EVERY 4 (FOUR) HOURS AS NEEDED FOR WHEEZING OR SHORTNESS OF BREATH., Disp: 18 g, Rfl: 1   buPROPion  (WELLBUTRIN  XL) 300 MG 24 hr tablet, Take 1 tablet (300 mg total) by mouth daily., Disp: 90 tablet, Rfl: 3   cetirizine (ZYRTEC) 10 MG tablet, Take 10 mg by mouth daily., Disp: , Rfl:    cyclobenzaprine  (FLEXERIL ) 5 MG tablet, Take 1-2 tablets (5-10 mg total) by mouth at bedtime., Disp: 30 tablet, Rfl: 2   escitalopram  (LEXAPRO ) 20 MG tablet, Take 1 tablet (20 mg total) by mouth daily., Disp: 90 tablet, Rfl: 3   fluticasone  (FLONASE ) 50 MCG/ACT nasal spray, Place 2 sprays into both nostrils daily., Disp: 16 g, Rfl: 6   lisdexamfetamine (VYVANSE ) 30 MG capsule, Take 1 capsule (30 mg total) by mouth daily., Disp: 30 capsule, Rfl: 0   loratadine (CLARITIN) 10 MG tablet, Take 10 mg by mouth daily as needed., Disp: , Rfl:    tirzepatide  (ZEPBOUND ) 7.5 MG/0.5ML Pen, Inject 7.5 mg  into the skin once a week., Disp: 2 mL, Rfl: 2   Blood Pressure Monitoring (3 SERIES BP MONITOR/UPPER ARM) DEVI, Use to check BP once daily. (Patient not taking: Reported on 09/06/2024), Disp: 1 each, Rfl: 0   lisdexamfetamine (VYVANSE ) 30 MG capsule, Take 1 capsule (30 mg total) by mouth daily., Disp: 30 capsule, Rfl: 0   lisdexamfetamine (VYVANSE ) 30 MG capsule, Take 1 capsule (30 mg total) by  mouth daily., Disp: 30 capsule, Rfl: 0     ROS:  Review of Systems  Constitutional:  Negative for fatigue, fever and unexpected weight change.  Respiratory:  Negative for cough, shortness of breath and wheezing.   Cardiovascular:  Negative for chest pain, palpitations and leg swelling.  Gastrointestinal:  Negative for blood in stool, constipation, diarrhea, nausea and vomiting.  Endocrine: Negative for cold intolerance, heat intolerance and polyuria.  Genitourinary:  Negative for dyspareunia, dysuria, flank pain, frequency, genital sores, hematuria, menstrual problem, pelvic pain, urgency, vaginal bleeding, vaginal discharge and vaginal pain.  Musculoskeletal:  Negative for back pain, joint swelling and myalgias.  Skin:  Negative for rash.  Neurological:  Negative for dizziness, syncope, light-headedness, numbness and headaches.  Hematological:  Negative for adenopathy.  Psychiatric/Behavioral:  Positive for agitation and dysphoric mood. Negative for confusion, sleep disturbance and suicidal ideas. The patient is not nervous/anxious.    BREAST: No symptoms   Objective: BP 130/85   Pulse 87   Ht 5' 1.5 (1.562 m)   Wt 237 lb (107.5 kg)   LMP 08/12/2024 (Approximate)   BMI 44.06 kg/m    Physical Exam Constitutional:      Appearance: She is well-developed.  Genitourinary:     Vulva normal.     Right Labia: No rash, tenderness or lesions.    Left Labia: No tenderness, lesions or rash.    No vaginal discharge, erythema or tenderness.      Right Adnexa: not tender and no mass present.     Left Adnexa: not tender and no mass present.    No cervical friability or polyp.     Uterus is not enlarged or tender.  Breasts:    Right: No mass, nipple discharge, skin change or tenderness.     Left: No mass, nipple discharge, skin change or tenderness.  Neck:     Thyroid : No thyromegaly.  Cardiovascular:     Rate and Rhythm: Normal rate and regular rhythm.     Heart sounds: Normal heart sounds. No murmur heard. Pulmonary:     Effort: Pulmonary effort is normal.     Breath sounds: Normal breath sounds.  Abdominal:     Palpations: Abdomen is soft.     Tenderness: There is no abdominal tenderness. There is no guarding or rebound.  Musculoskeletal:        General: Normal range of motion.     Cervical back: Normal range of motion.  Lymphadenopathy:     Cervical: No cervical adenopathy.  Neurological:     General: No focal deficit present.     Mental Status: She is alert and oriented to person, place, and time.     Cranial Nerves: No cranial nerve deficit.  Skin:    General: Skin is warm and dry.  Psychiatric:        Mood and Affect: Mood normal.        Behavior: Behavior normal.        Thought Content: Thought content normal.        Judgment: Judgment normal.  Vitals reviewed.     Assessment/Plan: Encounter for annual routine gynecological examination  Cervical cancer screening - Plan: Cytology - PAP  Screening for HPV (human papillomavirus) - Plan: Cytology - PAP  SUI (stress urinary incontinence, female)--add kegels/pelvic support exercises; wt loss is helpful too.             GYN counsel mammography screening, adequate intake of calcium and vitamin D , diet and exercise  F/U  Return in about 1 year (around 09/06/2025).  Tamme Mozingo B. Maripaz Mullan, PA-C 09/06/2024 3:51 PM

## 2024-09-09 NOTE — Patient Instructions (Signed)
 Kim Garcia

## 2024-09-10 ENCOUNTER — Ambulatory Visit: Attending: Family | Admitting: Physical Therapy

## 2024-09-10 DIAGNOSIS — G8929 Other chronic pain: Secondary | ICD-10-CM | POA: Insufficient documentation

## 2024-09-10 DIAGNOSIS — M25512 Pain in left shoulder: Secondary | ICD-10-CM | POA: Insufficient documentation

## 2024-09-10 DIAGNOSIS — M25511 Pain in right shoulder: Secondary | ICD-10-CM | POA: Insufficient documentation

## 2024-09-10 DIAGNOSIS — M898X1 Other specified disorders of bone, shoulder: Secondary | ICD-10-CM | POA: Diagnosis present

## 2024-09-10 LAB — CYTOLOGY - PAP
Adequacy: ABSENT
Comment: NEGATIVE
Diagnosis: NEGATIVE
High risk HPV: NEGATIVE

## 2024-09-10 NOTE — Therapy (Unsigned)
 OUTPATIENT PHYSICAL THERAPY SHOULDER EVALUATION   Patient Name: Kim Garcia MRN: [id number] DOB:[birth year], 38 y.o., female Today's Date: 09/09/2024  END OF SESSION:  PT End of Session - 09/10/24 1754     Visit Number 1    Number of Visits 24    Date for Recertification  12/03/24    Authorization Type UMR [id number]    Authorization - Visit Number 1    Progress Note Due on Visit 10    PT Start Time 1700    PT Stop Time 1745    PT Time Calculation (min) 45 min    Activity Tolerance Patient tolerated treatment well    Behavior During Therapy [facility] for tasks assessed/performed           Past Medical History:  Diagnosis Date   Anemia    Anxiety    Asthma    as child, rarely uses inhaler   Back pain    Bilateral swelling of feet    Depression    Depression    History of blood transfusion 05/2011   1 unit transfused [facility]   Joint pain    Migraines    Missed ab 2014   no surgery required   Obesity    Seasonal allergies    Sleep apnea    Sleep apnea    SVD (spontaneous vaginal delivery) 05/2011   x 1   Vitamin D  deficiency    Past Surgical History:  Procedure Laterality Date   CESAREAN SECTION N/A 09/27/2014   Procedure: Primary CESAREAN SECTION;  Surgeon: Dickie DELENA Carder, MD;  Location: [facility];  Service: Obstetrics;  Laterality: N/A;  EDD: 10/04/14   CESAREAN SECTION WITH BILATERAL TUBAL LIGATION N/A 02/25/2017   Procedure: REPEAT CESAREAN SECTION WITH BILATERAL TUBAL LIGATION;  Surgeon: Dickie Carder, MD;  Location: [facility];  Service: Obstetrics;  Laterality: N/A;  EDD: 02/28/17   WISDOM TOOTH EXTRACTION     Patient Active Problem List   Diagnosis Date Noted   Left shoulder pain 06/22/2024   Elevated BP without diagnosis of hypertension 11/19/2022   Otitis media 03/23/2022   ADHD 01/11/2022   Insulin resistance 09/25/2020   OSA (obstructive sleep apnea) 09/25/2020   Vitamin D  deficiency 06/16/2018   Annual physical exam 02/02/2018   Class 3  severe obesity with serious comorbidity and body mass index (BMI) of 45.0 to 49.9 in adult 02/02/2018   Migraine 08/19/2017   Anxiety and depression 04/01/2016    PCP: Dr. Rollene Northern     REFERRING PROVIDER: Dr. Rollene Northern     REFERRING DIAG: [phone] (ICD-10-CM) - Chronic left shoulder pain  THERAPY DIAG:  No diagnosis found.  Rationale for Evaluation and Treatment: Rehabilitation  ONSET DATE: >=3 years ago.    SUBJECTIVE:  SUBJECTIVE STATEMENT: Pt reports that she is feeling increased pain below her shoulder blades and she tends to feel this pain more after a long day of work or lifting a car seat. This has been going on ever since she had her youngest child and it even after she gets a massage the pain continues to remain in her shoulder blades.   Hand dominance: Right  PERTINENT HISTORY: Per's Dr. Percell note on 7/11/2-25   Chronic left shoulder pain Assessment & Plan: Presentation c/w muscle spasm. Good ROM of left shoulder. No GI symptoms. Question if scoliosis aggravated.  Referral to PT. Trial of flexeril  at bedtime.    Orders: -     Cyclobenzaprine  HCl; Take 1-2 tablets (5-10   PAIN:  Are you having pain? Yes: NPRS scale: 1-2/10 NRPS (currently), 5-6/10 (At worst)  Pain location: bilateral scapulae  Pain description: Shooting    Aggravating factors: Sitting for long periods of time or lifting heavier objects like car seat or even youngest child from time to time.    Relieving factors: Massage    PRECAUTIONS: None  RED FLAGS: None   WEIGHT BEARING RESTRICTIONS: No  FALLS:  Has patient fallen in last 6 months? No  LIVING ENVIRONMENT: Lives with: lives with their spouse Lives in: House/apartment Stairs: Did not ask   Has following equipment at home:  None  OCCUPATION: Works at medical office   PLOF: Independent  PATIENT GOALS: Patient would basically not be able to stop feeling mid back pain and coming up with an exercise plan that would be doable and non-pain provoking.    NEXT MD VISIT: Did not  ask    OBJECTIVE:  Note: Objective measures were completed at Evaluation unless otherwise noted.  VITALS  BP 133/85 HR 83 SpO2  100%  DIAGNOSTIC FINDINGS:  Nothing listed    PATIENT SURVEYS:  Quick Dash:  QUICK DASH  Please rate your ability do the following activities in the last week by selecting the number below the appropriate response.   Activities Rating  Open a tight or new jar.  1 = No difficulty   Do heavy household chores (e.g., wash walls, floors). 2 = Mild difficulty  Carry a shopping bag or briefcase 2 = Mild difficulty  Wash your back. 2 = Mild difficulty  Use a knife to cut food. 1 = No difficulty   Recreational activities in which you take some force or impact through your arm, shoulder or hand (e.g., golf, hammering, tennis, etc.). 3 = Moderate difficulty  During the past week, to what extent has your arm, shoulder or hand problem interfered with your normal social activities with family, friends, neighbors or groups?  2 = Slightly  During the past week, were you limited in your work or other regular daily activities as a result of your arm, shoulder or hand problem? 1 = Not limited at all  Rate the severity of the following symptoms in the last week: Arm, Shoulder, or hand pain. 2 = Mild  Rate the severity of the following symptoms in the last week: Tingling (pins and needles) in your arm, shoulder or hand. 1 = none  During the past week, how much difficulty have you had sleeping because of the pain in your arm, shoulder or hand?  2 = Mild difficulty   (A QuickDASH score may not be calculated if there is greater than 1 missing item.)  Quick Dash Disability/Symptom Score used online form= 18%  Minimally  Clinically Important Difference (  MCID): 15-20 points  (Franchignoni, F. et al. (2013). Minimally clinically important difference of the disabilities of the arm, shoulder, and hand outcome measures (DASH) and its shortened version (Quick DASH). Journal of Orthopaedic & Sports Physical Therapy, 44(1), 30-39)   COGNITION: Overall cognitive status: Within functional limits for tasks assessed     SENSATION: WFL  POSTURE: Forward head and rounded shoulders     UPPER EXTREMITY ROM:   Active ROM Right eval Left eval  Shoulder flexion    Shoulder extension    Shoulder abduction    Shoulder adduction    Shoulder internal rotation    Shoulder external rotation    Elbow flexion    Elbow extension    Wrist flexion    Wrist extension    Wrist ulnar deviation    Wrist radial deviation    Wrist pronation    Wrist supination    (Blank rows = not tested)  UPPER EXTREMITY MMT:  MMT Right eval Left eval  Shoulder flexion 4+ 4+  Shoulder extension 4 4  Shoulder abduction 4 4  Shoulder adduction    Shoulder internal rotation 4 4  Shoulder external rotation 4 4  Shoulder internal rotation at 90 deg ABD  NT NT  Shoulder external rotation at 90 deg ABD   NT NT  Middle trapezius 4 4  Lower trapezius 4 4  Serratus Anterior    4 4  Elbow flexion    Elbow extension    Wrist flexion    Wrist extension    Wrist ulnar deviation    Wrist radial deviation    Wrist pronation    Wrist supination    Grip strength (lbs)    (Blank rows = not tested)  SHOULDER SPECIAL TESTS: Deferred      JOINT MOBILITY TESTING:  Did not perform    PALPATION:  Medial surface of scapulae TTP                                                                                                                               TREATMENT DATE:  09/10/24   THEREX  Upper Trap Stretch 4 x 60 sec   -mod VC for hand position and how to rotate neck for added stretch Seated Shoulder T's AROM 2 x 10   Bent Over Rows  with #10 DB  2 x 10    -mod VC for position of dumbbell and to keep arms close to side wall of body    Modified Cat Cow with 3 sec hold 2 x 10 -Pt reports not feeling as much of stretch in her shoulder blades      Quadruped Cat Cow 2 x 10 with 3 sec hold      PATIENT EDUCATION: Education details: Form and technique for correct performance of exercise and exl Person educated: Patient Education method: Explanation, Demonstration, Verbal cues, and Handouts Education comprehension: verbalized understanding, returned demonstration, and verbal cues required  HOME EXERCISE PROGRAM:   Access Code: [email or web address] URL: [email or web address] Date: 09/10/2024 Prepared by: Toribio Servant  Exercises - Seated Cervical Sidebending Stretch  - 1 x daily - 7 x weekly - 2-3 reps - 30 sec hold - Seated Cervical Sidebending Stretch (Mirrored)  - 1 x daily - 7 x weekly - 2-3 reps - 30 sec hold - Cat Cow  - 1 x daily - 7 x weekly - 2 sets - 10 reps - 3 sec  hold - Standing Bent Over Shoulder Row  - 3-4 x weekly - 3 sets - 10 reps - Seated Shoulder Horizontal Abduction - Thumbs Up  - 3-4 x weekly - 3 sets - 10 reps - Low Trap Setting at Wall  - 3-4 x weekly - 3 sets - 10 reps               ASSESSMENT:  CLINICAL IMPRESSION: Patient is a 38 y.o. white female who was seen today for physical therapy evaluation and treatment for chronic mid back pain that is localized to bilateral medial scapulae.   OBJECTIVE IMPAIRMENTS: decreased knowledge of condition, decreased strength, impaired flexibility, impaired UE functional use, improper body mechanics, postural dysfunction, obesity, and pain.   ACTIVITY LIMITATIONS: carrying, lifting, bathing, dressing, hygiene/grooming, and caring for others  PARTICIPATION LIMITATIONS: cleaning, laundry, and shopping  PERSONAL FACTORS: Time since onset of injury/illness/exacerbation and 3+ comorbidities: Anxiety, Depression, OSA are also affecting patient's  functional outcome.   REHAB POTENTIAL: Good  CLINICAL DECISION MAKING: Stable/uncomplicated  EVALUATION COMPLEXITY: Low   GOALS: Goals reviewed with patient? No  SHORT TERM GOALS: Target date: 09/24/2024  Patient will demonstrate undestanding of home exercise plan by performing exercises correctly with evidence of good carry over with min to no verbal or tactile cues .   Baseline: NT   Goal status: INITIAL  2.  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  Baseline: NT  Goal status: INITIAL    LONG TERM GOALS: Target date: 12/03/2024  Patient will improve bilateral  shoulder function as evidenced by a decrease in QuickDash score of >=15% to return to lifting children and objects needed for child care without pain and discomfort ( Franchignoni, F. et al. 2013). Baseline: 18% Goal status: INITIAL  2.  Patient will improve shoulder and periscapular strength by 1/3 grade MMT (ie 4- to 4) for improved posture and muscular function to relieve pain and discomfort from prolonged desk work and from caring for her children.  Baseline: Shoulder Abd R/L 4/4, Shoulder IR at 0 deg R/L 4/4, Mid Trap R/L 4/4, Lower Trap 4/4, Serratus Anterior R/L 4/4  Goal status: INITIAL  3.  Patient will decrease her periscapular pain to <=3/10 NRPS while performing desk work and child care tasks like lifting car seat or objects as evidence of improved thoracic function to carry out work and child care tasks without being limited by pain and discomfort.  Baseline: 5-6/10 NRPS of medial scapular border    Goal status: INITIAL    PLAN:  PT FREQUENCY: 1x/week  PT DURATION: 12 weeks  PLANNED INTERVENTIONS: 97164- PT Re-evaluation, 97750- Physical Performance Testing, 97110-Therapeutic exercises, 97530-  Therapeutic activity, [id number]- Neuromuscular re-education, 97535- Self Care, [id number]- Manual therapy, [id number]- Gait training, [phone]- Canalith repositioning, [id number]- Aquatic Therapy, [id number]- Electrical stimulation (unattended), [id number]- Electrical stimulation (manual), [phone] (1-2  muscles), 20561 (3+ muscles)- Dry Needling, Patient/Family education, Balance training, Stair training, Taping, Joint mobilization, Joint manipulation, Spinal manipulation, Spinal mobilization, Vestibular training, DME instructions, Cryotherapy, and Moist heat  PLAN FOR NEXT SESSION: Work place setup. Finish shoulder ROM and MMT with 90 deg ab IR and ER. Manual: trigger point release of upper traps, suboccipitals, and cervical paraspinals. Progress shoulder and scapular strengthening exercises: sky punches or foam roll press, D2 PNF Shoulder Flexion and Extension, Rhomboid Stretch    Toribio Servant PT, DPT  [facility] [address] [place], [address] Phone: [phone]   Fax:  [phone]

## 2024-09-10 NOTE — Evaluation (Addendum)
 OUTPATIENT PHYSICAL THERAPY SHOULDER EVALUATION   Patient Name: Kim Garcia MRN: [id number] DOB:Jan 06, 1986, 38 y.o., female Today's Date: 09/09/2024   PT End of Session - 09/10/24 1754     Visit Number 1    Number of Visits 24    Date for Recertification  12/03/24    Authorization Type [employer] [id number]    Authorization - Visit Number 1    Progress Note Due on Visit 10    PT Start Time 1700    PT Stop Time 1745    PT Time Calculation (min) 45 min    Activity Tolerance Patient tolerated treatment well    Behavior During Therapy [facility] for tasks assessed/performed           Past Medical History:  Diagnosis Date   Anemia    Anxiety    Asthma    as child, rarely uses inhaler   Back pain    Bilateral swelling of feet    Depression    Depression    History of blood transfusion 05/2011   1 unit transfused at [facility]   Joint pain    Migraines    Missed ab 2014   no surgery required   Obesity    Seasonal allergies    Sleep apnea    Sleep apnea    SVD (spontaneous vaginal delivery) 05/2011   x 1   Vitamin D  deficiency    Past Surgical History:  Procedure Laterality Date   CESAREAN SECTION N/A 09/27/2014   Procedure: Primary CESAREAN SECTION;  Surgeon: Dickie DELENA Carder, MD;  Location: [facility];  Service: Obstetrics;  Laterality: N/A;  EDD: 10/04/14   CESAREAN SECTION WITH BILATERAL TUBAL LIGATION N/A 02/25/2017   Procedure: REPEAT CESAREAN SECTION WITH BILATERAL TUBAL LIGATION;  Surgeon: Dickie Carder, MD;  Location: [facility];  Service: Obstetrics;  Laterality: N/A;  EDD: 02/28/17   WISDOM TOOTH EXTRACTION     Patient Active Problem List   Diagnosis Date Noted   Left shoulder pain 06/22/2024   Elevated BP without diagnosis of hypertension 11/19/2022   Otitis media 03/23/2022   ADHD 01/11/2022   Insulin resistance 09/25/2020   OSA (obstructive sleep apnea) 09/25/2020   Vitamin D  deficiency 06/16/2018   Annual physical exam 02/02/2018   Class 3 severe obesity with  serious comorbidity and body mass index (BMI) of 45.0 to 49.9 in adult 02/02/2018   Migraine 08/19/2017   Anxiety and depression 04/01/2016    PCP: Dr. Rollene Northern      REFERRING PROVIDER: Dr. Rollene Northern      REFERRING DIAG: [phone] (ICD-10-CM) - Chronic left shoulder pain   THERAPY DIAG:  No diagnosis found.   Rationale for Evaluation and Treatment: Rehabilitation   ONSET DATE: >=3 years ago.     SUBJECTIVE:  SUBJECTIVE STATEMENT: Pt reports that she is feeling increased pain below her shoulder blades and she tends to feel this pain more after a long day of work or lifting a car seat. This has been going on ever since she had her youngest child and it even after she gets a massage the pain continues to remain in her shoulder blades.   Hand dominance: Right   PERTINENT HISTORY: Per's Dr. Percell note on 7/11/2-25    Chronic left shoulder pain Assessment & Plan: Presentation c/w muscle spasm. Good ROM of left shoulder. No GI symptoms. Question if scoliosis aggravated.  Referral to PT. Trial of flexeril  at bedtime.    Orders: -     Cyclobenzaprine  HCl; Take 1-2 tablets (5-10    PAIN:  Are you having pain? Yes: NPRS scale: 1-2/10 NRPS (currently), 5-6/10 (At worst)  Pain location: bilateral scapulae  Pain description: Shooting    Aggravating factors: Sitting for long periods of time or lifting heavier objects like car seat or even youngest child from time to time.    Relieving factors: Massage     PRECAUTIONS: None   RED FLAGS: None      WEIGHT BEARING RESTRICTIONS: No   FALLS:  Has patient fallen in last 6 months? No   LIVING ENVIRONMENT: Lives with: lives with their spouse Lives in: House/apartment Stairs: Did not ask   Has following equipment at home: None    OCCUPATION: Works at medical office    PLOF: Independent   PATIENT GOALS: Patient would basically not be able to stop feeling mid back pain and coming up with an exercise plan that would be doable and non-pain provoking.     NEXT MD VISIT: Did not ask    OBJECTIVE:  Note: Objective measures were completed at Evaluation unless otherwise noted.   VITALS  BP 133/85 HR 83 SpO2  100%   DIAGNOSTIC FINDINGS:  Nothing listed     PATIENT SURVEYS:  Quick Dash:  QUICK DASH   Please rate your ability do the following activities in the last week by selecting the number below the appropriate response.     Activities Rating  Open a tight or new jar.  1 = No difficulty   Do heavy household chores (e.g., wash walls, floors). 2 = Mild difficulty  Carry a shopping bag or briefcase 2 = Mild difficulty  Wash your back. 2 = Mild difficulty  Use a knife to cut food. 1 = No difficulty   Recreational activities in which you take some force or impact through your arm, shoulder or hand (e.g., golf, hammering, tennis, etc.). 3 = Moderate difficulty  During the past week, to what extent has your arm, shoulder or hand problem interfered with your normal social activities with family, friends, neighbors or groups?  2 = Slightly  During the past week, were you limited in your work or other regular daily activities as a result of your arm, shoulder or hand problem? 1 = Not limited at all  Rate the severity of the following symptoms in the last week: Arm, Shoulder, or hand pain. 2 = Mild  Rate the severity of the following symptoms in the last week: Tingling (pins and needles) in your arm, shoulder or hand. 1 = none  During the past week, how much difficulty have you had sleeping because of the pain in your arm, shoulder or hand?  2 = Mild difficulty    (A QuickDASH score may not be calculated if there is  greater than 1 missing item.)   Quick Dash Disability/Symptom Score used online form= 18%   Minimally  Clinically Important Difference (MCID): 15-20 points   (Franchignoni, F. et al. (2013). Minimally clinically important difference of the disabilities of the arm, shoulder, and hand outcome measures (DASH) and its shortened version (Quick DASH). Journal of Orthopaedic & Sports Physical Therapy, 44(1), 30-39)    COGNITION: Overall cognitive status: Within functional limits for tasks assessed                                  SENSATION: WFL   POSTURE: Forward head and rounded shoulders      UPPER EXTREMITY ROM:    Active ROM Right eval Left eval  Shoulder flexion      Shoulder extension      Shoulder abduction      Shoulder adduction      Shoulder internal rotation      Shoulder external rotation      Elbow flexion      Elbow extension      Wrist flexion      Wrist extension      Wrist ulnar deviation      Wrist radial deviation      Wrist pronation      Wrist supination      (Blank rows = not tested)   UPPER EXTREMITY MMT:   MMT Right eval Left eval  Shoulder flexion 4+ 4+  Shoulder extension 4 4  Shoulder abduction 4 4  Shoulder adduction      Shoulder internal rotation 4 4  Shoulder external rotation 4 4  Shoulder internal rotation at 90 deg ABD  NT NT  Shoulder external rotation at 90 deg ABD   NT NT  Middle trapezius 4 4  Lower trapezius 4 4  Serratus Anterior    4 4  Elbow flexion      Elbow extension      Wrist flexion      Wrist extension      Wrist ulnar deviation      Wrist radial deviation      Wrist pronation      Wrist supination      Grip strength (lbs)      (Blank rows = not tested)   SHOULDER SPECIAL TESTS: Deferred        JOINT MOBILITY TESTING:  Did not perform     PALPATION:  Medial surface of scapulae TTP                                                                                                                                         TREATMENT DATE:  10/6  09/10/24   THEREX  Upper Trap Stretch 4 x 60 sec   -mod VC for  hand position and how to rotate neck  for added stretch Seated Shoulder T's AROM 2 x 10   Bent Over Rows with #10 DB  2 x 10    -mod VC for position of dumbbell and to keep arms close to side wall of body    Modified Cat Cow with 3 sec hold 2 x 10 -Pt reports not feeling as much of stretch in her shoulder blades      Quadruped Cat Cow 2 x 10 with 3 sec hold        PATIENT EDUCATION: Education details: Form and technique for correct performance of exercise and exl Person educated: Patient Education method: Explanation, Demonstration, Verbal cues, and Handouts Education comprehension: verbalized understanding, returned demonstration, and verbal cues required   HOME EXERCISE PROGRAM:     Access Code: [email or web address] URL: [email or web address] Date: 09/10/2024 Prepared by: Toribio Servant   Exercises - Seated Cervical Sidebending Stretch  - 1 x daily - 7 x weekly - 2-3 reps - 30 sec hold - Seated Cervical Sidebending Stretch (Mirrored)  - 1 x daily - 7 x weekly - 2-3 reps - 30 sec hold - Cat Cow  - 1 x daily - 7 x weekly - 2 sets - 10 reps - 3 sec  hold - Standing Bent Over Shoulder Row  - 3-4 x weekly - 3 sets - 10 reps - Seated Shoulder Horizontal Abduction - Thumbs Up  - 3-4 x weekly - 3 sets - 10 reps - Low Trap Setting at Wall  - 3-4 x weekly - 3 sets - 10 reps                 ASSESSMENT:   CLINICAL IMPRESSION: Patient is a 38 y.o. white female who was seen today for physical therapy evaluation and treatment for chronic mid back pain that is localized to bilateral medial scapulae. She shows increased pain and soreness in periscapular muscles and postural abnormalities forward head and rounded shoulders like due to desk work. 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     OBJECTIVE IMPAIRMENTS: decreased knowledge of condition, decreased strength, impaired  flexibility, impaired UE functional use, improper body mechanics, postural dysfunction, obesity, and pain.    ACTIVITY LIMITATIONS: carrying, lifting, bathing, dressing, hygiene/grooming, and caring for others   PARTICIPATION LIMITATIONS: cleaning, laundry, and shopping   PERSONAL FACTORS: Time since onset of injury/illness/exacerbation and 3+ comorbidities: Anxiety, Depression, OSA are also affecting patient's functional outcome.    REHAB POTENTIAL: Good   CLINICAL DECISION MAKING: Stable/uncomplicated   EVALUATION COMPLEXITY: Low     GOALS: Goals reviewed with patient? No   SHORT TERM GOALS: Target date: 09/24/2024   Patient will demonstrate undestanding of home exercise plan by performing exercises correctly with evidence of good carry over with min to no verbal or tactile cues .   Baseline: NT   Goal status: INITIAL   2.  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 et al. 2013). Baseline: 18% Goal status: ONGOING    2.  Patient will improve shoulder and periscapular strength by 1/3 grade MMT (ie 4- to 4) for improved posture and muscular function to relieve pain and discomfort from prolonged desk work and from caring for her children.  Baseline: Shoulder Abd R/L 4/4, Shoulder IR at 0 deg R/L 4/4, Mid Trap R/L 4/4, Lower Trap 4/4, Serratus Anterior R/L 4/4  Goal status: ONGOING   3.  Patient will decrease her periscapular pain to <=3/10 NRPS while performing desk work and child  care tasks like lifting car seat or objects as evidence of improved thoracic function to carry out work and child care tasks without being limited by pain and discomfort.  Baseline: 5-6/10 NRPS of medial scapular border    Goal status: ONGOING        PLAN:   PT FREQUENCY: 1x/week   PT DURATION: 12 weeks   PLANNED INTERVENTIONS: 97164- PT Re-evaluation, 97750- Physical Performance Testing, 97110-Therapeutic exercises, 97530- Therapeutic activity, [id number]- Neuromuscular re-education, 97535- Self Care, [id number]- Manual therapy, [phone]- Gait training, [phone]- Canalith repositioning, [id number]- Aquatic Therapy, [phone]- Electrical stimulation (unattended), [phone]- Electrical stimulation (manual), 20560 (1-2 muscles), 20561 (3+ muscles)- Dry Needling, Patient/Family education, Balance training, Stair training, Taping, Joint mobilization, Joint manipulation, Spinal manipulation, Spinal mobilization, Vestibular training, DME instructions, Cryotherapy, and Moist heat   PLAN FOR NEXT SESSION: Work place setup. Finish shoulder ROM and MMT with 90 deg ab IR and ER. Manual: trigger point release of upper traps, suboccipitals, and cervical paraspinals. Progress shoulder and scapular strengthening exercises: sky punches or foam roll press, D2 PNF Shoulder Flexion and Extension, Rhomboid Stretch     Toribio JINNY Servant, PT DPT 09/09/2024, 9:00 PM

## 2024-09-12 ENCOUNTER — Ambulatory Visit: Admitting: Physical Therapy

## 2024-09-17 ENCOUNTER — Encounter: Payer: Self-pay | Admitting: Physical Therapy

## 2024-09-17 ENCOUNTER — Encounter (INDEPENDENT_AMBULATORY_CARE_PROVIDER_SITE_OTHER): Payer: Self-pay

## 2024-09-17 ENCOUNTER — Ambulatory Visit: Attending: Family | Admitting: Physical Therapy

## 2024-09-17 DIAGNOSIS — M25512 Pain in left shoulder: Secondary | ICD-10-CM | POA: Diagnosis present

## 2024-09-17 DIAGNOSIS — G8929 Other chronic pain: Secondary | ICD-10-CM | POA: Diagnosis present

## 2024-09-17 DIAGNOSIS — M25511 Pain in right shoulder: Secondary | ICD-10-CM | POA: Insufficient documentation

## 2024-09-17 DIAGNOSIS — M898X1 Other specified disorders of bone, shoulder: Secondary | ICD-10-CM | POA: Diagnosis present

## 2024-09-17 NOTE — Therapy (Signed)
 OUTPATIENT PHYSICAL THERAPY SHOULDER TREATMENT    Patient Name: Kim Garcia MRN: [id number] DOB:Jun 23, 1986, 38 y.o., female Today's Date: 09/09/2024  END OF SESSION:  PT End of Session - 09/17/24 1736     Visit Number 2    Number of Visits 24    Date for Recertification  12/03/24    Authorization Type [employer] [id number]    Authorization - Visit Number 2    Progress Note Due on Visit 10    PT Start Time 1730    PT Stop Time 1815    PT Time Calculation (min) 45 min    Activity Tolerance Patient tolerated treatment well    Behavior During Therapy [facility] for tasks assessed/performed           Past Medical History:  Diagnosis Date   Anemia    Anxiety    Asthma    as child, rarely uses inhaler   Back pain    Bilateral swelling of feet    Depression    Depression    History of blood transfusion 05/2011   1 unit transfused at [facility]   Joint pain    Migraines    Missed ab 2014   no surgery required   Obesity    Seasonal allergies    Sleep apnea    Sleep apnea    SVD (spontaneous vaginal delivery) 05/2011   x 1   Vitamin D  deficiency    Past Surgical History:  Procedure Laterality Date   CESAREAN SECTION N/A 09/27/2014   Procedure: Primary CESAREAN SECTION;  Surgeon: Dickie DELENA Carder, MD;  Location: [facility];  Service: Obstetrics;  Laterality: N/A;  EDD: 10/04/14   CESAREAN SECTION WITH BILATERAL TUBAL LIGATION N/A 02/25/2017   Procedure: REPEAT CESAREAN SECTION WITH BILATERAL TUBAL LIGATION;  Surgeon: Dickie Carder, MD;  Location: [facility];  Service: Obstetrics;  Laterality: N/A;  EDD: 02/28/17   WISDOM TOOTH EXTRACTION     Patient Active Problem List   Diagnosis Date Noted   Left shoulder pain 06/22/2024   Elevated BP without diagnosis of hypertension 11/19/2022   Otitis media 03/23/2022   ADHD 01/11/2022   Insulin resistance 09/25/2020   OSA (obstructive sleep apnea) 09/25/2020   Vitamin D  deficiency 06/16/2018   Annual physical exam 02/02/2018   Class 3  severe obesity with serious comorbidity and body mass index (BMI) of 45.0 to 49.9 in adult 02/02/2018   Migraine 08/19/2017   Anxiety and depression 04/01/2016    PCP: Dr. Rollene Northern     REFERRING PROVIDER: Dr. Rollene Northern     REFERRING DIAG: [phone] (ICD-10-CM) - Chronic left shoulder pain  THERAPY DIAG:  No diagnosis found.  Rationale for Evaluation and Treatment: Rehabilitation  ONSET DATE: >=3 years ago.    SUBJECTIVE:  SUBJECTIVE STATEMENT: Pt reports some soreness from exercises but otherwise she has been doing well.  Hand dominance: Right  PERTINENT HISTORY: Per's Dr. Percell note on 7/11/2-25   Chronic left shoulder pain Assessment & Plan: Presentation c/w muscle spasm. Good ROM of left shoulder. No GI symptoms. Question if scoliosis aggravated.  Referral to PT. Trial of flexeril  at bedtime.    Orders: -     Cyclobenzaprine  HCl; Take 1-2 tablets (5-10   PAIN:  Are you having pain? Yes: NPRS scale: 1-2/10 NRPS (currently), 5-6/10 (At worst)  Pain location: bilateral scapulae  Pain description: Shooting    Aggravating factors: Sitting for long periods of time or lifting heavier objects like car seat or even youngest child from time to time.    Relieving factors: Massage    PRECAUTIONS: None  RED FLAGS: None   WEIGHT BEARING RESTRICTIONS: No  FALLS:  Has patient fallen in last 6 months? No  LIVING ENVIRONMENT: Lives with: lives with their spouse Lives in: House/apartment Stairs: Did not ask   Has following equipment at home: None  OCCUPATION: Works at medical office   PLOF: Independent  PATIENT GOALS: Patient would basically not be able to stop feeling mid back pain and coming up with an exercise plan that would be doable and non-pain provoking.    NEXT  MD VISIT: Did not  ask    OBJECTIVE:  Note: Objective measures were completed at Evaluation unless otherwise noted.  VITALS  BP 133/85 HR 83 SpO2  100%  DIAGNOSTIC FINDINGS:  Nothing listed    PATIENT SURVEYS:  Quick Dash:  QUICK DASH  Please rate your ability do the following activities in the last week by selecting the number below the appropriate response.   Activities Rating  Open a tight or new jar.  1 = No difficulty   Do heavy household chores (e.g., wash walls, floors). 2 = Mild difficulty  Carry a shopping bag or briefcase 2 = Mild difficulty  Wash your back. 2 = Mild difficulty  Use a knife to cut food. 1 = No difficulty   Recreational activities in which you take some force or impact through your arm, shoulder or hand (e.g., golf, hammering, tennis, etc.). 3 = Moderate difficulty  During the past week, to what extent has your arm, shoulder or hand problem interfered with your normal social activities with family, friends, neighbors or groups?  2 = Slightly  During the past week, were you limited in your work or other regular daily activities as a result of your arm, shoulder or hand problem? 1 = Not limited at all  Rate the severity of the following symptoms in the last week: Arm, Shoulder, or hand pain. 2 = Mild  Rate the severity of the following symptoms in the last week: Tingling (pins and needles) in your arm, shoulder or hand. 1 = none  During the past week, how much difficulty have you had sleeping because of the pain in your arm, shoulder or hand?  2 = Mild difficulty   (A QuickDASH score may not be calculated if there is greater than 1 missing item.)  Quick Dash Disability/Symptom Score used online form= 18%  Minimally Clinically Important Difference (MCID): 15-20 points  (Franchignoni, F. et al. (2013). Minimally clinically important difference of the disabilities of the arm, shoulder, and hand outcome measures (DASH) and its shortened version (Quick DASH).  Journal of Orthopaedic & Sports Physical Therapy, 44(1), 30-39)   COGNITION: Overall cognitive status: Within functional  limits for tasks assessed     SENSATION: WFL  POSTURE: Forward head and rounded shoulders     UPPER EXTREMITY ROM:   Active ROM Right eval Left eval  Shoulder flexion    Shoulder extension    Shoulder abduction    Shoulder adduction    Shoulder internal rotation    Shoulder external rotation    Elbow flexion    Elbow extension    Wrist flexion    Wrist extension    Wrist ulnar deviation    Wrist radial deviation    Wrist pronation    Wrist supination    (Blank rows = not tested)  UPPER EXTREMITY MMT:  MMT Right eval Left eval  Shoulder flexion 4+ 4+  Shoulder extension 4 4  Shoulder abduction 4 4  Shoulder adduction    Shoulder internal rotation 4 4  Shoulder external rotation 4 4  Shoulder internal rotation at 90 deg ABD  NT NT  Shoulder external rotation at 90 deg ABD   NT NT  Middle trapezius 4 4  Lower trapezius 4 4  Serratus Anterior    4 4  Elbow flexion    Elbow extension    Wrist flexion    Wrist extension    Wrist ulnar deviation    Wrist radial deviation    Wrist pronation    Wrist supination    Grip strength (lbs)    (Blank rows = not tested)  SHOULDER SPECIAL TESTS: Deferred      JOINT MOBILITY TESTING:  Did not perform    PALPATION:  Medial surface of scapulae TTP                                                                                                                               TREATMENT DATE:  09/17/26:  THEREX   UBE  with seat at 7 2.5 min forward and 2.5 min backward  OMEGA Lat Pull Down  #20 lbs  2 x 10   -min VC for sequencing of exercise including increased lumbar extension  OMEGA Lat Pull Down #25 lbs 1 x 10   MANUAL   Trigger point release and massage of left rhomboid and bilateral upper traps     NMR  Scapular retraction in sitting with mirror present  2 x 10 with 3 sec hold   -min VC  to look upward to increased upright posture    PATIENT EDUCATION: Education details: Form and technique for correct performance of exercise and exl Person educated: Patient Education method: Programmer, multimedia, Facilities manager, Verbal cues, and Handouts Education comprehension: verbalized understanding, returned demonstration, and verbal cues required  HOME EXERCISE PROGRAM:  Access Code: [email or web address] URL: [email or web address] Date: 09/10/2024 Prepared by: Toribio Servant  Exercises - Seated Cervical Sidebending Stretch  - 1 x daily - 7 x weekly - 2-3 reps - 30 sec hold - Seated Cervical Sidebending Stretch (Mirrored)  - 1 x daily - 7 x weekly -  2-3 reps - 30 sec hold - Cat Cow  - 1 x daily - 7 x weekly - 2 sets - 10 reps - 3 sec  hold - Standing Bent Over Shoulder Row  - 3-4 x weekly - 3 sets - 10 reps - Seated Shoulder Horizontal Abduction - Thumbs Up  - 3-4 x weekly - 3 sets - 10 reps - Low Trap Setting at Wall  - 3-4 x weekly - 3 sets - 10 reps               ASSESSMENT:  CLINICAL IMPRESSION: Pt shows ongoing muscular tension throughout periscapular region with right rhomboids being worse than left. She exhibits forward head and rounded that are contributing to these muscle imbalances especially given prolonged sitting at work. Pt focused on increased scapular retraction activation in sitting to improved posture with visual input for improved sequencing. PT utilized manual therapy to improved increased muscular tension and to decrease trigger points in right rhomboids. She will continue to benefit from skilled PT to improved posture to decreased forward head and rounded shoulders to decrease neck pain and discomfort when performing seated tasks for job.     OBJECTIVE IMPAIRMENTS: decreased knowledge of condition, decreased strength, impaired flexibility, impaired UE functional use, improper body mechanics, postural dysfunction, obesity, and pain.   ACTIVITY LIMITATIONS: carrying,  lifting, bathing, dressing, hygiene/grooming, and caring for others  PARTICIPATION LIMITATIONS: cleaning, laundry, and shopping  PERSONAL FACTORS: Time since onset of injury/illness/exacerbation and 3+ comorbidities: Anxiety, Depression, OSA are also affecting patient's functional outcome.   REHAB POTENTIAL: Good  CLINICAL DECISION MAKING: Stable/uncomplicated  EVALUATION COMPLEXITY: Low   GOALS: Goals reviewed with patient? No  SHORT TERM GOALS: Target date: 09/24/2024  Patient will demonstrate undestanding of home exercise plan by performing exercises correctly with evidence of good carry over with min to no verbal or tactile cues .   Baseline: NT  09/17/24: Performing independently   Goal status: ACHIEVED    2.  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 et al. 2013). Baseline: 18% Goal status: ONGOING   2.  Patient will improve shoulder and periscapular strength by 1/3 grade MMT (ie 4- to 4) for improved posture and muscular function to relieve pain and discomfort from prolonged desk work and from caring for her children.  Baseline: Shoulder Abd R/L 4/4, Shoulder IR at 0 deg R/L 4/4, Mid Trap R/L 4/4, Lower Trap 4/4, Serratus Anterior R/L 4/4  Goal status: ONGOING   3.  Patient will decrease her periscapular pain to <=3/10 NRPS while performing desk work and child care tasks like lifting car seat or objects as evidence of improved thoracic function to carry out work and child care  tasks without being limited by pain and discomfort.  Baseline: 5-6/10 NRPS of medial scapular border    Goal status: ONGOING       PLAN:  PT FREQUENCY: 1x/week  PT DURATION: 12 weeks  PLANNED INTERVENTIONS: 97164- PT Re-evaluation, 97750- Physical Performance Testing, 97110-Therapeutic exercises, 97530-  Therapeutic activity, [id number]- Neuromuscular re-education, [phone]- Self Care, [id number]- Manual therapy, [id number]- Gait training, [phone]- Canalith repositioning, [id number]- Aquatic Therapy, [phone]- Electrical stimulation (unattended), [phone]- Electrical stimulation (manual), [phone] (1-2 muscles), [id number] (3+ muscles)- Dry Needling, Patient/Family education, Balance training, Stair training, Taping, Joint mobilization, Joint manipulation, Spinal manipulation, Spinal mobilization, Vestibular training, DME instructions, Cryotherapy, and Moist heat  PLAN FOR NEXT SESSION:  Continue with manual: trigger point release of upper traps, suboccipitals, and cervical paraspinals. Finish shoulder ROM and MMT with 90 deg ab IR and ER.  Progress shoulder and scapular strengthening exercises: sky punches or foam roll press, D2 PNF Shoulder Flexion and Extension, Rhomboid Stretch    Toribio Servant PT, DPT  [facility] [address], [place], [address] Phone: [phone]   Fax:  [phone]

## 2024-09-19 ENCOUNTER — Encounter: Admitting: Physical Therapy

## 2024-09-24 ENCOUNTER — Ambulatory Visit: Admitting: Physical Therapy

## 2024-09-24 DIAGNOSIS — M25511 Pain in right shoulder: Secondary | ICD-10-CM | POA: Diagnosis not present

## 2024-09-24 DIAGNOSIS — M898X1 Other specified disorders of bone, shoulder: Secondary | ICD-10-CM

## 2024-09-24 DIAGNOSIS — G8929 Other chronic pain: Secondary | ICD-10-CM

## 2024-09-24 NOTE — Therapy (Signed)
 OUTPATIENT PHYSICAL THERAPY SHOULDER TREATMENT    Patient Name: Kim Garcia MRN: [id number] DOB:07-13-1986, 38 y.o., female Today's Date: 09/09/2024  END OF SESSION:  PT End of Session - 09/24/24 1734     Visit Number 3    Number of Visits 24    Date for Recertification  12/03/24    Authorization Type [employer] [id number]    Authorization - Visit Number 3    Authorization - Number of Visits --    Progress Note Due on Visit 10    PT Start Time 1730    PT Stop Time 1815    PT Time Calculation (min) 45 min    Activity Tolerance Patient tolerated treatment well    Behavior During Therapy [facility] for tasks assessed/performed           Past Medical History:  Diagnosis Date   Anemia    Anxiety    Asthma    as child, rarely uses inhaler   Back pain    Bilateral swelling of feet    Depression    Depression    History of blood transfusion 05/2011   1 unit transfused at [facility]   Joint pain    Migraines    Missed ab 2014   no surgery required   Obesity    Seasonal allergies    Sleep apnea    Sleep apnea    SVD (spontaneous vaginal delivery) 05/2011   x 1   Vitamin D  deficiency    Past Surgical History:  Procedure Laterality Date   CESAREAN SECTION N/A 09/27/2014   Procedure: Primary CESAREAN SECTION;  Surgeon: Dickie DELENA Carder, MD;  Location: [facility];  Service: Obstetrics;  Laterality: N/A;  EDD: 10/04/14   CESAREAN SECTION WITH BILATERAL TUBAL LIGATION N/A 02/25/2017   Procedure: REPEAT CESAREAN SECTION WITH BILATERAL TUBAL LIGATION;  Surgeon: Dickie Carder, MD;  Location: [facility];  Service: Obstetrics;  Laterality: N/A;  EDD: 02/28/17   WISDOM TOOTH EXTRACTION     Patient Active Problem List   Diagnosis Date Noted   Left shoulder pain 06/22/2024   Elevated BP without diagnosis of hypertension 11/19/2022   Otitis media 03/23/2022   ADHD 01/11/2022   Insulin resistance 09/25/2020   OSA (obstructive sleep apnea) 09/25/2020   Vitamin D  deficiency 06/16/2018   Annual  physical exam 02/02/2018   Class 3 severe obesity with serious comorbidity and body mass index (BMI) of 45.0 to 49.9 in adult 02/02/2018   Migraine 08/19/2017   Anxiety and depression 04/01/2016    PCP: Dr. Rollene Northern     REFERRING PROVIDER: Dr. Rollene Northern     REFERRING DIAG: [phone] (ICD-10-CM) - Chronic left shoulder pain  THERAPY DIAG:  No diagnosis found.  Rationale for Evaluation and Treatment: Rehabilitation  ONSET DATE: >=3 years ago.    SUBJECTIVE:  SUBJECTIVE STATEMENT: Pt states that she is feeling increased soreness on her left side and she attributes this to stress.   Hand dominance: Right  PERTINENT HISTORY: Per's Dr. Percell note on 7/11/2-25   Chronic left shoulder pain Assessment & Plan: Presentation c/w muscle spasm. Good ROM of left shoulder. No GI symptoms. Question if scoliosis aggravated.  Referral to PT. Trial of flexeril  at bedtime.    Orders: -     Cyclobenzaprine  HCl; Take 1-2 tablets (5-10   PAIN:  Are you having pain? Yes: NPRS scale: 1-2/10 NRPS (currently), 5-6/10 (At worst)  Pain location: bilateral scapulae  Pain description: Shooting    Aggravating factors: Sitting for long periods of time or lifting heavier objects like car seat or even youngest child from time to time.    Relieving factors: Massage    PRECAUTIONS: None  RED FLAGS: None   WEIGHT BEARING RESTRICTIONS: No  FALLS:  Has patient fallen in last 6 months? No  LIVING ENVIRONMENT: Lives with: lives with their spouse Lives in: House/apartment Stairs: Did not ask   Has following equipment at home: None  OCCUPATION: Works at medical office   PLOF: Independent  PATIENT GOALS: Patient would basically not be able to stop feeling mid back pain and coming up with an exercise  plan that would be doable and non-pain provoking.    NEXT MD VISIT: Did not  ask    OBJECTIVE:  Note: Objective measures were completed at Evaluation unless otherwise noted.  VITALS  BP 133/85 HR 83 SpO2  100%  DIAGNOSTIC FINDINGS:  Nothing listed    PATIENT SURVEYS:  Quick Dash:  QUICK DASH  Please rate your ability do the following activities in the last week by selecting the number below the appropriate response.   Activities Rating  Open a tight or new jar.  1 = No difficulty   Do heavy household chores (e.g., wash walls, floors). 2 = Mild difficulty  Carry a shopping bag or briefcase 2 = Mild difficulty  Wash your back. 2 = Mild difficulty  Use a knife to cut food. 1 = No difficulty   Recreational activities in which you take some force or impact through your arm, shoulder or hand (e.g., golf, hammering, tennis, etc.). 3 = Moderate difficulty  During the past week, to what extent has your arm, shoulder or hand problem interfered with your normal social activities with family, friends, neighbors or groups?  2 = Slightly  During the past week, were you limited in your work or other regular daily activities as a result of your arm, shoulder or hand problem? 1 = Not limited at all  Rate the severity of the following symptoms in the last week: Arm, Shoulder, or hand pain. 2 = Mild  Rate the severity of the following symptoms in the last week: Tingling (pins and needles) in your arm, shoulder or hand. 1 = none  During the past week, how much difficulty have you had sleeping because of the pain in your arm, shoulder or hand?  2 = Mild difficulty   (A QuickDASH score may not be calculated if there is greater than 1 missing item.)  Quick Dash Disability/Symptom Score used online form= 18%  Minimally Clinically Important Difference (MCID): 15-20 points  (Franchignoni, F. et al. (2013). Minimally clinically important difference of the disabilities of the arm, shoulder, and hand  outcome measures (DASH) and its shortened version (Quick DASH). Journal of Orthopaedic & Sports Physical Therapy, 44(1), 30-39)  COGNITION: Overall cognitive status: Within functional limits for tasks assessed     SENSATION: WFL  POSTURE: Forward head and rounded shoulders     UPPER EXTREMITY ROM:   Active ROM Right eval Left eval  Shoulder flexion    Shoulder extension    Shoulder abduction    Shoulder adduction    Shoulder internal rotation    Shoulder external rotation    Elbow flexion    Elbow extension    Wrist flexion    Wrist extension    Wrist ulnar deviation    Wrist radial deviation    Wrist pronation    Wrist supination    (Blank rows = not tested)  UPPER EXTREMITY MMT:  MMT Right eval Left eval  Shoulder flexion 4+ 4+  Shoulder extension 4 4  Shoulder abduction 4 4  Shoulder adduction    Shoulder internal rotation 4 4  Shoulder external rotation 4 4  Shoulder internal rotation at 90 deg ABD  NT NT  Shoulder external rotation at 90 deg ABD   NT NT  Middle trapezius 4 4  Lower trapezius 4 4  Serratus Anterior    4 4  Elbow flexion    Elbow extension    Wrist flexion    Wrist extension    Wrist ulnar deviation    Wrist radial deviation    Wrist pronation    Wrist supination    Grip strength (lbs)    (Blank rows = not tested)  SHOULDER SPECIAL TESTS: Deferred      JOINT MOBILITY TESTING:  Did not perform    PALPATION:  Medial surface of scapulae TTP                                                                                                                               TREATMENT DATE:   09/24/24: THEREX   UBE 2.5 min forward an 2.5 MIN backward   Shoulder D2 PNF Flexion Extension  AROM 3 x 10   Shoulder D2 PNF Flexion Extension #1 DB  1 x 10  Serratus Activation at Wall with Foam Roll 2 x 10     MANUAL  Bilateral upper trap trigger point release and massage    PATIENT EDUCATION: Education details: Form and technique for  correct performance of exercise and exl Person educated: Patient Education method: Programmer, multimedia, Facilities manager, Verbal cues, and Handouts Education comprehension: verbalized understanding, returned demonstration, and verbal cues required  HOME EXERCISE PROGRAM:  Access Code: [email or web address] URL: [email or web address] Date: 09/24/2024 Prepared by: Toribio Servant  Exercises - Seated Cervical Sidebending Stretch  - 1 x daily - 7 x weekly - 2-3 reps - 30 sec hold - Seated Cervical Sidebending Stretch (Mirrored)  - 1 x daily - 7 x weekly - 2-3 reps - 30 sec hold - Cat Cow  - 1 x daily - 7 x weekly - 2 sets - 10 reps - 3 sec  hold - Seated Scapular  Retraction  - 1 x daily - 7 x weekly - 2 sets - 10 reps - 3 sec  hold - Serratus Activation at Wall with Foam Roll  - 3-4 x weekly - 3 sets - 10 reps - Standing Bent Over Shoulder Row  - 3-4 x weekly - 3 sets - 10 reps - Seated Shoulder Horizontal Abduction - Thumbs Up  - 3-4 x weekly - 3 sets - 10 reps - Shoulder PNF D2 Flexion  - 3-4 x weekly - 3 sets - 10 reps  ASSESSMENT:  CLINICAL IMPRESSION: Despite increased left upper trap tension, she was able to perform all exercises without an increase in left upper trap pain. She exhibits improved periscapular strength with ability to perform D2 PNF flexion and extension. She will continue to benefit from skilled PT to improved posture to decreased forward head and rounded shoulders to decrease neck pain and discomfort when performing seated tasks for job.     OBJECTIVE IMPAIRMENTS: decreased knowledge of condition, decreased strength, impaired flexibility, impaired UE functional use, improper body mechanics, postural dysfunction, obesity, and pain.   ACTIVITY LIMITATIONS: carrying, lifting, bathing, dressing, hygiene/grooming, and caring for others  PARTICIPATION LIMITATIONS: cleaning, laundry, and shopping  PERSONAL FACTORS: Time since onset of injury/illness/exacerbation and 3+  comorbidities: Anxiety, Depression, OSA are also affecting patient's functional outcome.   REHAB POTENTIAL: Good  CLINICAL DECISION MAKING: Stable/uncomplicated  EVALUATION COMPLEXITY: Low   GOALS: Goals reviewed with patient? No  SHORT TERM GOALS: Target date: 09/24/2024  Patient will demonstrate undestanding of home exercise plan by performing exercises correctly with evidence of good carry over with min to no verbal or tactile cues .   Baseline: NT  09/17/24: Performing independently   Goal status: ACHIEVED    2.  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 et al. 2013). Baseline: 18% Goal status: ONGOING   2.  Patient will improve shoulder and periscapular strength by 1/3 grade MMT (ie 4- to 4) for improved posture and muscular function to relieve pain and discomfort from prolonged desk work and from caring for her children.  Baseline: Shoulder Abd R/L 4/4, Shoulder IR at 0 deg R/L 4/4, Mid Trap R/L 4/4, Lower Trap 4/4, Serratus Anterior R/L 4/4  Goal status: ONGOING   3.  Patient will decrease her periscapular pain to <=3/10 NRPS while performing desk work and child care tasks like lifting car seat or objects as evidence of improved thoracic function to carry out work and child care tasks without being limited by pain and discomfort.  Baseline: 5-6/10 NRPS of medial scapular border    Goal status: ONGOING       PLAN:  PT FREQUENCY: 1x/week  PT DURATION: 12 weeks  PLANNED  INTERVENTIONS: 97164- PT Re-evaluation, 97750- Physical Performance Testing, 97110-Therapeutic exercises, 97530- Therapeutic activity, [id number]- Neuromuscular re-education, 97535- Self Care, [id number]- Manual therapy, [id number]- Gait training, [phone]- Canalith repositioning, [id number]- Aquatic Therapy, [id number]- Electrical stimulation (unattended), [id number]- Electrical stimulation (manual), 20560 (1-2 muscles), 20561 (3+ muscles)- Dry Needling, Patient/Family  education, Balance training, Stair training, Taping, Joint mobilization, Joint manipulation, Spinal manipulation, Spinal mobilization, Vestibular training, DME instructions, Cryotherapy, and Moist heat  PLAN FOR NEXT SESSION:  Continue with manual: trigger point release of upper traps, suboccipitals, and cervical paraspinals. Finish shoulder ROM and MMT with 90 deg ab IR and ER.  Progress shoulder and scapular strengthening exercises: rhomboid stretch and  prone t's and I's    Toribio Servant PT, DPT  [facility] [address] [place], [address] Phone: [phone]   Fax:  [phone]

## 2024-09-25 ENCOUNTER — Other Ambulatory Visit: Payer: Self-pay | Admitting: Medical Genetics

## 2024-09-25 DIAGNOSIS — Z006 Encounter for examination for normal comparison and control in clinical research program: Secondary | ICD-10-CM

## 2024-09-26 ENCOUNTER — Encounter: Admitting: Physical Therapy

## 2024-10-01 ENCOUNTER — Ambulatory Visit: Admitting: Physical Therapy

## 2024-10-02 ENCOUNTER — Telehealth: Payer: Self-pay | Admitting: Physical Therapy

## 2024-10-02 NOTE — Telephone Encounter (Signed)
 Called pt to  inquire about absence from PT. Pt did not respond so left VM instructing pt about No Show policy and concern for her absence.

## 2024-10-03 ENCOUNTER — Encounter: Admitting: Physical Therapy

## 2024-10-10 ENCOUNTER — Encounter: Admitting: Physical Therapy

## 2024-10-15 ENCOUNTER — Ambulatory Visit: Admitting: Physical Therapy

## 2024-10-16 ENCOUNTER — Encounter: Payer: Self-pay | Admitting: Family

## 2024-10-16 DIAGNOSIS — E88819 Insulin resistance, unspecified: Secondary | ICD-10-CM

## 2024-10-16 NOTE — Telephone Encounter (Signed)
 Please advise. Is it okay to increase this medication dosage?

## 2024-10-17 ENCOUNTER — Ambulatory Visit: Attending: Family

## 2024-10-17 ENCOUNTER — Encounter: Payer: Self-pay | Admitting: Physical Therapy

## 2024-10-17 DIAGNOSIS — G8929 Other chronic pain: Secondary | ICD-10-CM | POA: Insufficient documentation

## 2024-10-17 DIAGNOSIS — M25512 Pain in left shoulder: Secondary | ICD-10-CM | POA: Insufficient documentation

## 2024-10-17 DIAGNOSIS — M25511 Pain in right shoulder: Secondary | ICD-10-CM | POA: Diagnosis present

## 2024-10-17 DIAGNOSIS — M898X1 Other specified disorders of bone, shoulder: Secondary | ICD-10-CM | POA: Diagnosis present

## 2024-10-17 NOTE — Therapy (Signed)
 OUTPATIENT PHYSICAL THERAPY SHOULDER TREATMENT    Patient Name: Kim Garcia MRN: [id number] DOB:Apr 27, 1986, 38 y.o., female Today's Date: 09/09/2024  END OF SESSION:  PT End of Session - 10/17/24 1807     Visit Number 4    Number of Visits 24    Date for Recertification  12/03/24    Authorization Type UMR [id number]    Progress Note Due on Visit 10    PT Start Time 1807    PT Stop Time 1848    PT Time Calculation (min) 41 min    Activity Tolerance Patient tolerated treatment well    Behavior During Therapy [facility] for tasks assessed/performed            Past Medical History:  Diagnosis Date   Anemia    Anxiety    Asthma    as child, rarely uses inhaler   Back pain    Bilateral swelling of feet    Depression    Depression    History of blood transfusion 05/2011   1 unit transfused at [facility]   Joint pain    Migraines    Missed ab 2014   no surgery required   Obesity    Seasonal allergies    Sleep apnea    Sleep apnea    SVD (spontaneous vaginal delivery) 05/2011   x 1   Vitamin D  deficiency    Past Surgical History:  Procedure Laterality Date   CESAREAN SECTION N/A 09/27/2014   Procedure: Primary CESAREAN SECTION;  Surgeon: Dickie DELENA Carder, MD;  Location: [facility];  Service: Obstetrics;  Laterality: N/A;  EDD: 10/04/14   CESAREAN SECTION WITH BILATERAL TUBAL LIGATION N/A 02/25/2017   Procedure: REPEAT CESAREAN SECTION WITH BILATERAL TUBAL LIGATION;  Surgeon: Dickie Carder, MD;  Location: [facility];  Service: Obstetrics;  Laterality: N/A;  EDD: 02/28/17   WISDOM TOOTH EXTRACTION     Patient Active Problem List   Diagnosis Date Noted   Left shoulder pain 06/22/2024   Elevated BP without diagnosis of hypertension 11/19/2022   Otitis media 03/23/2022   ADHD 01/11/2022   Insulin resistance 09/25/2020   OSA (obstructive sleep apnea) 09/25/2020   Vitamin D  deficiency 06/16/2018   Annual physical exam 02/02/2018   Class 3 severe obesity with serious  comorbidity and body mass index (BMI) of 45.0 to 49.9 in adult 02/02/2018   Migraine 08/19/2017   Anxiety and depression 04/01/2016    PCP: Dr. Rollene Northern     REFERRING PROVIDER: Dr. Rollene Northern     REFERRING DIAG: [phone] (ICD-10-CM) - Chronic left shoulder pain  THERAPY DIAG:  No diagnosis found.  Rationale for Evaluation and Treatment: Rehabilitation  ONSET DATE: >=3 years ago.    SUBJECTIVE:  SUBJECTIVE STATEMENT:   Patient states she went to the gym with her daughter and she was able to do lat pull down and upper extremity exercises without pain but was sore the next day.   Hand dominance: Right  PERTINENT HISTORY: Per's Dr. Percell note on 7/11/2-25   Chronic left shoulder pain Assessment & Plan: Presentation c/w muscle spasm. Good ROM of left shoulder. No GI symptoms. Question if scoliosis aggravated.  Referral to PT. Trial of flexeril  at bedtime.    Orders: -     Cyclobenzaprine  HCl; Take 1-2 tablets (5-10   PAIN:  Are you having pain? Yes: NPRS scale: 1-2/10 NRPS (currently), 5-6/10 (At worst)  Pain location: bilateral scapulae  Pain description: Shooting    Aggravating factors: Sitting for long periods of time or lifting heavier objects like car seat or even youngest child from time to time.    Relieving factors: Massage    PRECAUTIONS: None  RED FLAGS: None   WEIGHT BEARING RESTRICTIONS: No  FALLS:  Has patient fallen in last 6 months? No  LIVING ENVIRONMENT: Lives with: lives with their spouse Lives in: House/apartment Stairs: Did not ask   Has following equipment at home: None  OCCUPATION: Works at medical office   PLOF: Independent  PATIENT GOALS: Patient would basically not be able to stop feeling mid back pain and coming up with an exercise  plan that would be doable and non-pain provoking.    NEXT MD VISIT: Did not  ask    OBJECTIVE:  Note: Objective measures were completed at Evaluation unless otherwise noted.  VITALS  BP 133/85 HR 83 SpO2  100%  DIAGNOSTIC FINDINGS:  Nothing listed    PATIENT SURVEYS:  Quick Dash:  QUICK DASH  Please rate your ability do the following activities in the last week by selecting the number below the appropriate response.   Activities Rating  Open a tight or new jar.  1 = No difficulty   Do heavy household chores (e.g., wash walls, floors). 2 = Mild difficulty  Carry a shopping bag or briefcase 2 = Mild difficulty  Wash your back. 2 = Mild difficulty  Use a knife to cut food. 1 = No difficulty   Recreational activities in which you take some force or impact through your arm, shoulder or hand (e.g., golf, hammering, tennis, etc.). 3 = Moderate difficulty  During the past week, to what extent has your arm, shoulder or hand problem interfered with your normal social activities with family, friends, neighbors or groups?  2 = Slightly  During the past week, were you limited in your work or other regular daily activities as a result of your arm, shoulder or hand problem? 1 = Not limited at all  Rate the severity of the following symptoms in the last week: Arm, Shoulder, or hand pain. 2 = Mild  Rate the severity of the following symptoms in the last week: Tingling (pins and needles) in your arm, shoulder or hand. 1 = none  During the past week, how much difficulty have you had sleeping because of the pain in your arm, shoulder or hand?  2 = Mild difficulty   (A QuickDASH score may not be calculated if there is greater than 1 missing item.)  Quick Dash Disability/Symptom Score used online form= 18%  Minimally Clinically Important Difference (MCID): 15-20 points  (Franchignoni, F. et al. (2013). Minimally clinically important difference of the disabilities of the arm, shoulder, and hand  outcome measures (DASH) and its  shortened version (Quick DASH). Journal of Orthopaedic & Sports Physical Therapy, 44(1), 30-39)   COGNITION: Overall cognitive status: Within functional limits for tasks assessed     SENSATION: WFL  POSTURE: Forward head and rounded shoulders     UPPER EXTREMITY ROM:   Active ROM Right eval Left eval  Shoulder flexion    Shoulder extension    Shoulder abduction    Shoulder adduction    Shoulder internal rotation    Shoulder external rotation    Elbow flexion    Elbow extension    Wrist flexion    Wrist extension    Wrist ulnar deviation    Wrist radial deviation    Wrist pronation    Wrist supination    (Blank rows = not tested)  UPPER EXTREMITY MMT:  MMT Right eval Left eval  Shoulder flexion 4+ 4+  Shoulder extension 4 4  Shoulder abduction 4 4  Shoulder adduction    Shoulder internal rotation 4 4  Shoulder external rotation 4 4  Shoulder internal rotation at 90 deg ABD  NT NT  Shoulder external rotation at 90 deg ABD   NT NT  Middle trapezius 4 4  Lower trapezius 4 4  Serratus Anterior    4 4  Elbow flexion    Elbow extension    Wrist flexion    Wrist extension    Wrist ulnar deviation    Wrist radial deviation    Wrist pronation    Wrist supination    Grip strength (lbs)    (Blank rows = not tested)  SHOULDER SPECIAL TESTS: Deferred      JOINT MOBILITY TESTING:  Did not perform    PALPATION:  Medial surface of scapulae TTP                                                                                                                               TREATMENT DATE:  10/17/24:   UBE 2.5 min fwd/2.5 min bwd level 8-6 - PT manually adjusting resistance throughout Levator stretch 2 x 30 seconds each side  OMEGA:   Rows 20# - 3 x 10   Face pull 10# - 3 x 10   Chest press 20# - 3 x 10  Standing B shoulder horizontal abduction 3 x 10 with GTB   Manual:  STM to cervical paraspinals, BUT, levator, suboccipitals x  15 minutes  UT stretch 2 x 30 seconds each side  09/24/24: THEREX   UBE 2.5 min forward an 2.5 MIN backward   Shoulder D2 PNF Flexion Extension  AROM 3 x 10   Shoulder D2 PNF Flexion Extension #1 DB  1 x 10  Serratus Activation at Wall with Foam Roll 2 x 10     MANUAL  Bilateral upper trap trigger point release and massage    PATIENT EDUCATION: Education details: Form and technique for correct performance of exercise and exl Person educated: Patient Education method: Explanation,  Demonstration, Verbal cues, and Handouts Education comprehension: verbalized understanding, returned demonstration, and verbal cues required  HOME EXERCISE PROGRAM:  Access Code: [email or web address] URL: [email or web address] Date: 09/24/2024 Prepared by: Toribio Servant  Exercises - Seated Cervical Sidebending Stretch  - 1 x daily - 7 x weekly - 2-3 reps - 30 sec hold - Seated Cervical Sidebending Stretch (Mirrored)  - 1 x daily - 7 x weekly - 2-3 reps - 30 sec hold - Cat Cow  - 1 x daily - 7 x weekly - 2 sets - 10 reps - 3 sec  hold - Seated Scapular Retraction  - 1 x daily - 7 x weekly - 2 sets - 10 reps - 3 sec  hold - Serratus Activation at Wall with Foam Roll  - 3-4 x weekly - 3 sets - 10 reps - Standing Bent Over Shoulder Row  - 3-4 x weekly - 3 sets - 10 reps - Seated Shoulder Horizontal Abduction - Thumbs Up  - 3-4 x weekly - 3 sets - 10 reps - Shoulder PNF D2 Flexion  - 3-4 x weekly - 3 sets - 10 reps  ASSESSMENT:  CLINICAL IMPRESSION:   Continued PT POC focused on periscapular pain. Session focused on manual stretching as well as introduction to levator stretch for home use and scapular strengthening. Tolerated well with no increase in pain at end of session. She will continue to benefit from skilled PT to improved posture to decreased forward head and rounded shoulders to decrease neck pain and discomfort when performing seated tasks for job.     OBJECTIVE IMPAIRMENTS: decreased  knowledge of condition, decreased strength, impaired flexibility, impaired UE functional use, improper body mechanics, postural dysfunction, obesity, and pain.   ACTIVITY LIMITATIONS: carrying, lifting, bathing, dressing, hygiene/grooming, and caring for others  PARTICIPATION LIMITATIONS: cleaning, laundry, and shopping  PERSONAL FACTORS: Time since onset of injury/illness/exacerbation and 3+ comorbidities: Anxiety, Depression, OSA are also affecting patient's functional outcome.   REHAB POTENTIAL: Good  CLINICAL DECISION MAKING: Stable/uncomplicated  EVALUATION COMPLEXITY: Low   GOALS: Goals reviewed with patient? No  SHORT TERM GOALS: Target date: 09/24/2024  Patient will demonstrate undestanding of home exercise plan by performing exercises correctly with evidence of good carry over with min to no verbal or tactile cues .   Baseline: NT  09/17/24: Performing independently   Goal status: ACHIEVED    2.  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 et al. 2013). Baseline: 18% Goal status: ONGOING   2.  Patient will improve shoulder and periscapular strength by 1/3 grade MMT (ie 4- to 4) for improved posture and muscular function to relieve pain and discomfort from prolonged desk work and from caring for her children.  Baseline: Shoulder Abd R/L 4/4, Shoulder IR at 0 deg R/L 4/4, Mid Trap R/L 4/4, Lower Trap 4/4, Serratus Anterior R/L 4/4  Goal status: ONGOING   3.  Patient will  decrease her periscapular pain to <=3/10 NRPS while performing desk work and child care tasks like lifting  car seat or objects as evidence of improved thoracic function to carry out work and child care tasks without being limited by pain and discomfort.  Baseline: 5-6/10 NRPS of medial scapular border    Goal status: ONGOING       PLAN:  PT FREQUENCY: 1x/week  PT DURATION: 12 weeks  PLANNED INTERVENTIONS: 97164- PT Re-evaluation, 97750- Physical Performance Testing, 97110-Therapeutic exercises, 97530- Therapeutic activity, [id number]- Neuromuscular re-education, 97535- Self Care, 02859- Manual therapy, [id number]- Gait training, [phone]- Canalith repositioning, [id number]- Aquatic Therapy, [phone]- Electrical stimulation (unattended), [phone]- Electrical stimulation (manual), 20560 (1-2 muscles), 20561 (3+ muscles)- Dry Needling, Patient/Family education, Balance training, Stair training, Taping, Joint mobilization, Joint manipulation, Spinal manipulation, Spinal mobilization, Vestibular training, DME instructions, Cryotherapy, and Moist heat  PLAN FOR NEXT SESSION:  Continue with manual: trigger point release of upper traps, suboccipitals, and cervical paraspinals. Finish shoulder ROM and MMT with 90 deg ab IR and ER.  Progress shoulder and scapular strengthening exercises: rhomboid stretch and  prone t's and I's    Maryanne Finder, PT, DPT Physical Therapist - [place]  [facility]

## 2024-10-18 ENCOUNTER — Other Ambulatory Visit: Payer: Self-pay

## 2024-10-18 MED ORDER — TIRZEPATIDE-WEIGHT MANAGEMENT 10 MG/0.5ML ~~LOC~~ SOAJ: 10.0000 mg | Freq: Once | SUBCUTANEOUS | Status: DC

## 2024-10-22 ENCOUNTER — Ambulatory Visit: Admitting: Physical Therapy

## 2024-10-26 LAB — GENECONNECT MOLECULAR SCREEN: Genetic Analysis Overall Interpretation: NEGATIVE

## 2024-10-29 ENCOUNTER — Ambulatory Visit: Admitting: Physical Therapy

## 2024-10-31 NOTE — Addendum Note (Signed)
 Addended by: Amberia Bayless on: 10/31/2024 08:30 AM   Modules accepted: Orders

## 2024-10-31 NOTE — Telephone Encounter (Signed)
MEDICATION PENDED FOR APPROVAL

## 2024-11-01 MED ORDER — TIRZEPATIDE-WEIGHT MANAGEMENT 10 MG/0.5ML ~~LOC~~ SOAJ: 10.0000 mg | SUBCUTANEOUS | 3 refills | Status: DC

## 2024-12-05 ENCOUNTER — Other Ambulatory Visit (HOSPITAL_COMMUNITY): Payer: Self-pay

## 2024-12-05 ENCOUNTER — Telehealth: Payer: Self-pay

## 2024-12-05 NOTE — Telephone Encounter (Signed)
 Pharmacy Patient Advocate Encounter   Received notification from CoverMyMeds that prior authorization for Zepbound  5mg /0.91ml is due for renewal.   Insurance verification completed.   The patient is insured through [facility].   Patient hasn't been seen in your office in a while. Plan requires updated chart notes for PA renewal.

## 2024-12-24 ENCOUNTER — Other Ambulatory Visit: Payer: Self-pay | Admitting: Family

## 2024-12-24 DIAGNOSIS — F909 Attention-deficit hyperactivity disorder, unspecified type: Secondary | ICD-10-CM

## 2024-12-24 MED ORDER — LISDEXAMFETAMINE DIMESYLATE 30 MG PO CAPS: 30.0000 mg | ORAL_CAPSULE | Freq: Every day | ORAL | 0 refills | Status: DC

## 2025-01-11 ENCOUNTER — Telehealth: Admitting: Family

## 2025-01-11 ENCOUNTER — Encounter: Payer: Self-pay | Admitting: Family

## 2025-01-11 VITALS — Wt 230.0 lb

## 2025-01-11 DIAGNOSIS — E66813 Obesity, class 3: Secondary | ICD-10-CM

## 2025-01-11 DIAGNOSIS — Z0000 Encounter for general adult medical examination without abnormal findings: Secondary | ICD-10-CM

## 2025-01-11 DIAGNOSIS — F909 Attention-deficit hyperactivity disorder, unspecified type: Secondary | ICD-10-CM

## 2025-01-11 MED ORDER — LISDEXAMFETAMINE DIMESYLATE 30 MG PO CAPS: 30.0000 mg | ORAL_CAPSULE | Freq: Every day | ORAL | 0 refills | Status: AC

## 2025-01-11 NOTE — Patient Instructions (Signed)
" ° °  Please reference to [employer]govy.[email or web address] manufacture website in regards to cost.   Coupon is online or they can text SAVE to [phone].    [phone]    "

## 2025-01-11 NOTE — Progress Notes (Unsigned)
 Virtual Visit via Video Note  I connected with Kim Garcia on 01/18/25 at  8:00 AM EST by a video enabled telemedicine application and verified that I am speaking with the correct person using two identifiers. Location patient: home Location provider: work  Persons participating in the virtual visit: patient, provider  I discussed the limitations of evaluation and management by telemedicine and the availability of in person appointments. The patient expressed understanding and agreed to proceed.  HPI: Follow-up for medication.  She feels well today.  No new complaints  She is not on Zepbound  10 mg weekly due to insurance, costs  She remains compliant with Vyvanse  30 mg daily and feels dose is working well.   She is on [facility] compounded lirzepatide  She has not had side effects. She has decreased appetite.   ROS: See pertinent positives and negatives per HPI.  EXAM:  VITALS per patient if applicable: Wt 230 lb (104.3 kg)   BMI 42.75 kg/m  BP Readings from Last 3 Encounters:  09/06/24 130/85  06/22/24 130/76  05/25/24 130/80   Wt Readings from Last 3 Encounters:  01/11/25 230 lb (104.3 kg)  09/06/24 237 lb (107.5 kg)  06/22/24 230 lb 3.2 oz (104.4 kg)    GENERAL: alert, oriented, appears well and in no acute distress  HEENT: atraumatic, conjunttiva clear, no obvious abnormalities on inspection of external nose and ears  NECK: normal movements of the head and neck  LUNGS: on inspection no signs of respiratory distress, breathing rate appears normal, no obvious gross SOB, gasping or wheezing  CV: no obvious cyanosis  MS: moves all visible extremities without noticeable abnormality  PSYCH/NEURO: pleasant and cooperative, no obvious depression or anxiety, speech and thought processing grossly intact  ASSESSMENT AND PLAN: Class 3 severe obesity with serious comorbidity and body mass index (BMI) of 45.0 to 49.9 in adult, unspecified obesity type Kim Garcia) Assessment &  Plan: Chronic, Stable.  She is following with [facility] compounded lirzepatide.  Counseled patient that she may able to be prescribed Wegovy safer, and that a lower cost may be manufacturer.  Provided information on AVS.   Annual physical exam  Attention deficit hyperactivity disorder (ADHD), unspecified ADHD type Assessment & Plan: Chronic, stable. Continue vyvanse  30mg  every day.  Orders: -     Lisdexamfetamine  Dimesylate; Take 1 capsule (30 mg total) by mouth daily.  Dispense: 30 capsule; Refill: 0 -     Lisdexamfetamine  Dimesylate; Take 1 capsule (30 mg total) by mouth daily.  Dispense: 30 capsule; Refill: 0 -     Lisdexamfetamine  Dimesylate; Take 1 capsule (30 mg total) by mouth daily.  Dispense: 30 capsule; Refill: 0     -we discussed possible serious and likely etiologies, options for evaluation and workup, limitations of telemedicine visit vs in person visit, treatment, treatment risks and precautions. Pt prefers to treat via telemedicine empirically rather then risking or undertaking an in person visit at this moment.    I discussed the assessment and treatment plan with the patient. The patient was provided an opportunity to ask questions and all were answered. The patient agreed with the plan and demonstrated an understanding of the instructions.   The patient was advised to call back or seek an in-person evaluation if the symptoms worsen or if the condition fails to improve as anticipated.  Advised if desired AVS can be mailed or viewed via MyChart if Mychart user.   Rollene Northern, FNP

## 2025-01-18 NOTE — Assessment & Plan Note (Addendum)
 Chronic, Stable.  She is following with [facility] compounded lirzepatide.  Counseled patient that she may able to be prescribed Wegovy safer, and that a lower cost may be manufacturer.  Provided information on AVS.

## 2025-01-18 NOTE — Assessment & Plan Note (Signed)
 Chronic, stable. Continue vyvanse  30mg  every day.
